# Patient Record
Sex: Female | Born: 1945 | Race: White | Hispanic: No | Marital: Married | Smoking: Former smoker
Health system: Southern US, Community
[De-identification: ages and names within clinical notes are randomized; demographics above are authoritative.]

## PROBLEM LIST (undated history)

## (undated) DIAGNOSIS — J309 Allergic rhinitis, unspecified: Secondary | ICD-10-CM

## (undated) DIAGNOSIS — M949 Disorder of cartilage, unspecified: Secondary | ICD-10-CM

## (undated) DIAGNOSIS — Z803 Family history of malignant neoplasm of breast: Secondary | ICD-10-CM

## (undated) DIAGNOSIS — K5730 Diverticulosis of large intestine without perforation or abscess without bleeding: Secondary | ICD-10-CM

## (undated) DIAGNOSIS — T7840XA Allergy, unspecified, initial encounter: Secondary | ICD-10-CM

## (undated) DIAGNOSIS — G4730 Sleep apnea, unspecified: Secondary | ICD-10-CM

## (undated) DIAGNOSIS — R011 Cardiac murmur, unspecified: Secondary | ICD-10-CM

## (undated) DIAGNOSIS — Z8679 Personal history of other diseases of the circulatory system: Secondary | ICD-10-CM

## (undated) DIAGNOSIS — M8580 Other specified disorders of bone density and structure, unspecified site: Secondary | ICD-10-CM

## (undated) DIAGNOSIS — J189 Pneumonia, unspecified organism: Secondary | ICD-10-CM

## (undated) DIAGNOSIS — M810 Age-related osteoporosis without current pathological fracture: Secondary | ICD-10-CM

## (undated) DIAGNOSIS — I10 Essential (primary) hypertension: Secondary | ICD-10-CM

## (undated) DIAGNOSIS — R911 Solitary pulmonary nodule: Secondary | ICD-10-CM

## (undated) DIAGNOSIS — I272 Pulmonary hypertension, unspecified: Secondary | ICD-10-CM

## (undated) DIAGNOSIS — I4819 Other persistent atrial fibrillation: Principal | ICD-10-CM

## (undated) DIAGNOSIS — D72819 Decreased white blood cell count, unspecified: Secondary | ICD-10-CM

## (undated) DIAGNOSIS — I071 Rheumatic tricuspid insufficiency: Secondary | ICD-10-CM

## (undated) DIAGNOSIS — S42401A Unspecified fracture of lower end of right humerus, initial encounter for closed fracture: Secondary | ICD-10-CM

## (undated) DIAGNOSIS — J329 Chronic sinusitis, unspecified: Secondary | ICD-10-CM

## (undated) DIAGNOSIS — H269 Unspecified cataract: Secondary | ICD-10-CM

## (undated) DIAGNOSIS — I340 Nonrheumatic mitral (valve) insufficiency: Secondary | ICD-10-CM

## (undated) DIAGNOSIS — M899 Disorder of bone, unspecified: Secondary | ICD-10-CM

## (undated) DIAGNOSIS — G4733 Obstructive sleep apnea (adult) (pediatric): Secondary | ICD-10-CM

## (undated) HISTORY — DX: Personal history of other diseases of the circulatory system: Z86.79

## (undated) HISTORY — DX: Sleep apnea, unspecified: G47.30

## (undated) HISTORY — DX: Unspecified fracture of lower end of right humerus, initial encounter for closed fracture: S42.401A

## (undated) HISTORY — DX: Pneumonia, unspecified organism: J18.9

## (undated) HISTORY — DX: Diverticulosis of large intestine without perforation or abscess without bleeding: K57.30

## (undated) HISTORY — DX: Allergy, unspecified, initial encounter: T78.40XA

## (undated) HISTORY — DX: Pulmonary hypertension, unspecified: I27.20

## (undated) HISTORY — DX: Solitary pulmonary nodule: R91.1

## (undated) HISTORY — DX: Rheumatic tricuspid insufficiency: I07.1

## (undated) HISTORY — DX: Chronic sinusitis, unspecified: J32.9

## (undated) HISTORY — DX: Unspecified cataract: H26.9

## (undated) HISTORY — PX: BREAST BIOPSY: SHX20

## (undated) HISTORY — PX: DILATION AND CURETTAGE OF UTERUS: SHX78

## (undated) HISTORY — DX: Other persistent atrial fibrillation: I48.19

## (undated) HISTORY — DX: Other specified disorders of bone density and structure, unspecified site: M85.80

## (undated) HISTORY — DX: Disorder of cartilage, unspecified: M94.9

## (undated) HISTORY — DX: Allergic rhinitis, unspecified: J30.9

## (undated) HISTORY — DX: Decreased white blood cell count, unspecified: D72.819

## (undated) HISTORY — PX: TONSILLECTOMY: SHX5217

## (undated) HISTORY — DX: Age-related osteoporosis without current pathological fracture: M81.0

## (undated) HISTORY — DX: Family history of malignant neoplasm of breast: Z80.3

## (undated) HISTORY — DX: Nonrheumatic mitral (valve) insufficiency: I34.0

## (undated) HISTORY — PX: TUBAL LIGATION: SHX77

## (undated) HISTORY — DX: Essential (primary) hypertension: I10

## (undated) HISTORY — PX: FOOT SURGERY: SHX648

## (undated) HISTORY — DX: Cardiac murmur, unspecified: R01.1

## (undated) HISTORY — DX: Obstructive sleep apnea (adult) (pediatric): G47.33

## (undated) HISTORY — DX: Disorder of bone, unspecified: M89.9

---

## 2003-09-21 ENCOUNTER — Encounter (INDEPENDENT_AMBULATORY_CARE_PROVIDER_SITE_OTHER): Payer: Self-pay | Admitting: Family Medicine

## 2003-09-21 LAB — HM COLONOSCOPY

## 2006-02-18 ENCOUNTER — Ambulatory Visit: Payer: Self-pay | Admitting: Family Medicine

## 2006-03-11 ENCOUNTER — Ambulatory Visit: Payer: Self-pay | Admitting: Family Medicine

## 2007-02-24 ENCOUNTER — Ambulatory Visit: Payer: Self-pay | Admitting: Family Medicine

## 2007-02-24 DIAGNOSIS — J189 Pneumonia, unspecified organism: Secondary | ICD-10-CM | POA: Insufficient documentation

## 2007-02-24 DIAGNOSIS — K5730 Diverticulosis of large intestine without perforation or abscess without bleeding: Secondary | ICD-10-CM | POA: Insufficient documentation

## 2007-02-24 DIAGNOSIS — L988 Other specified disorders of the skin and subcutaneous tissue: Secondary | ICD-10-CM | POA: Insufficient documentation

## 2007-05-03 ENCOUNTER — Ambulatory Visit: Payer: Self-pay | Admitting: Family Medicine

## 2007-05-03 LAB — CONVERTED CEMR LAB
Cholesterol: 175 mg/dL (ref 0–200)
HDL: 65.7 mg/dL (ref >39.0)
TSH: 2.62 microintl units/mL (ref 0.35–5.50)
Total CHOL/HDL Ratio: 2.7

## 2008-02-08 ENCOUNTER — Ambulatory Visit: Payer: Self-pay | Admitting: *Deleted

## 2008-02-08 DIAGNOSIS — R519 Headache, unspecified: Secondary | ICD-10-CM | POA: Insufficient documentation

## 2008-02-08 DIAGNOSIS — R51 Headache: Secondary | ICD-10-CM | POA: Insufficient documentation

## 2008-02-08 DIAGNOSIS — I10 Essential (primary) hypertension: Secondary | ICD-10-CM | POA: Insufficient documentation

## 2008-02-09 LAB — CONVERTED CEMR LAB
Basophils Absolute: 0 10*3/uL (ref 0.0–0.1)
Basophils Relative: 0.4 % (ref 0.0–3.0)
CO2: 31 meq/L (ref 19–32)
Calcium: 9.6 mg/dL (ref 8.4–10.5)
Creatinine, Ser: 0.8 mg/dL (ref 0.4–1.2)
Glucose, Bld: 86 mg/dL (ref 70–99)
Hemoglobin: 14.2 g/dL (ref 12.0–15.0)
Lymphocytes Relative: 32 % (ref 12.0–46.0)
MCHC: 35 g/dL (ref 30.0–36.0)
Monocytes Relative: 8.7 % (ref 3.0–12.0)
Neutro Abs: 2.4 10*3/uL (ref 1.4–7.7)
RBC: 4.59 M/uL (ref 3.87–5.11)

## 2008-02-15 ENCOUNTER — Ambulatory Visit: Payer: Self-pay | Admitting: *Deleted

## 2008-02-15 DIAGNOSIS — D72819 Decreased white blood cell count, unspecified: Secondary | ICD-10-CM | POA: Insufficient documentation

## 2008-03-14 ENCOUNTER — Ambulatory Visit: Payer: Self-pay | Admitting: *Deleted

## 2008-03-14 DIAGNOSIS — R109 Unspecified abdominal pain: Secondary | ICD-10-CM | POA: Insufficient documentation

## 2008-03-14 LAB — CONVERTED CEMR LAB
Blood in Urine, dipstick: NEGATIVE
Ketones, urine, test strip: NEGATIVE
Nitrite: NEGATIVE
Protein, U semiquant: NEGATIVE
Urobilinogen, UA: 0.2

## 2008-03-21 ENCOUNTER — Ambulatory Visit: Payer: Self-pay | Admitting: *Deleted

## 2008-03-21 LAB — CONVERTED CEMR LAB
Basophils Absolute: 0.1 10*3/uL (ref 0.0–0.1)
Basophils Relative: 1.2 % (ref 0.0–3.0)
CO2: 32 meq/L (ref 19–32)
Calcium: 9 mg/dL (ref 8.4–10.5)
Creatinine, Ser: 0.7 mg/dL (ref 0.4–1.2)
Eosinophils Absolute: 0.1 10*3/uL (ref 0.0–0.7)
Hemoglobin: 14.4 g/dL (ref 12.0–15.0)
MCHC: 35.4 g/dL (ref 30.0–36.0)
MCV: 88.3 fL (ref 78.0–100.0)
Neutro Abs: 4.8 10*3/uL (ref 1.4–7.7)
RBC: 4.62 M/uL (ref 3.87–5.11)

## 2008-07-02 ENCOUNTER — Ambulatory Visit: Payer: Self-pay | Admitting: *Deleted

## 2008-07-09 ENCOUNTER — Encounter (INDEPENDENT_AMBULATORY_CARE_PROVIDER_SITE_OTHER): Payer: Self-pay | Admitting: *Deleted

## 2008-07-16 ENCOUNTER — Encounter (INDEPENDENT_AMBULATORY_CARE_PROVIDER_SITE_OTHER): Payer: Self-pay | Admitting: *Deleted

## 2008-07-26 ENCOUNTER — Ambulatory Visit: Payer: Self-pay | Admitting: Internal Medicine

## 2008-07-26 DIAGNOSIS — J018 Other acute sinusitis: Secondary | ICD-10-CM | POA: Insufficient documentation

## 2008-08-15 ENCOUNTER — Ambulatory Visit: Payer: Self-pay | Admitting: *Deleted

## 2008-08-15 DIAGNOSIS — J329 Chronic sinusitis, unspecified: Secondary | ICD-10-CM | POA: Insufficient documentation

## 2008-08-19 DIAGNOSIS — J309 Allergic rhinitis, unspecified: Secondary | ICD-10-CM | POA: Insufficient documentation

## 2008-10-11 ENCOUNTER — Ambulatory Visit: Payer: Self-pay | Admitting: Internal Medicine

## 2008-10-11 DIAGNOSIS — M899 Disorder of bone, unspecified: Secondary | ICD-10-CM | POA: Insufficient documentation

## 2008-10-11 DIAGNOSIS — M949 Disorder of cartilage, unspecified: Secondary | ICD-10-CM

## 2008-10-12 ENCOUNTER — Encounter: Payer: Self-pay | Admitting: Internal Medicine

## 2008-10-12 ENCOUNTER — Telehealth (INDEPENDENT_AMBULATORY_CARE_PROVIDER_SITE_OTHER): Payer: Self-pay | Admitting: *Deleted

## 2008-12-25 ENCOUNTER — Telehealth: Payer: Self-pay | Admitting: Internal Medicine

## 2009-03-25 ENCOUNTER — Ambulatory Visit (HOSPITAL_BASED_OUTPATIENT_CLINIC_OR_DEPARTMENT_OTHER): Admission: RE | Admit: 2009-03-25 | Discharge: 2009-03-25 | Payer: Self-pay | Admitting: Internal Medicine

## 2009-03-25 ENCOUNTER — Ambulatory Visit: Payer: Self-pay | Admitting: Diagnostic Radiology

## 2009-03-25 ENCOUNTER — Telehealth: Payer: Self-pay | Admitting: Internal Medicine

## 2009-03-25 ENCOUNTER — Ambulatory Visit: Payer: Self-pay | Admitting: Internal Medicine

## 2009-03-25 ENCOUNTER — Ambulatory Visit: Payer: Self-pay | Admitting: Cardiology

## 2009-03-25 DIAGNOSIS — R0789 Other chest pain: Secondary | ICD-10-CM | POA: Insufficient documentation

## 2009-03-25 DIAGNOSIS — R011 Cardiac murmur, unspecified: Secondary | ICD-10-CM | POA: Insufficient documentation

## 2009-04-12 ENCOUNTER — Ambulatory Visit: Payer: Self-pay | Admitting: Internal Medicine

## 2009-04-12 ENCOUNTER — Encounter: Payer: Self-pay | Admitting: Cardiology

## 2009-04-12 ENCOUNTER — Ambulatory Visit (HOSPITAL_COMMUNITY): Admission: RE | Admit: 2009-04-12 | Discharge: 2009-04-12 | Payer: Self-pay | Admitting: Internal Medicine

## 2009-04-12 ENCOUNTER — Ambulatory Visit: Payer: Self-pay

## 2009-06-07 ENCOUNTER — Ambulatory Visit (HOSPITAL_BASED_OUTPATIENT_CLINIC_OR_DEPARTMENT_OTHER): Admission: RE | Admit: 2009-06-07 | Discharge: 2009-06-07 | Payer: Self-pay | Admitting: Internal Medicine

## 2009-06-07 ENCOUNTER — Ambulatory Visit: Payer: Self-pay | Admitting: Diagnostic Radiology

## 2009-06-07 ENCOUNTER — Ambulatory Visit: Payer: Self-pay | Admitting: Internal Medicine

## 2009-06-07 ENCOUNTER — Telehealth: Payer: Self-pay | Admitting: Internal Medicine

## 2009-06-07 DIAGNOSIS — R059 Cough, unspecified: Secondary | ICD-10-CM | POA: Insufficient documentation

## 2009-06-07 DIAGNOSIS — R05 Cough: Secondary | ICD-10-CM

## 2009-06-22 DIAGNOSIS — R911 Solitary pulmonary nodule: Secondary | ICD-10-CM

## 2009-06-22 HISTORY — DX: Solitary pulmonary nodule: R91.1

## 2009-06-27 ENCOUNTER — Ambulatory Visit: Payer: Self-pay | Admitting: Diagnostic Radiology

## 2009-06-27 ENCOUNTER — Ambulatory Visit (HOSPITAL_BASED_OUTPATIENT_CLINIC_OR_DEPARTMENT_OTHER): Admission: RE | Admit: 2009-06-27 | Discharge: 2009-06-27 | Payer: Self-pay | Admitting: Internal Medicine

## 2009-06-27 ENCOUNTER — Ambulatory Visit: Payer: Self-pay | Admitting: Internal Medicine

## 2009-06-27 ENCOUNTER — Telehealth: Payer: Self-pay | Admitting: Internal Medicine

## 2009-07-11 ENCOUNTER — Encounter: Payer: Self-pay | Admitting: Internal Medicine

## 2009-07-19 ENCOUNTER — Encounter: Payer: Self-pay | Admitting: Internal Medicine

## 2009-08-02 ENCOUNTER — Ambulatory Visit: Payer: Self-pay | Admitting: Internal Medicine

## 2009-08-02 DIAGNOSIS — J984 Other disorders of lung: Secondary | ICD-10-CM | POA: Insufficient documentation

## 2009-10-22 ENCOUNTER — Ambulatory Visit: Payer: Self-pay | Admitting: Internal Medicine

## 2010-01-27 ENCOUNTER — Ambulatory Visit: Payer: Self-pay | Admitting: Internal Medicine

## 2010-01-27 DIAGNOSIS — H5310 Unspecified subjective visual disturbances: Secondary | ICD-10-CM | POA: Insufficient documentation

## 2010-01-27 LAB — CONVERTED CEMR LAB
BUN: 12 mg/dL (ref 6–23)
CO2: 31 meq/L (ref 19–32)
Calcium: 9.6 mg/dL (ref 8.4–10.5)
Glucose, Bld: 84 mg/dL (ref 70–99)
TSH: 1.974 microintl units/mL (ref 0.350–4.500)

## 2010-01-29 ENCOUNTER — Telehealth: Payer: Self-pay | Admitting: Internal Medicine

## 2010-03-18 ENCOUNTER — Telehealth: Payer: Self-pay | Admitting: Internal Medicine

## 2010-05-06 ENCOUNTER — Telehealth: Payer: Self-pay | Admitting: Internal Medicine

## 2010-05-06 ENCOUNTER — Ambulatory Visit: Payer: Self-pay | Admitting: Diagnostic Radiology

## 2010-05-06 ENCOUNTER — Ambulatory Visit (HOSPITAL_BASED_OUTPATIENT_CLINIC_OR_DEPARTMENT_OTHER): Admission: RE | Admit: 2010-05-06 | Discharge: 2010-05-06 | Payer: Self-pay | Admitting: Internal Medicine

## 2010-05-08 ENCOUNTER — Telehealth: Payer: Self-pay | Admitting: Internal Medicine

## 2010-06-30 ENCOUNTER — Ambulatory Visit
Admission: RE | Admit: 2010-06-30 | Discharge: 2010-06-30 | Payer: Self-pay | Source: Home / Self Care | Attending: Internal Medicine | Admitting: Internal Medicine

## 2010-07-03 ENCOUNTER — Encounter: Payer: Self-pay | Admitting: Internal Medicine

## 2010-07-03 LAB — CONVERTED CEMR LAB
BUN: 9 mg/dL (ref 6–23)
CRP, High Sensitivity: 0.5
Calcium: 9.3 mg/dL (ref 8.4–10.5)
Creatinine, Ser: 0.73 mg/dL (ref 0.40–1.20)
HDL: 72 mg/dL (ref >39)
LDL Cholesterol: 87 mg/dL (ref 0–99)
Triglycerides: 52 mg/dL (ref <150)

## 2010-07-04 ENCOUNTER — Encounter: Payer: Self-pay | Admitting: Internal Medicine

## 2010-07-14 ENCOUNTER — Encounter: Payer: Self-pay | Admitting: Internal Medicine

## 2010-07-15 ENCOUNTER — Encounter: Payer: Self-pay | Admitting: Internal Medicine

## 2010-07-20 LAB — CONVERTED CEMR LAB
AST: 38 units/L — ABNORMAL HIGH (ref 0–37)
Albumin: 4.2 g/dL (ref 3.5–5.2)
Alkaline Phosphatase: 50 units/L (ref 39–117)
BUN: 10 mg/dL (ref 6–23)
Basophils Absolute: 0.1 10*3/uL (ref 0.0–0.1)
Basophils Relative: 1.9 % (ref 0.0–3.0)
Bilirubin, Direct: 0.1 mg/dL (ref 0.0–0.3)
CO2: 31 meq/L (ref 19–32)
Calcium: 9.4 mg/dL (ref 8.4–10.5)
Creatinine, Ser: 0.7 mg/dL (ref 0.4–1.2)
Eosinophils Absolute: 0 10*3/uL (ref 0.0–0.7)
HCT: 41.1 % (ref 36.0–46.0)
Hemoglobin: 14 g/dL (ref 12.0–15.0)
Lymphs Abs: 1.2 10*3/uL (ref 0.7–4.0)
MCHC: 34.2 g/dL (ref 30.0–36.0)
MCV: 88.1 fL (ref 78.0–100.0)
Monocytes Absolute: 0.3 10*3/uL (ref 0.1–1.0)
Neutro Abs: 1.9 10*3/uL (ref 1.4–7.7)
Pap Smear: NORMAL
RDW: 12.2 % (ref 11.5–14.6)
Total Bilirubin: 1 mg/dL (ref 0.3–1.2)
Total CHOL/HDL Ratio: 3
Triglycerides: 59 mg/dL (ref 0.0–149.0)

## 2010-07-22 NOTE — Progress Notes (Signed)
Summary: Additional CT ?  Phone Note Call from Patient   Caller: Patient Call For: D. Thomos Lemons DO Summary of Call: Pt left voice message stating that she had additional questions about her CT result. Left message on machine tor pt to return my call.  Nicki Guadalajara Fergerson CMA Duncan Dull)  May 08, 2010 8:44 AM   Follow-up for Phone Call        Pt called back to question thyroid nodule. States she was never told she had a nodule on her thyroid but was aware of a lung nodule.  Advised pt per radiology report there was no imaging of her thyroid and it was the lung nodules that were stable.  Pt questioned what is a lung nodule and why do they develop.  Advised pt it is a growth on her lung, not sure what causes them. Per report there have been no changes from previous CT and they are stable. Not concerning for cancer at this time.  Nicki Guadalajara Fergerson CMA Duncan Dull)  May 08, 2010 9:14 AM

## 2010-07-22 NOTE — Progress Notes (Signed)
Summary: Hydrochlorithiazide Refill  Phone Note Refill Request Message from:  Fax from Pharmacy on March 18, 2010 1:57 PM  Refills Requested: Medication #1:  HYDROCHLOROTHIAZIDE 12.5 MG  CAPS one cap by mouth once daily   Dosage confirmed as above?Dosage Confirmed   Brand Name Necessary? No   Supply Requested: 3 months   Last Refilled: 12/23/2009  Method Requested: Electronic Next Appointment Scheduled: 06-30-2010 Dr Artist Pais  Initial call taken by: Roselle Locus,  March 18, 2010 1:57 PM  Follow-up for Phone Call        call placed to  patient at [phone] to verify which pharmacy she uses. patients husband, stated she was on another line and he was able to verify [facility] [address] Follow-up by: Glendell Docker CMA,  March 18, 2010 3:01 PM    Prescriptions: HYDROCHLOROTHIAZIDE 12.5 MG  CAPS (HYDROCHLOROTHIAZIDE) one cap by mouth once daily  #90 x 1   Entered by:   Glendell Docker CMA   Authorized by:   D. Thomos Lemons DO   Signed by:   Glendell Docker CMA on 03/18/2010   Method used:   [facility]  [address] [id number]       [address]       [place], [place]  [address]       Ph: [phone] or [phone]       Fax: [phone]   RxID:   [id number]

## 2010-07-22 NOTE — Assessment & Plan Note (Signed)
Summary: 6 month follow up/mhf   Vital Signs:  Patient profile:   65 year old Alvarez Height:      65 inches Weight:      156.50 pounds BMI:     26.14 O2 Sat:      99 % on Room air Temp:     98.1 degrees F oral Pulse rate:   54 / minute Pulse rhythm:   regular Resp:     20 per minute BP sitting:   120 / 70  (right arm) Cuff size:   regular  Vitals Entered By: Glendell Docker CMA (January 27, 2010 10:35 AM)  O2 Flow:  Room air CC: Rm 2- 6 Month Follow up  Is Patient Diabetic? No Pain Assessment Patient in pain? no       Does patient need assistance? Functional Status Self care Ambulation Normal Comments no concerns   Primary Care Provider:  DThomos Lemons DO  CC:  Rm 2- 6 Month Follow up .  History of Present Illness: 65 y/o white Alvarez for f/u she c/o intermittent headaches more noticeable near temporal area her sinuses still not clear AM congestion  pulm nodule - no new pulm symptoms  htn - stable  she c/o prominent bone and end of collar bone.  prev eval by ortho.  not sure when MRI was done  Preventive Screening-Counseling & Management  Alcohol-Tobacco     Smoking Status: quit  Allergies: 1)  ! Naprosyn 2)  ! Cipro  Past History:  Past Medical History: Current Problems:  HYPERTENSION, BENIGN ESSENTIAL (ICD-401.1) OSTEOPENIA (ICD-733.90)  ALLERGIC RHINITIS (ICD-477.9)     SINUSITIS (ICD-473.9) LEUKOCYTOPENIA UNSPECIFIED (ICD-288.50)  PNEUMONIA (ICD-486) DISORDER, SKIN NEC (ICD-709.8) FAMILY HISTORY BREAST CANCER 1ST DEGREE RELATIVE <50 (ICD-V16.3) DIVERTICULOSIS, COLON (ICD-562.10) heart murmur - dx as child - s/p rheumatic fever  Small subpleual nodules  - CT of chest 06/2009  -4 mm subpleural nodule in the superior segment of the right lower  lobe   Past Surgical History: Foot Surgery Tubal ligation  D & C tonsillectomy   breast biopsy - benign       Social History: Married  2 children Quit tobacco 1976 - smoked 2 ppd x  15 Retired- Producer, television/film/video  Alcohol Use - yes         Physical Exam  General:  alert, well-developed, and well-nourished.   Lungs:  normal respiratory effort, normal breath sounds, no crackles, and no wheezes.   Heart:  normal rate, regular rhythm, and no gallop.   Abdomen:  soft, non-tender, and normal bowel sounds.   Msk:  left AC joint prominence / cyst Extremities:  No lower extremity edema  Psych:  normally interactive, good eye contact, not anxious appearing, and not depressed appearing.     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   we discussed risks of missing lung cancer  Problem # 2:  HYPERTENSION, BENIGN ESSENTIAL (ICD-401.1) Assessment: Unchanged  Her updated medication list for this problem includes:    Hydrochlorothiazide 12.5 Mg Caps (Hydrochlorothiazide) ..... One cap by mouth once daily  Orders: T-Basic Metabolic Panel [phone]) T-TSH [phone])  BP today: 120/70 Prior BP: 114/70 (10/22/2009)  Prior 10 Yr Risk Heart Disease: 3 % (10/11/2008)  Labs Reviewed: K+: 4.3 (10/11/2008) Creat: : 0.7 (10/11/2008)   Chol: 184 (10/11/2008)   HDL: 59.80 (10/11/2008)   LDL: 112 (10/11/2008)   TG: 59.0 (  10/11/2008)  Problem # 3:  VISUAL CHANGES (ICD-368.10)  she reports intermittent visual changes on left eye.  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 2 sprays each nostril once daily  Complete Medication List: 1)  Centrum Tabs (Multiple vitamins-minerals) .... Take 1 tablet by mouth once a day 2)  Fish Oil 1200 Mg  Caps (Omega-3 fatty acids) .... 2 tabs daily 3)  Calcium 500 Mg Tabs (Calcium) .... Take 1 tablet by mouth two times a day 4)  Hydrochlorothiazide 12.5 Mg Caps (Hydrochlorothiazide) .... One cap by mouth once daily 5)  Estrace 0.1 Mg/gm Crea (Estradiol) .... As directed 6)  Zostavax 28413 Unt/0.30ml Solr (Zoster vaccine live) .... Administer vaccine x 1 7)  Nasonex 50 Mcg/act Susp (Mometasone furoate) .... 2 sprays each nostril once daily  Patient Instructions: 1)  Please schedule a follow-up appointment in 6 months. 2)  Forward copy of previous left shoulder MRI to our office Prescriptions: NASONEX 50 MCG/ACT SUSP (MOMETASONE FUROATE) 2 sprays each nostril once daily  #3 month x 1   Entered and Authorized by:   D. Thomos Lemons DO   Signed by:   D. Thomos Lemons DO on 01/27/2010   Method used:   [facility]  [address] [id number]       [address]       [place], [place]  [address]       Ph: [phone] or [phone]       Fax: [phone]   RxID:   [id number] HYDROCHLOROTHIAZIDE 12.5 MG  CAPS (HYDROCHLOROTHIAZIDE) one cap by mouth once daily  #90 x 1   Entered and Authorized by:   D. Thomos Lemons DO   Signed by:   D. Thomos Lemons DO on 01/27/2010   Method used:   [facility]        [facility]  [address] [id number]       [address]       [place], [place]  [address]       Ph: [phone] or [phone]       Fax: [phone]   RxID:   [id number] NASONEX 50 MCG/ACT SUSP (MOMETASONE FUROATE) 2 sprays each nostril once daily  #1 x 5   Entered and Authorized by:   D. Thomos Lemons DO   Signed by:   D. Thomos Lemons DO on 01/27/2010   Method used:   [facility]        [facility]  [address] [id number]       [address]       [place], [place]  [address]       Ph: [phone] or [phone]       Fax: [phone]   RxID:   [id number]   Current Allergies (reviewed today): !  NAPROSYN ! CIPRO

## 2010-07-22 NOTE — Progress Notes (Signed)
Summary: CT Results  Phone Note Outgoing Call   Summary of Call: call pt - CT of chest confirms resolution of prev pna.  thyroid nodule is stable.   no further surveillance CT scans recommended Initial call taken by: D. Thomos Lemons DO,  May 06, 2010 2:59 PM  Follow-up for Phone Call        call placed to patient at [phone] , no answer. A detailed voice message was left informing patient per Dr Artist Pais instructions. Message was left for patient to call if any questions Follow-up by: Glendell Docker CMA,  May 07, 2010 9:31 AM

## 2010-07-22 NOTE — Progress Notes (Signed)
  Phone Note Call from Patient   Caller: Patient Summary of Call: Pt. was seen today by Dr.Yoo and states that she went to pick her Rx.(Avelox) up from the pharmacy and states that it will cost her over 300 dollars to get this med. She would like something cheaper be called in for her. Call pt. back and let her know [phone] Initial call taken by: Michaelle Copas,  June 27, 2009 11:01 AM  Follow-up for Phone Call        pt. called back and stated she was wrong about the cost of her Avelox. It is going to cost her 125, and that is fine with her so Dr.Yoo can just leave that med. called in for her and she will go pick it up. Thank you Follow-up by: Michaelle Copas,  June 27, 2009 11:38 AM

## 2010-07-22 NOTE — Assessment & Plan Note (Signed)
Summary: 1 month follow up/mhf   Vital Signs:  Patient profile:   65 year old female Weight:      153 pounds BMI:     26.36 O2 Sat:      98 % on Room air Temp:     97.7 degrees F oral Pulse rate:   54 / minute Pulse rhythm:   regular Resp:     16 per minute BP sitting:   120 / 80  (right arm) Cuff size:   regular  Vitals Entered By: Glendell Docker CMA (August 02, 2009 10:19 AM)  O2 Flow:  Room air  Primary Care Provider:  D. Thomos Lemons DO  CC:  1 Month Follow up.  History of Present Illness: 1 Month Follow up   65 y/o white female with prev pna for f/u she finished abx as directed. avelox was hard to take.  it caused some dizziness but she finished rx mild intermittent cough but overall much better  prev CT of chest 06/27/2009 reviewed:   Persistent patchy density at the right base posteriorly consistent with persistent pneumonia.  No sign of an underlying or associated mass lesion.   4 mm subpleural nodule in the superior segment of the right lower lobe on image 22.  If the patient is at low risk of bronchogenic carcinoma, follow-up in 12 months would be suggested to ensure stability.  If the patient is at high risk, 79-month follow-up would be suggested.  Allergies: 1)  ! Naprosyn 2)  ! Cipro  Past History:  Past Medical History: Current Problems:  HYPERTENSION, BENIGN ESSENTIAL (ICD-401.1) OSTEOPENIA (ICD-733.90)  ALLERGIC RHINITIS (ICD-477.9)   SINUSITIS (ICD-473.9) LEUKOCYTOPENIA UNSPECIFIED (ICD-288.50) PNEUMONIA (ICD-486) DISORDER, SKIN NEC (ICD-709.8) FAMILY HISTORY BREAST CANCER 1ST DEGREE RELATIVE <50 (ICD-V16.3) DIVERTICULOSIS, COLON (ICD-562.10) heart murmur - dx as child - s/p rheumatic fever  Past Surgical History: Foot Surgery Tubal ligation  D & C tonsillectomy breast biopsy - benign      Family History: Lung cancer - mother - smoker CABG - mother, age 5 COPD - father - smoker Family History High cholesterol Family History  Hypertension         Social History: Married  2 children Quit tobacco 1976 - smoked 2 ppd x 15 Retired- Producer, television/film/video  Alcohol Use - yes      Physical Exam  General:  alert, well-developed, and well-nourished.   Lungs:  normal respiratory effort, normal breath sounds, no crackles, and no wheezes.   Heart:  normal rate, regular rhythm, and no gallop.     Impression & Recommendations:  Problem # 1:  PNEUMONIA (ICD-486) Assessment Improved She still has intermittent cough.  worse in AM.  I suspect reflux may be trigger.  trial of OTC zantac 150 two times a day x 2 weeks  The following medications were removed from the medication list:    Avelox 400 Mg Tabs (Moxifloxacin hcl) ..... 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  she was smoker.  repeat CT of chest in 1 yr  Future Orders: Radiology Referral (Radiology) ... 01/30/2010  Complete Medication List: 1)  Centrum Tabs (Multiple vitamins-minerals) .... Take 1 tablet by mouth once a day 2)  Fish Oil 1200 Mg Caps (Omega-3 fatty acids) .... 2 tabs daily 3)  Calcium Antacid Ultra Max St 1000 Mg Chew (Calcium carbonate antacid) .... 2 daily 4)  Hydrochlorothiazide 12.5 Mg Caps (  Hydrochlorothiazide) .... One cap by mouth once daily 5)  Estrace 0.1 Mg/gm Crea (Estradiol) .... As directed 6)  Ranitidine Hcl 150 Mg Caps (Ranitidine hcl) .... One by mouth two times a day 7)  Zostavax [address] Unt/0.69ml Solr (Zoster vaccine live) .... Administer vaccine x 1  Patient Instructions: 1)  Please schedule a follow-up appointment in 6 months. Prescriptions: ZOSTAVAX 60454 UNT/0.65ML SOLR (ZOSTER VACCINE LIVE) administer vaccine x 1  #1 x 0   Entered and Authorized by:   D. Thomos Lemons DO   Signed by:   D. Thomos Lemons DO on 08/02/2009   Method used:   Print then Give to Patient   RxID:   [id number]   Current Allergies (reviewed today): ! NAPROSYN !  CIPRO

## 2010-07-22 NOTE — Progress Notes (Signed)
Summary: Blood test results  Phone Note Call from Patient   Caller: Patient Reason for Call: Talk to Nurse Summary of Call: Pt wants to know the results of her blood tests, pls call her Thurs to let her know when they will be avail Initial call taken by: Lannette Donath,  January 29, 2010 3:39 PM  Follow-up for Phone Call        call pt - sed rate (marker of inflammation) was normal.  electrolytes, kidney function and thyroid function also normal Follow-up by: D. Thomos Lemons DO,  February 03, 2010 9:29 AM  Additional Follow-up for Phone Call Additional follow up Details #1::        attempted to contact patient at [phone], no answer. A detailed voice message was left informing patient per Dr Artist Pais instructions. Message left for patient to call if any questions Additional Follow-up by: Glendell Docker CMA,  February 03, 2010 4:33 PM

## 2010-07-22 NOTE — Assessment & Plan Note (Signed)
Summary: CONGESTION/DT   Vital Signs:  Patient profile:   65 year old female Height:      65 inches Weight:      154 pounds BMI:     25.72 O2 Sat:      98 % on Room air Temp:     98.1 degrees F oral Pulse rate:   62 / minute Pulse rhythm:   regular Resp:     18 per minute BP sitting:   114 / 70  (right arm) Cuff size:   regular  Vitals Entered By: Glendell Docker CMA (Oct 22, 2009 10:23 AM)  O2 Flow:  Room air CC: Rm 2- Congestion Is Patient Diabetic? No   Primary Care Provider:  Dondra Spry DO  CC:  Rm 2- Congestion.  History of Present Illness: 65 y/o female c/ o head congestion and productive cough for the past month  Zyrtec taken with no relief, no fever, nasal drainage, some wheezing, throat irritation.  sinus congestion with yellowish mucus  Preventive Screening-Counseling & Management  Alcohol-Tobacco     Smoking Status: quit  Allergies: 1)  ! Naprosyn 2)  ! Cipro  Past History:  Past Medical History: Current Problems:  HYPERTENSION, BENIGN ESSENTIAL (ICD-401.1) OSTEOPENIA (ICD-733.90)  ALLERGIC RHINITIS (ICD-477.9)    SINUSITIS (ICD-473.9) LEUKOCYTOPENIA UNSPECIFIED (ICD-288.50)  PNEUMONIA (ICD-486) DISORDER, SKIN NEC (ICD-709.8) FAMILY HISTORY BREAST CANCER 1ST DEGREE RELATIVE <50 (ICD-V16.3) DIVERTICULOSIS, COLON (ICD-562.10) heart murmur - dx as child - s/p rheumatic fever  Past Surgical History: Foot Surgery Tubal ligation  D & C tonsillectomy  breast biopsy - benign       Family History: Lung cancer - mother - smoker CABG - mother, age 78 COPD - father - smoker Family History High cholesterol Family History Hypertension           Social History: Married  2 children Quit tobacco 1976 - smoked 2 ppd x 15 Retired- Producer, television/film/video  Alcohol Use - yes        Physical Exam  General:  alert, well-developed, and well-nourished.   Ears:  R ear normal and L ear normal.   Mouth:  pharyngeal erythema and postnasal drip.   Neck:  No  deformities, masses, or tenderness noted. Lungs:  normal respiratory effort, normal breath sounds, no crackles, and no wheezes.   Heart:  normal rate, regular rhythm, and no gallop.     Impression & Recommendations:  Problem # 1:  RHINOSINUSITIS, ACUTE (ICD-461.8)  Her updated medication list for this problem includes:    Amoxicillin-pot Clavulanate 500-125 Mg Tabs (Amoxicillin-pot clavulanate) ..... One by mouth two times a day  Instructed on treatment. Call if symptoms persist or worsen.   Orders: Prescription Created Electronically [phone])  Complete Medication List: 1)  Centrum Tabs (Multiple vitamins-minerals) .... Take 1 tablet by mouth once a day 2)  Fish Oil 1200 Mg Caps (Omega-3 fatty acids) .... 2 tabs daily 3)  Calcium Antacid Ultra Max St 1000 Mg Chew (Calcium carbonate antacid) .... 2 daily 4)  Hydrochlorothiazide 12.5 Mg Caps (Hydrochlorothiazide) .... One cap by mouth once daily 5)  Estrace 0.1 Mg/gm Crea (Estradiol) .... As directed 6)  Zostavax 94854 Unt/0.5ml Solr (Zoster vaccine live) .... Administer vaccine x 1 7)  Amoxicillin-pot Clavulanate 500-125 Mg Tabs (Amoxicillin-pot clavulanate) .... One by mouth two times a day  Patient Instructions: 1)  Continue to use nasal saline irrigation 2)  Ok to use mucinex 3)  Call our office if your symptoms do not  improve or gets worse. Prescriptions: AMOXICILLIN-POT CLAVULANATE 500-125 MG TABS (AMOXICILLIN-POT CLAVULANATE) one by mouth two times a day  #20 x 0   Entered and Authorized by:   D. Thomos Lemons DO   Signed by:   D. Thomos Lemons DO on 10/22/2009   Method used:   Electronically to        [id number]* (retail)       [address] [place], [place]  [address]       Ph: [phone]       Fax: [phone]   RxID:   [id number]   Current Allergies (reviewed today): ! NAPROSYN ! CIPRO

## 2010-07-22 NOTE — Assessment & Plan Note (Signed)
Summary: 3 month follow up/mhf   Vital Signs:  Patient profile:   65 year old female Weight:      153 pounds BMI:     26.36 O2 Sat:      97 % on Room air Temp:     97.9 degrees F oral Pulse rate:   61 / minute Pulse rhythm:   regular Resp:     18 per minute BP sitting:   140 / 70  (right arm) Cuff size:   regular  Vitals Entered By: Glendell Docker CMA (June 27, 2009 8:36 AM)  O2 Flow:  Room air  Primary Care Provider:  D. Thomos Lemons DO  CC:  3 Month Follow up.  History of Present Illness: 3 month Foolow up   65 y/o white female for follow up re:  pna.   she c/o unresolved productive cough with left side rib cage pain no fever or chills no SOB she finished full course of abx - ceftin and azithromycin  Htn - stable  Preventive Screening-Counseling & Management  Alcohol-Tobacco     Smoking Status: quit  Allergies: 1)  ! Naprosyn 2)  ! Cipro  Past History:  Past Medical History: Current Problems:  HYPERTENSION, BENIGN ESSENTIAL (ICD-401.1) OSTEOPENIA (ICD-733.90)  ALLERGIC RHINITIS (ICD-477.9)  SINUSITIS (ICD-473.9) LEUKOCYTOPENIA UNSPECIFIED (ICD-288.50) PNEUMONIA (ICD-486) DISORDER, SKIN NEC (ICD-709.8) FAMILY HISTORY BREAST CANCER 1ST DEGREE RELATIVE <50 (ICD-V16.3) DIVERTICULOSIS, COLON (ICD-562.10) heart murmur - dx as child - s/p rheumatic fever  Past Surgical History: Foot Surgery Tubal ligation  D & C tonsillectomy breast biopsy - benign     Family History: Lung cancer - mother - smoker CABG - mother, age 19 COPD - father - smoker Family History High cholesterol Family History Hypertension       Social History: Married  2 children Quit tobacco 1976 - smoked 2 ppd x 15 Retired- Producer, television/film/video  Alcohol Use - yes   Review of Systems       The patient complains of prolonged cough.  The patient denies fever, hemoptysis, weight loss, and weight gain.    Physical Exam  General:  alert, well-developed, and well-nourished.   Lungs:   slight coarse breath sounds at right base, normal respiratory effort and no wheezes.   Heart:  normal rate, regular rhythm, and no gallop.   Abdomen:  soft, non-tender, and normal bowel sounds.   Extremities:  No lower extremity edema  Neurologic:  cranial nerves II-XII intact and gait normal.     Impression & Recommendations:  Problem # 1:  PNEUMONIA (ICD-486) Assessment Unchanged  Pt with persistent cough.   She has crackles at right base.  Obtain CT of chest - rule out endobronchial lesion.  The following medications were removed from the medication list:    Cefuroxime Axetil 500 Mg Tabs (Cefuroxime axetil) ..... One by mouth two times a day    Azithromycin 250 Mg Tabs (Azithromycin) .Marland Kitchen... 2 tabs on day one, then one by mouth once daily Her updated medication list for this problem includes:    Avelox 400 Mg Tabs (Moxifloxacin hcl) ..... One by mouth once daily  Orders: CT without Contrast (CT w/o contrast)  Problem # 2:  HYPERTENSION, BENIGN ESSENTIAL (ICD-401.1) Maintain current medication regimen.  Her updated medication list for this problem includes:    Hydrochlorothiazide 12.5 Mg Caps (Hydrochlorothiazide) ..... One cap by mouth once daily  BP today: 140/70 Prior BP: 116/60 (06/07/2009)  Prior 10 Yr Risk Heart Disease: 3 % (10/11/2008)  Labs Reviewed: K+: 4.3 (10/11/2008) Creat: : 0.7 (10/11/2008)   Chol: 184 (10/11/2008)   HDL: 59.80 (10/11/2008)   LDL: 112 (10/11/2008)   TG: 59.0 (10/11/2008)  Complete Medication List: 1)  Centrum Tabs (Multiple vitamins-minerals) .... Take 1 tablet by mouth once a day 2)  Fish Oil 1200 Mg Caps (Omega-3 fatty acids) .... 2 tabs daily 3)  Calcium Antacid Ultra Max St 1000 Mg Chew (Calcium carbonate antacid) .... 2 daily 4)  Hydrochlorothiazide 12.5 Mg Caps (Hydrochlorothiazide) .... One cap by mouth once daily 5)  Estrace 0.1 Mg/gm Crea (Estradiol) .... As directed 6)  Benzonatate 100 Mg Caps (Benzonatate) .... One by mouth three  times a day as needed cough 7)  Avelox 400 Mg Tabs (Moxifloxacin hcl) .... One by mouth once daily 8)  Symbicort 80-4.5 Mcg/act Aero (Budesonide-formoterol fumarate) .... 2 puffs two times a day  Patient Instructions: 1)  Please schedule a follow-up appointment in 1 month. Prescriptions: BENZONATATE 100 MG CAPS (BENZONATATE) one by mouth three times a day as needed cough  #30 x 0   Entered and Authorized by:   D. Thomos Lemons DO   Signed by:   D. Thomos Lemons DO on 06/27/2009   Method used:   [facility]  [address] [id number]       [address]       [place], [place]  [address]       Ph: [phone] or [phone]       Fax: [phone]   RxID:   [id number] AVELOX 400 MG TABS (MOXIFLOXACIN HCL) one by mouth once daily  #10 x 0   Entered and Authorized by:   D. Thomos Lemons DO   Signed by:   D. Thomos Lemons DO on 06/27/2009   Method used:   [facility]  [address] [id number]       [address]       [place], [place]  [address]       Ph: [phone] or [phone]       Fax: [phone]   RxID:   [id number]       Current Allergies (reviewed today): ! NAPROSYN ! CIPRO

## 2010-07-22 NOTE — Miscellaneous (Signed)
Summary: Mammogram  Clinical Lists Changes  Observations: Added new observation of MAMMOGRAM: normal (07/11/2009 13:51)      Preventive Care Screening  Mammogram:    Date:  07/11/2009    Results:  normal

## 2010-07-24 NOTE — Letter (Signed)
   [place] [facility] [address] [place], [place]  [address]  [place] Phone: [phone]      July 04, 2010   Erin Alvarez [address] [place], [place] [address]  RE:  LAB RESULTS  Dear  [name],  The following is an interpretation of your most recent lab tests.  Please take note of any instructions provided or changes to medications that have resulted from your lab work.  ELECTROLYTES:  Good - no changes needed  KIDNEY FUNCTION TESTS:  Good - no changes needed  LIPID PANEL:  Good - no changes needed Triglyceride: 52   Cholesterol: 169   LDL: 87   HDL: 72   Chol/HDL%:  2.3 Ratio   C Reactive Protein - normal       Sincerely Yours,    Dr. Thomos Lemons  Appended Document:  mailed

## 2010-07-24 NOTE — Assessment & Plan Note (Signed)
Summary: 6 month follow up/mhf   Vital Signs:  Patient profile:   65 year old female Height:      65 inches Weight:      160.50 pounds BMI:     26.81 O2 Sat:      100 % on Room air Temp:     97.8 degrees F oral Pulse rate:   55 / minute Resp:     16 per minute BP sitting:   116 / 64  (right arm) Cuff size:   large  Vitals Entered By: Glendell Docker CMA (June 30, 2010 10:00 AM)  O2 Flow:  Room air CC: 6 Month Follow up  Is Patient Diabetic? No Pain Assessment Patient in pain? no      Comments no fasting, no concerns, insurance does not cover Zostavax, request refill on hctz for 90 days   Primary Care Provider:  Dondra Spry DO  CC:  6 Month Follow up .  History of Present Illness:  Hypertension Follow-Up      This is a 65 year old woman who presents for Hypertension follow-up.  The patient denies lightheadedness, headaches, and edema.  The patient denies the following associated symptoms: chest pain.  Compliance with medications (by patient report) has been near 100%.  The patient reports that dietary compliance has been fair.    Pulm nodule:  1.  Resolution of right lower lobe pneumonia since prior exam. 2.  Stable right lung nodules, largest in the superior segment of the right lower lobe measuring 4 mm.  No further imaging followup is necessary.    Preventive Screening-Counseling & Management  Alcohol-Tobacco     Smoking Status: quit  Allergies: 1)  ! Naprosyn 2)  ! Cipro  Past History:  Past Medical History: Current Problems:  HYPERTENSION, BENIGN ESSENTIAL (ICD-401.1) OSTEOPENIA (ICD-733.90)   ALLERGIC RHINITIS (ICD-477.9)     SINUSITIS (ICD-473.9) LEUKOCYTOPENIA UNSPECIFIED (ICD-288.50)  PNEUMONIA (ICD-486) DISORDER, SKIN NEC (ICD-709.8) FAMILY HISTORY BREAST CANCER 1ST DEGREE RELATIVE <50 (ICD-V16.3) DIVERTICULOSIS, COLON (ICD-562.10) heart murmur - dx as child - s/p rheumatic fever  Small subpleual nodules  - CT of chest 06/2009  -4 mm  subpleural nodule in the superior segment of the right lower  lobe   Past Surgical History: Foot Surgery Tubal ligation  D & C tonsillectomy   breast biopsy - benign        Family History: Lung cancer - mother - smoker CABG - mother, age 22 COPD - father - smoker Family History High cholesterol Family History Hypertension            Social History: Married  2 children Quit tobacco 1976 - smoked 2 ppd x 15 Retired- Producer, television/film/video  Alcohol Use - yes        (son coming in overseas - living in [place])  Physical Exam  General:  alert, well-developed, and well-nourished.   Lungs:  normal respiratory effort, normal breath sounds, no crackles, and no wheezes.   Heart:  normal rate, regular rhythm, and no gallop.     Impression & Recommendations:  Problem # 1:  HYPERTENSION, BENIGN ESSENTIAL (ICD-401.1) Assessment Unchanged  Her updated medication list for this problem includes:    Hydrochlorothiazide 12.5 Mg Caps (Hydrochlorothiazide) ..... One cap by mouth once daily  Orders: T-Basic Metabolic Panel [id number]) T-Lipid Profile [phone]) CRP, high sensitivity-[facility] [phone])  BP today: 116/64 Prior BP: 120/70 (01/27/2010)  Prior 10 Yr Risk Heart Disease: 3 % (10/11/2008)  Labs Reviewed: K+: 4.2 (01/27/2010)  Creat: : 0.74 (01/27/2010)   Chol: 184 (10/11/2008)   HDL: 59.80 (10/11/2008)   LDL: 112 (10/11/2008)   TG: 59.0 (10/11/2008)  Problem # 2:  PULMONARY NODULE, RIGHT LOWER LOBE (ICD-518.89) 1.  Resolution of right lower lobe pneumonia since prior exam. 2.  Stable right lung nodules, largest in the superior segment of the right lower lobe measuring 4 mm.  No further imaging followup is necessary.    Complete Medication List: 1)  Centrum Tabs (Multiple vitamins-minerals) .... Take 1 tablet by mouth once a day 2)  Fish Oil 1200 Mg Caps (Omega-3 fatty acids) .... 2 tabs daily 3)  Calcium 500 Mg Tabs (Calcium) .... Take 1 tablet by mouth two times a  day 4)  Hydrochlorothiazide 12.5 Mg Caps (Hydrochlorothiazide) .... One cap by mouth once daily 5)  Estrace 0.1 Mg/gm Crea (Estradiol) .... As directed 6)  Nasonex 50 Mcg/act Susp (Mometasone furoate) .... 2 sprays each nostril once daily  Patient Instructions: 1)  Please schedule a follow-up appointment in 1 year. 2)  Ask your GYN about screening bone density scan. Prescriptions: HYDROCHLOROTHIAZIDE 12.5 MG  CAPS (HYDROCHLOROTHIAZIDE) one cap by mouth once daily  #90 x 3   Entered and Authorized by:   D. Thomos Lemons DO   Signed by:   D. Thomos Lemons DO on 06/30/2010   Method used:   Electronically to        [facility]  [address] [id number] ([facility])       [address]       [place], [place]  [address]       Ph: [phone] or [phone]       Fax: [phone]   RxID:   [id number]    Orders Added: 1)  T-Basic Metabolic Panel [phone] 2)  T-Lipid Profile [phone] 3)  CRP, high sensitivity-[facility] [phone] 4)  Est. Patient Level III [[address]]   Immunization History:  Influenza Immunization History:    Influenza:  declined (06/30/2010)   Contraindications/Deferment of Procedures/Staging:    Test/Procedure: FLU VAX    Reason for deferment: patient declined     Test/Procedure: Zoster vaccine    Reason for deferment: declined   Immunization History:  Influenza Immunization History:    Influenza:  Declined (06/30/2010)  Current Allergies (reviewed today): ! NAPROSYN ! CIPRO

## 2010-07-24 NOTE — Miscellaneous (Signed)
Summary: Mammogram  Clinical Lists Changes  Observations: Added new observation of MAMMOGRAM: normal (07/14/2010 16:58)      Preventive Care Screening  Mammogram:    Date:  07/14/2010    Results:  normal

## 2010-08-07 ENCOUNTER — Telehealth: Payer: Self-pay | Admitting: Internal Medicine

## 2010-08-13 NOTE — Progress Notes (Signed)
Summary: request additional dx code  Phone Note Call from Patient   Caller: Loney Loh  [phone] ext [phone] Call For: D. Thomos Lemons DO Summary of Call: Meliton Rattan called from [facility] requesting additional diagnosis code for CRP from 07-03-10. 401.1 and 786.59 will not cover this test.  Native coronary Artery Disease is the only code that will cover the test. Please adivise if code is appropriate for pt? Initial call taken by: Mervin Kung CMA Duncan Dull),  August 07, 2010 9:28 AM  Follow-up for Phone Call        she has had chest pain in the past  786.59 is appropriate Follow-up by: D. Thomos Lemons DO,  August 07, 2010 5:47 PM  Additional Follow-up for Phone Call Additional follow up Details #1::        Notified Meliton Rattan that we have no additional codes to provide for CRP testing. Nicki Guadalajara Fergerson CMA (AAMA)  August 08, 2010 2:08 PM

## 2010-09-17 ENCOUNTER — Other Ambulatory Visit: Payer: Self-pay | Admitting: Internal Medicine

## 2010-09-19 NOTE — Telephone Encounter (Signed)
Spoke with Zollie Scale rx on file from Januray 2012

## 2011-01-20 ENCOUNTER — Encounter: Payer: Self-pay | Admitting: Internal Medicine

## 2011-05-23 ENCOUNTER — Emergency Department (INDEPENDENT_AMBULATORY_CARE_PROVIDER_SITE_OTHER): Payer: Medicare Other

## 2011-05-23 ENCOUNTER — Encounter (HOSPITAL_BASED_OUTPATIENT_CLINIC_OR_DEPARTMENT_OTHER): Payer: Self-pay | Admitting: *Deleted

## 2011-05-23 ENCOUNTER — Emergency Department (HOSPITAL_BASED_OUTPATIENT_CLINIC_OR_DEPARTMENT_OTHER)
Admission: EM | Admit: 2011-05-23 | Discharge: 2011-05-23 | Disposition: A | Discharge status: Home / Self Care | Payer: Medicare Other | Attending: Emergency Medicine | Admitting: Emergency Medicine

## 2011-05-23 DIAGNOSIS — M25529 Pain in unspecified elbow: Secondary | ICD-10-CM

## 2011-05-23 DIAGNOSIS — W19XXXA Unspecified fall, initial encounter: Secondary | ICD-10-CM

## 2011-05-23 DIAGNOSIS — S46919A Strain of unspecified muscle, fascia and tendon at shoulder and upper arm level, unspecified arm, initial encounter: Secondary | ICD-10-CM

## 2011-05-23 DIAGNOSIS — S1093XA Contusion of unspecified part of neck, initial encounter: Secondary | ICD-10-CM

## 2011-05-23 DIAGNOSIS — T148XXA Other injury of unspecified body region, initial encounter: Secondary | ICD-10-CM

## 2011-05-23 DIAGNOSIS — S0003XA Contusion of scalp, initial encounter: Secondary | ICD-10-CM | POA: Insufficient documentation

## 2011-05-23 DIAGNOSIS — S52123A Displaced fracture of head of unspecified radius, initial encounter for closed fracture: Secondary | ICD-10-CM | POA: Insufficient documentation

## 2011-05-23 DIAGNOSIS — I10 Essential (primary) hypertension: Secondary | ICD-10-CM | POA: Insufficient documentation

## 2011-05-23 DIAGNOSIS — S42401A Unspecified fracture of lower end of right humerus, initial encounter for closed fracture: Secondary | ICD-10-CM

## 2011-05-23 DIAGNOSIS — W108XXA Fall (on) (from) other stairs and steps, initial encounter: Secondary | ICD-10-CM | POA: Insufficient documentation

## 2011-05-23 DIAGNOSIS — M25519 Pain in unspecified shoulder: Secondary | ICD-10-CM

## 2011-05-23 HISTORY — DX: Unspecified fracture of lower end of right humerus, initial encounter for closed fracture: S42.401A

## 2011-05-23 MED ORDER — HYDROCODONE-ACETAMINOPHEN 5-500 MG PO TABS: 1.0000 | ORAL_TABLET | Freq: Four times a day (QID) | ORAL | Status: AC | PRN

## 2011-05-23 MED ORDER — OXYCODONE-ACETAMINOPHEN 5-325 MG PO TABS: 1.0000 | ORAL_TABLET | Freq: Once | ORAL | Status: DC

## 2011-05-23 MED ORDER — ACETAMINOPHEN 325 MG PO TABS
650.0000 mg | ORAL_TABLET | Freq: Once | ORAL | Status: AC
  Administered 2011-05-23: 650 mg via ORAL
  Filled 2011-05-23: qty 2

## 2011-05-23 NOTE — ED Provider Notes (Signed)
History     CSN: [id number] Arrival date & time: 05/23/2011  1:36 PM   First MD Initiated Contact with Patient 05/23/11 1429      Chief Complaint  Patient presents with  . Fall    (Consider location/radiation/quality/duration/timing/severity/associated sxs/prior treatment) HPI Comments: Pt states that she missed a step and fell:no dizziness or loc associated with fall  Patient is a 65 y.o. female presenting with fall. The history is provided by the patient. No language interpreter was used.  Fall The accident occurred 1 to 2 hours ago. The fall occurred while standing. She landed on a hard floor. The point of impact was the head, right elbow and right shoulder. The pain is present in the right elbow, head and right shoulder. The pain is moderate. She was ambulatory at the scene. There was no entrapment after the fall. There was no drug use involved in the accident. There was no alcohol use involved in the accident. Pertinent negatives include no numbness, no bowel incontinence, no nausea, no vomiting, no hearing loss and no loss of consciousness. The symptoms are aggravated by activity.    Past Medical History  Diagnosis Date  . Essential hypertension, benign   . Disorder of bone and cartilage, unspecified   . Allergic rhinitis, cause unspecified   . Unspecified sinusitis (chronic)   . Leukocytopenia, unspecified   . Pneumonia, organism unspecified   . Other specified disorder of skin   . Family history of malignant neoplasm of breast   . Diverticulosis of colon (without mention of hemorrhage)   . Heart murmur     dx as child - s/p rheumatic fever  . Lung nodule 06/2009    CT of chest, 4 mm subpleural nodule in the superior segment of the right lower lobe    Past Surgical History  Procedure Date  . Foot surgery   . Tubal ligation   . Tonsillectomy   . Breast surgery     biopsy - benign  . Dilation and curettage of uterus     Family History  Problem Relation Age of  Onset  . Cancer Mother     lung, smoker  . Heart disease Mother 37    CABG  . COPD Father     smoker  . Hyperlipidemia Other   . Hypertension Other     History  Substance Use Topics  . Smoking status: Former Smoker -- 2.0 packs/day for 15 years    Quit date: 06/22/1974  . Smokeless tobacco: Not on file  . Alcohol Use: Yes    OB History    Grav Para Term Preterm Abortions TAB SAB Ect Mult Living                  Review of Systems  Gastrointestinal: Negative for nausea, vomiting and bowel incontinence.  Neurological: Negative for loss of consciousness and numbness.    Allergies  Ciprofloxacin and Naproxen  Home Medications   Current Outpatient Rx  Name Route Sig Dispense Refill  . CALCIUM 500 PO Oral Take 1 tablet by mouth daily.      Marland Kitchen ESTRADIOL 0.1 MG/GM VA CREA  as directed.      Marland Kitchen HYDROCHLOROTHIAZIDE 12.5 MG PO TABS Oral Take 12.5 mg by mouth daily.      . MOMETASONE FUROATE 50 MCG/ACT NA SUSP Nasal Place 2 sprays into the nose daily.      . CENTRUM PO Oral Take 1 tablet by mouth daily.      Marland Kitchen  FISH OIL 1200 MG PO CAPS Oral Take 2 capsules by mouth daily.        BP 165/67  Pulse 69  Temp(Src) 98.3 F (36.8 C) (Oral)  Resp 18  Ht 5\' 5"  (1.651 m)  Wt 168 lb (76.204 kg)  BMI 27.96 kg/m2  SpO2 100%  Physical Exam  Nursing note and vitals reviewed. Constitutional: She appears well-developed and well-nourished.  HENT:       Hematoma to the right forehead  Eyes: Conjunctivae and EOM are normal. Pupils are equal, round, and reactive to light.  Neck: Normal range of motion. Neck supple.  Cardiovascular: Normal rate and regular rhythm.   Pulmonary/Chest: Effort normal and breath sounds normal.  Abdominal: Soft. Bowel sounds are normal.  Musculoskeletal:       Pt is tender to the right elbow with palpation:no gross deformity noted:pt unable to fully extend the area:pt tender in the right shoulder without gross deformity  Neurological: She is alert.  Skin:        Hematoma to forehead  Psychiatric: She has a normal mood and affect.    ED Course  Procedures (including critical care time)  Labs Reviewed - No data to display Dg Shoulder Right  05/23/2011  *RADIOLOGY REPORT*  Clinical Data: The [facility] joint is intact and the subacromial space is maintained.  There is no evidence of fracture or dislocation.  RIGHT SHOULDER - 2+ VIEW  Comparison: Normal right shoulder.  Findings: The [facility] joint is intact and the subacromial space is maintained.  There is no evidence of fracture or dislocation.  IMPRESSION: Normal right shoulder.  Original Report Authenticated By: Brandon Melnick, M.D.   Dg Elbow Complete Right  05/23/2011  *RADIOLOGY REPORT*  Clinical Data: Pain after fall  RIGHT ELBOW - COMPLETE 3+ VIEW  Comparison: None.  Findings: There is a nondisplaced proximal radial fracture and a small elbow joint effusion.  IMPRESSION: Nondisplaced radial head fracture.  Original Report Authenticated By: Brandon Melnick, M.D.   Ct Head Wo Contrast  05/23/2011  *RADIOLOGY REPORT*  Clinical Data: Fall and hematoma.  CT HEAD WITHOUT CONTRAST  Technique:  Contiguous axial images were obtained from the base of the skull through the vertex without contrast.  Comparison: None.  Findings: No evidence for acute hemorrhage, mass lesion, midline shift, hydrocephalus or large infarct.  The paranasal sinuses and mastoid air cells are clear.  No evidence for a skull fracture.  IMPRESSION: No acute intracranial abnormality.  Original Report Authenticated By: Richarda Overlie, M.D.     1. Radial head fracture   2. Hematoma   3. Fall   4. Shoulder strain       MDM  Pt splinted by nursing staff:pt states that she has a orthopedist in [place] that she can follow up with   Medical screening examination/treatment/procedure(s) were conducted as a shared visit with non-physician practitioner(s) and myself.  I personally evaluated the patient during the encounter Pt tripped and fell,  hitting her forehead, and injuring the right elbow and right shoulder. Exam shows a contusion on the left forehead, mild tenderness over the right humeral head, and significant tenderness and swelling of the right elbow, with pain on pronation and supination and extension.  X-rays showed right radial head fracture, were otherwise negative. Osvaldo Human, M.D.     Teressa Lower, NP 05/23/11 1600  Carleene Cooper III, MD 05/23/11 2015

## 2011-05-23 NOTE — ED Notes (Signed)
Care plan reviewed with pt with follow up reviewed

## 2011-05-23 NOTE — ED Notes (Signed)
Pt states she stepped back and fell about 2 hours ago. C/O pain to right elbow, upper arm and shoulder. Also knot on head. No LOC. PERL

## 2011-05-23 NOTE — ED Notes (Signed)
Hematoma to R side of forehead decreased mobility to R elbow no gross deformity

## 2011-06-29 ENCOUNTER — Encounter: Payer: Self-pay | Admitting: Internal Medicine

## 2011-06-30 ENCOUNTER — Encounter: Payer: Self-pay | Admitting: Internal Medicine

## 2011-07-16 ENCOUNTER — Ambulatory Visit: Payer: Medicare Other | Attending: Orthopedic Surgery | Admitting: Physical Therapy

## 2011-07-16 DIAGNOSIS — M25519 Pain in unspecified shoulder: Secondary | ICD-10-CM | POA: Insufficient documentation

## 2011-07-16 DIAGNOSIS — M25619 Stiffness of unspecified shoulder, not elsewhere classified: Secondary | ICD-10-CM | POA: Insufficient documentation

## 2011-07-16 DIAGNOSIS — IMO0001 Reserved for inherently not codable concepts without codable children: Secondary | ICD-10-CM | POA: Insufficient documentation

## 2011-07-20 ENCOUNTER — Ambulatory Visit: Payer: Medicare Other | Admitting: Physical Therapy

## 2011-07-22 ENCOUNTER — Ambulatory Visit: Payer: Medicare Other | Admitting: Physical Therapy

## 2011-07-23 ENCOUNTER — Ambulatory Visit: Payer: Medicare Other | Admitting: Physical Therapy

## 2011-07-29 ENCOUNTER — Ambulatory Visit: Payer: Medicare Other | Attending: Orthopedic Surgery | Admitting: Physical Therapy

## 2011-07-29 DIAGNOSIS — M25619 Stiffness of unspecified shoulder, not elsewhere classified: Secondary | ICD-10-CM | POA: Insufficient documentation

## 2011-07-29 DIAGNOSIS — M25519 Pain in unspecified shoulder: Secondary | ICD-10-CM | POA: Insufficient documentation

## 2011-07-29 DIAGNOSIS — IMO0001 Reserved for inherently not codable concepts without codable children: Secondary | ICD-10-CM | POA: Insufficient documentation

## 2011-08-04 ENCOUNTER — Ambulatory Visit: Payer: Medicare Other | Admitting: Physical Therapy

## 2011-08-12 ENCOUNTER — Ambulatory Visit: Payer: Medicare Other | Admitting: Physical Therapy

## 2011-08-14 ENCOUNTER — Encounter: Payer: Medicare Other | Admitting: Physical Therapy

## 2011-08-14 ENCOUNTER — Ambulatory Visit: Payer: Medicare Other | Admitting: Physical Therapy

## 2011-09-21 ENCOUNTER — Encounter: Payer: Self-pay | Admitting: Physician Assistant

## 2011-10-12 ENCOUNTER — Encounter: Payer: Self-pay | Admitting: Physician Assistant

## 2011-10-12 ENCOUNTER — Ambulatory Visit (INDEPENDENT_AMBULATORY_CARE_PROVIDER_SITE_OTHER): Payer: Medicare Other | Admitting: Physician Assistant

## 2011-10-12 VITALS — BP 120/80 | HR 60 | Ht 65.0 in | Wt 170.4 lb

## 2011-10-12 DIAGNOSIS — I10 Essential (primary) hypertension: Secondary | ICD-10-CM

## 2011-10-12 DIAGNOSIS — R002 Palpitations: Secondary | ICD-10-CM

## 2011-10-12 DIAGNOSIS — R011 Cardiac murmur, unspecified: Secondary | ICD-10-CM

## 2011-10-12 LAB — BASIC METABOLIC PANEL
BUN: 13 mg/dL (ref 6–23)
Chloride: 108 mEq/L (ref 96–112)
Creatinine, Ser: 0.7 mg/dL (ref 0.4–1.2)

## 2011-10-12 NOTE — Assessment & Plan Note (Signed)
Stable

## 2011-10-12 NOTE — Assessment & Plan Note (Signed)
History of rheumatic fever as a child with moderate TR on echo in January 2012. We will repeat.

## 2011-10-12 NOTE — Assessment & Plan Note (Signed)
Patient had 2 episodes of significant palpitations that lasted for approximately 2 hours each. She is currently wearing a number of record her which will be turned in May 3 but she has had no events since she's been wearing it. We will order a stress echo to reassess. I told her to go to the emergency room if she has another episode so we can document what her arrhythmia is. She denied any dizziness or presyncope with the arrhythmia. We will check a BMP to make sure her potassium is stable. Her TSH was normal in December.

## 2011-10-12 NOTE — Progress Notes (Signed)
HPI:  This is a very pleasant 66 year old white female patient who is referred to [place] from Dr. Laurann Montana for assessment of palpitations. The patient had 2 episodes in the past month or significant palpitations. One episode awakened her from sleep and the other occurred while eating. They each lasted about 2 hours. She describes it as a rapid pounding that was irregular and she felt like her heart was moving within her chest. She denied any chest pain, radiation and her neck or arm, dizziness, or presyncope.These episodes occurred 2 weeks apart. She was under a lot of stress as she was trying to handle both her and the states. Her thyroid was normal in December 2012 as well as her other labs. She denies using any caffeine, excess alcohol, or over-the-counter medications. She was given an event recorder but has not had any episodes she is she's been wearing it. She does not exercise regularly.  Patient was seen in her office back in January 2012 by Dr. Jens Som because of a history of a heart murmur rheumatic fever as a child. She also was having some atypical chest pain then. Stress echo was done and was normal ejection fraction 60-65% with mild to moderate tricuspid regurgitation and mild mitral regurgitation.  Allergies  Allergen Reactions  . Ciprofloxacin   . Naproxen     Current Outpatient Prescriptions on File Prior to Visit  Medication Sig Dispense Refill  . estradiol (ESTRACE) 0.1 MG/GM vaginal cream as directed.        . mometasone (NASONEX) 50 MCG/ACT nasal spray Place 2 sprays into the nose daily.        . Multiple Vitamins-Minerals (CENTRUM PO) Take 1 tablet by mouth daily.        . Omega-3 Fatty Acids (FISH OIL) 1200 MG CAPS Take 2 capsules by mouth daily.          Past Medical History  Diagnosis Date  . Essential hypertension, benign   . Disorder of bone and cartilage, unspecified   . Allergic rhinitis, cause unspecified   . Unspecified sinusitis (chronic)   . Leukocytopenia,  unspecified   . Pneumonia, organism unspecified   . Other specified disorder of skin   . Family history of malignant neoplasm of breast   . Diverticulosis of colon (without mention of hemorrhage)   . Heart murmur     dx as child - s/p rheumatic fever  . Lung nodule 06/2009    CT of chest, 4 mm subpleural nodule in the superior segment of the right lower lobe    Past Surgical History  Procedure Date  . Foot surgery   . Tubal ligation   . Tonsillectomy   . Breast surgery     biopsy - benign  . Dilation and curettage of uterus     Family History  Problem Relation Age of Onset  . Cancer Mother     lung, smoker  . Heart disease Mother 48    CABG  . COPD Father     smoker  . Hyperlipidemia Other   . Hypertension Other     History   Social History  . Marital Status: Married    Spouse Name: Richard    Number of Children: 2  . Years of Education: N/A   Occupational History  . Retired Interior and spatial designer    Social History Main Topics  . Smoking status: Former Smoker -- 2.0 packs/day for 15 years    Quit date: 06/22/1974  . Smokeless tobacco: Not on file  .  Alcohol Use: Yes  . Drug Use:   . Sexually Active:    Other Topics Concern  . Not on file   Social History Narrative   Married2 childrenQuit tobacco 1976 - smoked 2 ppd x 15Retired - hair dresserAlcohol use: yesSon coming in overseas - living in [place]    ROS:see history of present illness otherwise negative unless stated. Patient had broken her arm within the past year and has had chronic pain in her shoulder and right arm that interrupts her sleep.   PHYSICAL EXAM: Well-nournished, in no acute distress. Neck: No JVD, HJR, Bruit, or thyroid enlargement Lungs: No tachypnea, clear without wheezing, rales, or rhonchi Cardiovascular: RRR, PMI not displaced, 2/6 systolic murmur at the left sternal border and apex, no gallops, bruit, thrill, or heave. Abdomen: BS normal. Soft without organomegaly, masses, lesions or  tenderness. Extremities: without cyanosis, clubbing or edema. Good distal pulses bilateral SKin: Warm, no lesions or rashes  Musculoskeletal: No deformities Neuro: no focal signs  BP 120/80  Pulse 60  Ht 5\' 5"  (1.651 m)  Wt 170 lb 6.4 oz (77.293 kg)  BMI 28.36 kg/m2   EXB:MWUXLK sinus rhythm no acute change

## 2011-10-12 NOTE — Patient Instructions (Signed)
Your physician has requested that you have a stress echocardiogram. For further information please visit [email or web address] Please follow instruction sheet as given.  Your physician recommends that you have blood work today:  BMET; 401.1  Your physician recommends that you schedule a follow-up appointment in: 2-3 weeks with Dr. Jens Som, after completion of stress echo.  Your physician recommends that you continue on your current medications as directed. Please refer to the Current Medication list given to you today.

## 2011-10-26 ENCOUNTER — Other Ambulatory Visit (HOSPITAL_COMMUNITY): Payer: Self-pay | Admitting: Radiology

## 2011-10-26 DIAGNOSIS — R002 Palpitations: Secondary | ICD-10-CM

## 2011-10-27 ENCOUNTER — Ambulatory Visit (HOSPITAL_BASED_OUTPATIENT_CLINIC_OR_DEPARTMENT_OTHER): Payer: Medicare Other

## 2011-10-27 ENCOUNTER — Encounter: Payer: Self-pay | Admitting: Cardiology

## 2011-10-27 ENCOUNTER — Other Ambulatory Visit (HOSPITAL_COMMUNITY): Payer: Medicare Other

## 2011-10-27 ENCOUNTER — Encounter (HOSPITAL_COMMUNITY): Payer: Self-pay | Admitting: *Deleted

## 2011-10-27 ENCOUNTER — Ambulatory Visit (HOSPITAL_COMMUNITY): Payer: Medicare Other | Attending: Cardiology

## 2011-10-27 DIAGNOSIS — R0989 Other specified symptoms and signs involving the circulatory and respiratory systems: Secondary | ICD-10-CM

## 2011-10-27 DIAGNOSIS — R079 Chest pain, unspecified: Secondary | ICD-10-CM | POA: Insufficient documentation

## 2011-10-27 DIAGNOSIS — I369 Nonrheumatic tricuspid valve disorder, unspecified: Secondary | ICD-10-CM

## 2011-10-27 DIAGNOSIS — I059 Rheumatic mitral valve disease, unspecified: Secondary | ICD-10-CM

## 2011-10-27 DIAGNOSIS — R002 Palpitations: Secondary | ICD-10-CM | POA: Insufficient documentation

## 2011-10-27 DIAGNOSIS — I10 Essential (primary) hypertension: Secondary | ICD-10-CM | POA: Insufficient documentation

## 2011-10-27 DIAGNOSIS — R011 Cardiac murmur, unspecified: Secondary | ICD-10-CM | POA: Insufficient documentation

## 2011-10-30 ENCOUNTER — Other Ambulatory Visit (HOSPITAL_COMMUNITY): Payer: Medicare Other

## 2011-11-04 ENCOUNTER — Ambulatory Visit (INDEPENDENT_AMBULATORY_CARE_PROVIDER_SITE_OTHER): Payer: Medicare Other | Admitting: Cardiology

## 2011-11-04 ENCOUNTER — Encounter: Payer: Self-pay | Admitting: Cardiology

## 2011-11-04 VITALS — BP 130/72 | HR 64 | Ht 65.0 in | Wt 169.0 lb

## 2011-11-04 DIAGNOSIS — I10 Essential (primary) hypertension: Secondary | ICD-10-CM

## 2011-11-04 DIAGNOSIS — I079 Rheumatic tricuspid valve disease, unspecified: Secondary | ICD-10-CM

## 2011-11-04 DIAGNOSIS — I071 Rheumatic tricuspid insufficiency: Secondary | ICD-10-CM

## 2011-11-04 DIAGNOSIS — R002 Palpitations: Secondary | ICD-10-CM

## 2011-11-04 NOTE — Assessment & Plan Note (Signed)
Blood pressure controlled. Continue present medications. 

## 2011-11-04 NOTE — Patient Instructions (Signed)
Your physician recommends that you schedule a follow-up appointment in: July  Your physician has requested that you have an echocardiogram. Echocardiography is a painless test that uses sound waves to create images of your heart. It provides your doctor with information about the size and shape of your heart and how well your heart's chambers and valves are working. This procedure takes approximately one hour. There are no restrictions for this procedure.

## 2011-11-04 NOTE — Assessment & Plan Note (Signed)
Previous echocardiogram showed mild to moderate tricuspid regurgitation. Plan repeat echo.

## 2011-11-04 NOTE — Assessment & Plan Note (Signed)
Patient has had no further palpitations. Her symptoms sound potentially to have been SVT or atrial fibrillation. She had no symptoms while wearing her monitor. We will obtain those results from primary care. She states a TSH in December was normal. We will plan to follow for now. If she has recurrent palpitations I have encouraged her to go to the closest hospital, urgent care or fire station to get either an electrocardiogram or rhythm strip for documentation. We will treat based on results.

## 2011-11-04 NOTE — Progress Notes (Signed)
HPI: 66 year old female for followup of palpitations. I saw the patient in October of 2010 for atypical chest pain. A stress echocardiogram at that time was normal. An echocardiogram showed normal LV function, mild left atrial enlargement, mild mitral regurgitation and mild to moderate tricuspid regurgitation. Patient seen in April of 2013 by Wynell Balloon. Stress echocardiogram in April of 2013 was interpreted as needed further input regarding the inferior wall by Dr. Myrtis Ser. I reviewed this and felt it was normal. Patient apparently had a monitor with results not available. Since she was seen previously, the patient has dyspnea with more extreme activities but not with routine activities. It is relieved with rest. It is not associated with chest pain. There is no orthopnea, PND or pedal edema. There is no syncope or palpitations. There is no exertional chest pain. Note she did wear a monitor for one month but had no symptoms.    Current Outpatient Prescriptions  Medication Sig Dispense Refill  . aspirin 325 MG tablet Take 325 mg by mouth daily.      Marland Kitchen estradiol (ESTRACE) 0.1 MG/GM vaginal cream as directed.        Marland Kitchen losartan-hydrochlorothiazide (HYZAAR) 50-12.5 MG per tablet Take 1 tablet by mouth daily.      . mometasone (NASONEX) 50 MCG/ACT nasal spray Place 2 sprays into the nose as needed.       . Multiple Vitamins-Minerals (CENTRUM PO) Take 1 tablet by mouth daily.        . Omega-3 Fatty Acids (FISH OIL) 1200 MG CAPS Take 2 capsules by mouth daily.           Past Medical History  Diagnosis Date  . Essential hypertension, benign   . Disorder of bone and cartilage, unspecified   . Allergic rhinitis, cause unspecified   . Unspecified sinusitis (chronic)   . Leukocytopenia, unspecified   . Pneumonia, organism unspecified   . Other specified disorder of skin   . Family history of malignant neoplasm of breast   . Diverticulosis of colon (without mention of hemorrhage)   . Heart murmur       dx as child - s/p rheumatic fever  . Lung nodule 06/2009    CT of chest, 4 mm subpleural nodule in the superior segment of the right lower lobe    Past Surgical History  Procedure Date  . Foot surgery   . Tubal ligation   . Tonsillectomy   . Breast surgery     biopsy - benign  . Dilation and curettage of uterus     History   Social History  . Marital Status: Married    Spouse Name: Richard    Number of Children: 2  . Years of Education: N/A   Occupational History  . Retired Interior and spatial designer    Social History Main Topics  . Smoking status: Former Smoker -- 2.0 packs/day for 15 years    Quit date: 06/22/1974  . Smokeless tobacco: Not on file  . Alcohol Use: Yes  . Drug Use:   . Sexually Active:    Other Topics Concern  . Not on file   Social History Narrative   Married2 childrenQuit tobacco 1976 - smoked 2 ppd x 15Retired - hair dresserAlcohol use: yesSon coming in overseas - living in [place]    ROS: no fevers or chills, productive cough, hemoptysis, dysphasia, odynophagia, melena, hematochezia, dysuria, hematuria, rash, seizure activity, orthopnea, PND, pedal edema, claudication. Remaining systems are negative.  Physical Exam: Well-developed well-nourished in no acute  distress.  Skin is warm and dry.  HEENT is normal.  Neck is supple.  Chest is clear to auscultation with normal expansion.  Cardiovascular exam is regular rate and rhythm.  Abdominal exam nontender or distended. No masses palpated. Extremities show no edema. neuro grossly intact

## 2011-11-05 ENCOUNTER — Ambulatory Visit: Payer: Medicare Other | Admitting: Cardiology

## 2011-11-11 ENCOUNTER — Other Ambulatory Visit: Payer: Self-pay

## 2011-11-11 ENCOUNTER — Ambulatory Visit (HOSPITAL_COMMUNITY): Payer: Medicare Other | Attending: Cardiovascular Disease

## 2011-11-11 DIAGNOSIS — R002 Palpitations: Secondary | ICD-10-CM

## 2011-11-11 DIAGNOSIS — I059 Rheumatic mitral valve disease, unspecified: Secondary | ICD-10-CM | POA: Insufficient documentation

## 2011-11-11 DIAGNOSIS — I517 Cardiomegaly: Secondary | ICD-10-CM

## 2011-11-11 DIAGNOSIS — I10 Essential (primary) hypertension: Secondary | ICD-10-CM | POA: Insufficient documentation

## 2011-11-11 DIAGNOSIS — I079 Rheumatic tricuspid valve disease, unspecified: Secondary | ICD-10-CM | POA: Insufficient documentation

## 2011-11-12 ENCOUNTER — Telehealth: Payer: Self-pay | Admitting: Cardiology

## 2011-11-12 NOTE — Telephone Encounter (Signed)
Spoke with pt, aware of test results. 

## 2011-11-12 NOTE — Telephone Encounter (Signed)
Pt rtn debra's call [phone]

## 2011-12-30 ENCOUNTER — Encounter: Payer: Self-pay | Admitting: Cardiology

## 2011-12-30 ENCOUNTER — Ambulatory Visit (INDEPENDENT_AMBULATORY_CARE_PROVIDER_SITE_OTHER): Payer: Medicare Other | Admitting: Cardiology

## 2011-12-30 VITALS — BP 115/68 | HR 64 | Ht 65.0 in | Wt 170.0 lb

## 2011-12-30 DIAGNOSIS — I10 Essential (primary) hypertension: Secondary | ICD-10-CM

## 2011-12-30 DIAGNOSIS — I071 Rheumatic tricuspid insufficiency: Secondary | ICD-10-CM

## 2011-12-30 DIAGNOSIS — R002 Palpitations: Secondary | ICD-10-CM

## 2011-12-30 DIAGNOSIS — I079 Rheumatic tricuspid valve disease, unspecified: Secondary | ICD-10-CM

## 2011-12-30 MED ORDER — METOPROLOL SUCCINATE ER 25 MG PO TB24: 25.0000 mg | ORAL_TABLET | Freq: Every day | ORAL | Status: DC | PRN

## 2011-12-30 NOTE — Patient Instructions (Addendum)
Your physician wants you to follow-up in: 6 MONTHS WITH DR Jens Som IN [place] You will receive a reminder letter in the mail two months in advance. If you don't receive a letter, please call our office to schedule the follow-up appointment.   START METOPROLOL SUCC 25 MG ONCE DAILY AS NEEDED

## 2011-12-30 NOTE — Assessment & Plan Note (Signed)
Patient has had no further sustained palpitations. Her symptoms sound potentially to have been SVT or atrial fibrillation. She had no symptoms while wearing her previous monitor. She states a TSH in December was normal. We will plan to follow for now. If she has recurrent palpitations I have encouraged her to go to the closest hospital, urgent care or fire station to get either an electrocardiogram or rhythm strip for documentation. We will treat based on results. She is traveling to daily for a visit with her son. I have given her a prescription for Toprol 25 mg by mouth daily as needed.

## 2011-12-30 NOTE — Assessment & Plan Note (Signed)
followup echocardiogram shows no significant tricuspid regurgitation.

## 2011-12-30 NOTE — Progress Notes (Signed)
HPI: Pleasant female for followup of palpitations and abnormal echo. I saw the patient in October of 2010 for atypical chest pain. A stress echocardiogram at that time was normal. An echocardiogram showed normal LV function, mild left atrial enlargement, mild mitral regurgitation and mild to moderate tricuspid regurgitation. Patient seen in April of 2013 by Wynell Balloon. Stress echocardiogram in April of 2013 was interpreted as needed further input regarding the inferior wall by Dr. Myrtis Ser. I reviewed this and felt it was normal. Patient apparently had a monitor with results not available. Repeat echocardiogram in May of 2013 showed an ejection fraction of 60-65%, grade 2 diastolic dysfunction, mild left atrial enlargement. Since I last saw her, she had a brief flutter but no sustained palpitations. There is no dyspnea, chest pain or syncope.   Current Outpatient Prescriptions  Medication Sig Dispense Refill  . aspirin 325 MG tablet Take 325 mg by mouth daily.      Marland Kitchen estradiol (ESTRACE) 0.1 MG/GM vaginal cream as directed.        Marland Kitchen losartan-hydrochlorothiazide (HYZAAR) 50-12.5 MG per tablet Take 1 tablet by mouth daily.      . mometasone (NASONEX) 50 MCG/ACT nasal spray Place 2 sprays into the nose as needed.       . Multiple Vitamins-Minerals (CENTRUM PO) Take 1 tablet by mouth daily.        . Omega-3 Fatty Acids (FISH OIL) 1200 MG CAPS Take 2 capsules by mouth daily.           Past Medical History  Diagnosis Date  . Essential hypertension, benign   . Disorder of bone and cartilage, unspecified   . Allergic rhinitis, cause unspecified   . Unspecified sinusitis (chronic)   . Leukocytopenia, unspecified   . Pneumonia, organism unspecified   . Other specified disorder of skin   . Family history of malignant neoplasm of breast   . Diverticulosis of colon (without mention of hemorrhage)   . Heart murmur     dx as child - s/p rheumatic fever  . Lung nodule 06/2009    CT of chest, 4 mm  subpleural nodule in the superior segment of the right lower lobe    Past Surgical History  Procedure Date  . Foot surgery   . Tubal ligation   . Tonsillectomy   . Breast surgery     biopsy - benign  . Dilation and curettage of uterus     History   Social History  . Marital Status: Married    Spouse Name: Richard    Number of Children: 2  . Years of Education: N/A   Occupational History  . Retired Interior and spatial designer    Social History Main Topics  . Smoking status: Former Smoker -- 2.0 packs/day for 15 years    Quit date: 06/22/1974  . Smokeless tobacco: Not on file  . Alcohol Use: Yes  . Drug Use:   . Sexually Active:    Other Topics Concern  . Not on file   Social History Narrative   Married2 childrenQuit tobacco 1976 - smoked 2 ppd x 15Retired - hair dresserAlcohol use: yesSon coming in overseas - living in [place]    ROS: no fevers or chills, productive cough, hemoptysis, dysphasia, odynophagia, melena, hematochezia, dysuria, hematuria, rash, seizure activity, orthopnea, PND, pedal edema, claudication. Remaining systems are negative.  Physical Exam: Well-developed well-nourished in no acute distress.  Skin is warm and dry.  HEENT is normal.  Neck is supple.  Chest is clear to  auscultation with normal expansion.  Cardiovascular exam is regular rate and rhythm.  Abdominal exam nontender or distended. No masses palpated. Extremities show no edema. neuro grossly intact  Sinus rhythm at a rate of 64. Left anterior fascicular block. RV conduction delay.

## 2011-12-30 NOTE — Assessment & Plan Note (Signed)
Blood pressure controlled. Continue present medications. 

## 2012-05-04 ENCOUNTER — Encounter (HOSPITAL_COMMUNITY): Payer: Self-pay | Admitting: Emergency Medicine

## 2012-05-04 ENCOUNTER — Emergency Department (HOSPITAL_COMMUNITY)
Admission: EM | Admit: 2012-05-04 | Discharge: 2012-05-04 | Disposition: A | Discharge status: Home / Self Care | Payer: Medicare Other | Attending: Emergency Medicine | Admitting: Emergency Medicine

## 2012-05-04 ENCOUNTER — Emergency Department (HOSPITAL_COMMUNITY): Payer: Medicare Other

## 2012-05-04 DIAGNOSIS — I4891 Unspecified atrial fibrillation: Secondary | ICD-10-CM

## 2012-05-04 DIAGNOSIS — Z87891 Personal history of nicotine dependence: Secondary | ICD-10-CM | POA: Insufficient documentation

## 2012-05-04 DIAGNOSIS — Z862 Personal history of diseases of the blood and blood-forming organs and certain disorders involving the immune mechanism: Secondary | ICD-10-CM | POA: Insufficient documentation

## 2012-05-04 DIAGNOSIS — R002 Palpitations: Secondary | ICD-10-CM

## 2012-05-04 DIAGNOSIS — Z7982 Long term (current) use of aspirin: Secondary | ICD-10-CM | POA: Insufficient documentation

## 2012-05-04 DIAGNOSIS — I480 Paroxysmal atrial fibrillation: Secondary | ICD-10-CM

## 2012-05-04 DIAGNOSIS — I4819 Other persistent atrial fibrillation: Secondary | ICD-10-CM | POA: Diagnosis present

## 2012-05-04 DIAGNOSIS — Z8719 Personal history of other diseases of the digestive system: Secondary | ICD-10-CM | POA: Insufficient documentation

## 2012-05-04 DIAGNOSIS — Z8701 Personal history of pneumonia (recurrent): Secondary | ICD-10-CM | POA: Insufficient documentation

## 2012-05-04 DIAGNOSIS — Z8739 Personal history of other diseases of the musculoskeletal system and connective tissue: Secondary | ICD-10-CM | POA: Insufficient documentation

## 2012-05-04 DIAGNOSIS — I10 Essential (primary) hypertension: Secondary | ICD-10-CM

## 2012-05-04 DIAGNOSIS — Z9109 Other allergy status, other than to drugs and biological substances: Secondary | ICD-10-CM | POA: Insufficient documentation

## 2012-05-04 HISTORY — DX: Other persistent atrial fibrillation: I48.19

## 2012-05-04 LAB — BASIC METABOLIC PANEL
Calcium: 9.7 mg/dL (ref 8.4–10.5)
GFR calc Af Amer: >90 mL/min (ref >90)
GFR calc non Af Amer: 87 mL/min — ABNORMAL LOW (ref >90)
Sodium: 139 mEq/L (ref 135–145)

## 2012-05-04 LAB — CBC WITH DIFFERENTIAL/PLATELET
Basophils Absolute: 0.1 10*3/uL (ref 0.0–0.1)
Basophils Relative: 1 % (ref 0–1)
Eosinophils Absolute: 0 10*3/uL (ref 0.0–0.7)
Eosinophils Relative: 1 % (ref 0–5)
Lymphocytes Relative: 29 % (ref 12–46)
MCH: 29.6 pg (ref 26.0–34.0)
MCV: 86.1 fL (ref 78.0–100.0)
Platelets: 208 10*3/uL (ref 150–400)
RDW: 12.7 % (ref 11.5–15.5)
WBC: 5.7 10*3/uL (ref 4.0–10.5)

## 2012-05-04 LAB — PROTIME-INR
INR: 0.93 (ref 0.00–1.49)
Prothrombin Time: 12.4 seconds (ref 11.6–15.2)

## 2012-05-04 MED ORDER — METOPROLOL SUCCINATE ER 25 MG PO TB24: 25.0000 mg | ORAL_TABLET | Freq: Every day | ORAL | Status: DC

## 2012-05-04 MED ORDER — DILTIAZEM HCL 100 MG IV SOLR: 5.0000 mg/h | INTRAVENOUS | Status: DC

## 2012-05-04 MED ORDER — POTASSIUM CHLORIDE CRYS ER 20 MEQ PO TBCR
40.0000 meq | EXTENDED_RELEASE_TABLET | Freq: Once | ORAL | Status: AC
  Administered 2012-05-04: 40 meq via ORAL
  Filled 2012-05-04: qty 2

## 2012-05-04 MED ORDER — DILTIAZEM HCL 25 MG/5ML IV SOLN
10.0000 mg | Freq: Once | INTRAVENOUS | Status: AC
  Administered 2012-05-04: 10 mg via INTRAVENOUS
  Filled 2012-05-04: qty 5

## 2012-05-04 MED ORDER — SODIUM CHLORIDE 0.9 % IV BOLUS (SEPSIS)
1000.0000 mL | Freq: Once | INTRAVENOUS | Status: AC
  Administered 2012-05-04: 1000 mL via INTRAVENOUS

## 2012-05-04 NOTE — Consult Note (Signed)
Consult Note   Patient ID: Erin Alvarez MRN: [id number], DOB/AGE: 1946/03/29   Admit date: 05/04/2012 Date of Consult: 05/04/2012   Primary Physician: Erin Bradford, MD Primary Cardiologist: Erin Millers, MD  HPI: [name] is a 66yo female with PMHx s/f HTN and history of rheumatic fever who presents from her PCP's office with tachy-palpitations and lightheadedness.   She was seen in October 2010 for atypical chest pain for which a stress echo was normal revealing preserved LVEF, mild LAE, mild MR and mild-mod TR. She was seen again in 09/2011 by Erin Alvarez where an additional stress echo was performed for jaw pain waking her from sleep in the setting of tachy-palpitations. Repeat echo in 10/2011 revealed LVEF 60-65%, grade 2 diastolic dysfunction, mild LAE, no significant valvular abnormalities. She has been followed by Dr. Jens Alvarez (most recently 12/2011) for palpitations which had been in remission. TSH in 05/2011 apparently normal. She was given a prescription of Toprol-XL PRN for palpitations, and advised to present to the ED/urgent care/fire station for recurrent palpitations.   She had been stable since this time. She was in her USOH when she experienced sudden onset tachy-palpitations around 1:30 PM today. She reports associated lightheadedness. She notes transient, self-limiting 1-2 minute jaw pain. She denies chest pain, left arm pain, nausea, vomiting, diaphoresis, syncope, SOB, n/v/d, fevers, chills. No increased caffeine, EtOH or elicit drug use. She presented to her PCP's office where an EKG was ordered revealing a-fib w/ RVR and she was driven to the ED.  There, EKG and telemetry confirmed atrial fibrillation w/ RVR. No ischemic changes. BMET reveals hypokalemia at 3.1. CBC and POC trop-I WNL. CXR w/o acute process.   Problem List: Past Medical History  Diagnosis Date  . Essential hypertension, benign   . Disorder of bone and cartilage, unspecified   . Allergic  rhinitis, cause unspecified   . Unspecified sinusitis (chronic)   . Leukocytopenia, unspecified   . Pneumonia, organism unspecified   . Other specified disorder of skin   . Family history of malignant neoplasm of breast   . Diverticulosis of colon (without mention of hemorrhage)   . Heart murmur     dx as child - s/p rheumatic fever  . Lung nodule 06/2009    CT of chest, 4 mm subpleural nodule in the superior segment of the right lower lobe    Past Surgical History  Procedure Date  . Foot surgery   . Tubal ligation   . Tonsillectomy   . Breast surgery     biopsy - benign  . Dilation and curettage of uterus      Allergies:  Allergies  Allergen Reactions  . Ciprofloxacin   . Naproxen     Home Medications: Prior to Admission medications   Medication Sig Start Date End Date Taking? Authorizing Provider  aspirin 325 MG tablet Take 325 mg by mouth daily.    Historical Provider, MD  estradiol (ESTRACE) 0.1 MG/GM vaginal cream as directed.      Historical Provider, MD  losartan-hydrochlorothiazide (HYZAAR) 50-12.5 MG per tablet Take 1 tablet by mouth daily.    Historical Provider, MD  metoprolol succinate (TOPROL XL) 25 MG 24 hr tablet Take 1 tablet (25 mg total) by mouth daily as needed. 12/30/11 12/29/12  Lewayne Bunting, MD  mometasone (NASONEX) 50 MCG/ACT nasal spray Place 2 sprays into the nose as needed.     Historical Provider, MD  Multiple Vitamins-Minerals (CENTRUM PO) Take 1 tablet by mouth daily.  Historical Provider, MD  Omega-3 Fatty Acids (FISH OIL) 1200 MG CAPS Take 2 capsules by mouth daily.      Historical Provider, MD   Inpatient Medications:     . potassium chloride SA  40 mEq Oral Once    (Not in a hospital admission)  Family History  Problem Relation Age of Onset  . Cancer Mother     lung, smoker  . Heart disease Mother 78    CABG  . COPD Father     smoker  . Hyperlipidemia Other   . Hypertension Other      History   Social History  .  Marital Status: Married    Spouse Name: Erin Alvarez    Number of Children: 2  . Years of Education: N/A   Occupational History  . Retired Interior and spatial designer    Social History Main Topics  . Smoking status: Former Smoker -- 2.0 packs/day for 15 years    Quit date: 06/22/1974  . Smokeless tobacco: Not on file  . Alcohol Use: Yes  . Drug Use:   . Sexually Active:    Other Topics Concern  . Not on file   Social History Narrative   Married2 childrenQuit tobacco 1976 - smoked 2 ppd x 15Retired - hair dresserAlcohol use: yesSon coming in overseas - living in [place]     Review of Systems: General: positive for transient jaw discomfort, negative for chills, fever, night sweats or weight changes.  Cardiovascular: negative for chest pain, dyspnea on exertion, edema, orthopnea, palpitations, paroxysmal nocturnal dyspnea or shortness of breath Dermatological: negative for rash Respiratory: negative for cough or wheezing Urologic: negative for hematuria Abdominal: negative for nausea, vomiting, diarrhea, bright red blood per rectum, melena, or hematemesis Neurologic: positive for lightheadedness, negative for visual changes, syncope, or dizziness All other systems reviewed and are otherwise negative except as noted above.  Physical Exam: Blood pressure 102/69, pulse 75, temperature 98.2 F (36.8 C), temperature source Oral, resp. rate 18, SpO2 98.00%.    General: Well developed, well nourished, in no acute distress. Head: Normocephalic, atraumatic, sclera non-icteric, no xanthomas, nares are without discharge.  Neck: Negative for carotid bruits. JVD not elevated. Lungs: Clear bilaterally to auscultation without wheezes, rales, or rhonchi. Breathing is unlabored. Heart:  Irregularly irregular, with S1 S2. No murmurs, rubs, or gallops appreciated. Abdomen: Soft, non-tender, non-distended with normoactive bowel sounds. No hepatomegaly. No rebound/guarding. No obvious abdominal masses. Msk:  Strength  and tone appears normal for age. Extremities: No clubbing, cyanosis or edema.  Distal pedal pulses are 2+ and equal bilaterally. Neuro: Alert and oriented X 3. Moves all extremities spontaneously. Psych:  Responds to questions appropriately with a normal affect.  Labs: Recent Labs  Basename 05/04/12 1452   WBC 5.7   HGB 14.9   HCT 43.4   MCV 86.1   PLT 208    Lab 05/04/12 1452  NA 139  K 3.1*  CL 99  CO2 31  BUN 14  CREATININE 0.71  CALCIUM 9.7  PROT --  BILITOT --  ALKPHOS --  ALT --  AST --  AMYLASE --  LIPASE --  GLUCOSE 127*   Radiology/Studies: Dg Chest 2 View  05/04/2012  *RADIOLOGY REPORT*  Clinical Data: Atrial fibrillation  CHEST - 2 VIEW  Comparison: 06/07/2009  Findings: The heart and pulmonary vascularity are within normal limits.  The lungs are well aerated without focal infiltrate or sizable effusion.  No acute bony abnormality is noted.  IMPRESSION: No acute abnormality noted.  Original Report Authenticated By: Alcide Clever, M.D.    EKG:  1440: Atrial fibrillation w/ RVR, 150 bpm,  LAD, IVCD, diffuse upsloping STs 1611: NSR, 71 bpm, LAD, IVCD, no ST/T changes  ASSESSMENT AND PLAN:   1. Paroxysmal atrial fibrillation w/ RVR- patient w/ history of intermittent tachy-palpitations, previously undiagnosed, now presents with several hours of tachy-palpitations with underlying a-fib with RVR at play. She received a diltiazem IV bolus in the ED and spontaneously converted to NSR. Note, she is hypokalemic in the ED. CHADS2 score = 1 in HTN. Will continue full-dose ASA. Jaw discomfort has occurred twice in the setting of tachy-palpitations. Favor coronary underperfusion secondary supply-demand mismatch while in a-fib w/ RVR over underlying CAD/ACS as an etiology. Will change BB frequency to scheduled to begin tomorrow (labile BPs post-diltiazem infusion).   2. Hypokalemia- likely from diuretic. Replete.   3. HTN- on combo ARB/HCTZ. Advise to continue to monitor BPs  on switching to scheduled BB. Discontinue combo if BP labile on both agents.    Signed, R. Hurman Horn, PA-C 05/04/2012, 4:50 PM   Patient seen and examined and history reviewed. Agree with above findings and plan. 66 yo WF with long history of intermittent palpitations. Extensive cardiac evaluation as noted above including stress tests, echo, and monitors. Presents with typical palpitations today. Found to be in Afib with rate 156 bpm. Converted to NSR after IV diltiazem. Now feels normal. Symptoms have been occuring about every 3-4 months and typically last several hours. Exam is normal. Labs all reviewed personally. OK for discharge home from ED. Will start Toprol XL daily 25 mg. Repleat potassium with daily supplement. Continue ASA. Follow up with Dr. Jens Alvarez in the office.  Thedora Hinders, [facility] 05/04/2012 5:08 PM

## 2012-05-04 NOTE — ED Notes (Signed)
Pt went to bathroom, when returned noticed HR 78 SR. EKG obtained given to EDP and Cardiology. Denying any cp or sob. Cardizem drip held at this time

## 2012-05-04 NOTE — ED Notes (Signed)
Pt c/o palpitations and sent here from PCP for new onset afib; pt sts hx same but not ever seen on EKG prior to resolution; pt denies CP or SOB

## 2012-05-04 NOTE — ED Provider Notes (Signed)
History     CSN: [id number]  Arrival date & time 05/04/12  1429   First MD Initiated Contact with Patient 05/04/12 1507      Chief Complaint  Patient presents with  . Atrial Fibrillation    (Consider location/radiation/quality/duration/timing/severity/associated sxs/prior treatment) The history is provided by the patient.  Erin Alvarez is a 66 y.o. female history of hypertension, palpitations here with new-onset atrial fibrillation. She had intermittent palpitations since April of this year. Had a Holter monitor for a month that didn't show a cause. Today she had another episode of palpitations and was at the PMD office and had atrial fibrillation on EKG. Denies any chest pain or shortness of breath or weakness or numbness. Sent in for cardiology evaluation.   Past Medical History  Diagnosis Date  . Essential hypertension, benign   . Disorder of bone and cartilage, unspecified   . Allergic rhinitis, cause unspecified   . Unspecified sinusitis (chronic)   . Leukocytopenia, unspecified   . Pneumonia, organism unspecified   . Other specified disorder of skin   . Family history of malignant neoplasm of breast   . Diverticulosis of colon (without mention of hemorrhage)   . Heart murmur     dx as child - s/p rheumatic fever  . Lung nodule 06/2009    CT of chest, 4 mm subpleural nodule in the superior segment of the right lower lobe    Past Surgical History  Procedure Date  . Foot surgery   . Tubal ligation   . Tonsillectomy   . Breast surgery     biopsy - benign  . Dilation and curettage of uterus     Family History  Problem Relation Age of Onset  . Cancer Mother     lung, smoker  . Heart disease Mother 42    CABG  . COPD Father     smoker  . Hyperlipidemia Other   . Hypertension Other     History  Substance Use Topics  . Smoking status: Former Smoker -- 2.0 packs/day for 15 years    Quit date: 06/22/1974  . Smokeless tobacco: Not on file  . Alcohol Use: Yes      OB History    Grav Para Term Preterm Abortions TAB SAB Ect Mult Living                  Review of Systems  Cardiovascular: Positive for palpitations.  All other systems reviewed and are negative.    Allergies  Ciprofloxacin and Naproxen  Home Medications   Current Outpatient Rx  Name  Route  Sig  Dispense  Refill  . ASPIRIN 325 MG PO TABS   Oral   Take 325 mg by mouth daily.         Marland Kitchen ESTRADIOL 0.1 MG/GM VA CREA      as directed.           Marland Kitchen LOSARTAN POTASSIUM-HCTZ 50-12.5 MG PO TABS   Oral   Take 1 tablet by mouth daily.         Marland Kitchen METOPROLOL SUCCINATE ER 25 MG PO TB24   Oral   Take 1 tablet (25 mg total) by mouth daily as needed.   30 tablet   11   . MOMETASONE FUROATE 50 MCG/ACT NA SUSP   Nasal   Place 2 sprays into the nose as needed.          . CENTRUM PO   Oral   Take 1 tablet  by mouth daily.           Marland Kitchen FISH OIL 1200 MG PO CAPS   Oral   Take 2 capsules by mouth daily.             BP 102/69  Pulse 75  Temp 98.2 F (36.8 C) (Oral)  Resp 18  SpO2 98%  Physical Exam  Nursing note and vitals reviewed. Constitutional: She is oriented to person, place, and time. She appears well-developed and well-nourished.       NAD  HENT:  Head: Normocephalic.  Mouth/Throat: Oropharynx is clear and moist.  Eyes: Conjunctivae normal are normal. Pupils are equal, round, and reactive to light.  Neck: Normal range of motion. Neck supple.  Cardiovascular:       Irregular heart rate, tachycardic   Pulmonary/Chest: Effort normal and breath sounds normal. No respiratory distress. She has no wheezes.  Abdominal: Soft. Bowel sounds are normal. She exhibits no distension. There is no tenderness. There is no rebound.  Musculoskeletal: Normal range of motion. She exhibits no edema and no tenderness.  Neurological: She is alert and oriented to person, place, and time. No cranial nerve deficit. Coordination normal.  Skin: Skin is warm and dry.   Psychiatric: She has a normal mood and affect. Her behavior is normal. Judgment and thought content normal.    ED Course  Procedures (including critical care time)  Labs Reviewed  BASIC METABOLIC PANEL - Abnormal; Notable for the following:    Potassium 3.1 (*)     Glucose, Bld 127 (*)     GFR calc non Af Amer 87 (*)     All other components within normal limits  CBC WITH DIFFERENTIAL  PROTIME-INR  POCT I-STAT TROPONIN I   Dg Chest 2 View  05/04/2012  *RADIOLOGY REPORT*  Clinical Data: Atrial fibrillation  CHEST - 2 VIEW  Comparison: 06/07/2009  Findings: The heart and pulmonary vascularity are within normal limits.  The lungs are well aerated without focal infiltrate or sizable effusion.  No acute bony abnormality is noted.  IMPRESSION: No acute abnormality noted.   Original Report Authenticated By: Alcide Clever, M.D.      1. New onset a-fib      Date: 05/04/2012- 1   Rate: 156  Rhythm: atrial fibrillation  QRS Axis: normal  Intervals: normal  ST/T Wave abnormalities: normal  Conduction Disutrbances:none  Narrative Interpretation:   Old EKG Reviewed: changes noted    Date: 05/04/2012- 2   Rate: 71  Rhythm: normal sinus rhythm  QRS Axis: normal  Intervals: normal  ST/T Wave abnormalities: normal  Conduction Disutrbances:none  Narrative Interpretation:   Old EKG Reviewed: changes noted     MDM  Erin Alvarez is a 66 y.o. female here with new onset afib. She is in rapid afib so will slow down with meds. Cardiology consulted.   4:15 PM Cardiology at bedside. HR slowed to 100. She spontaneously converted by herself after fluids. Will hold off on cardizem. Cardiology evaluated and felt that she can be discharged on outpatient toprol and outpatient f/u. I didn't recommend anticoagulation at this point.        Richardean Canal, MD 05/04/12 [phone]

## 2012-05-04 NOTE — ED Notes (Addendum)
Pt felt palpitations of her heart this afternoon and jaw pain. She states she has had a similar feeling in the past, but this time is worse. Denies chest pain, weakness, sob, diaphoresis. Pt A&Ox4, ambulatory.

## 2012-05-05 ENCOUNTER — Telehealth: Payer: Self-pay | Admitting: Cardiology

## 2012-05-05 NOTE — Telephone Encounter (Signed)
New problem:    Attaching message early from patient who called to inquired about her appt with [name] on 11/26.   Patient called  this am to discuss why she was seeing the PA [name], following her discharge from the hospital. Explain to patient the reason why she was seeing the pa due to her 2 weeks post hospital.  Pt insisted on seeing Dr. Jens Som inform of  first available he had in the office was Dec 20. Patient understood . Advise her to see PA then her next visit will be with Dr. Jens Som.   Patient calling back to discuss medication that was prescribe for her  Toprol. Xl  25 mg .

## 2012-05-05 NOTE — Telephone Encounter (Signed)
Spoke with pt, she is concerned because she was told to take metoprolol 25 mg once everyday. She had been taking it prn. She reports when in sinus her heart rate is only around 60 bpm and the toprol may drop it too low. Pt given the okay to try 1/2 tablet daily and watch her pulse rate and at her follow up appt report how her heart rate is doing. Pt agreed with this plan.

## 2012-05-17 ENCOUNTER — Encounter: Payer: Medicare Other | Admitting: Physician Assistant

## 2012-06-02 ENCOUNTER — Telehealth: Payer: Self-pay | Admitting: Cardiology

## 2012-06-02 NOTE — Telephone Encounter (Signed)
Called by patient regarding onset of her known intermittent atrial fibrillation. Symptoms of palpitations. No red flag symptoms of syncope, lightheadedness or chest pain.  Reports that typically her afib self terminates but this has persisted x 2 hours. Has had a chemical cardioversion in the past she reports (maybe this was just dilt). Noted er visit 04/2012 for afib, converted with diltiazem.  Currently on metoprolol xl 12.5 qday.  Recommended addtional 1/2 tab (12.5) of metoprolol and call Dr. Norris Cross office in the AM (?Changed physicians). Reviewed urgent indications to come to ER. Will notify [facility] cardiology of call.

## 2013-02-04 IMAGING — CR DG CHEST 2V
2 series · 2 of 2 positions shown · non-contrast
Comparison: 06/07/2009

CLINICAL DATA: Atrial fibrillation

CHEST - 2 VIEW

[w chest pa]
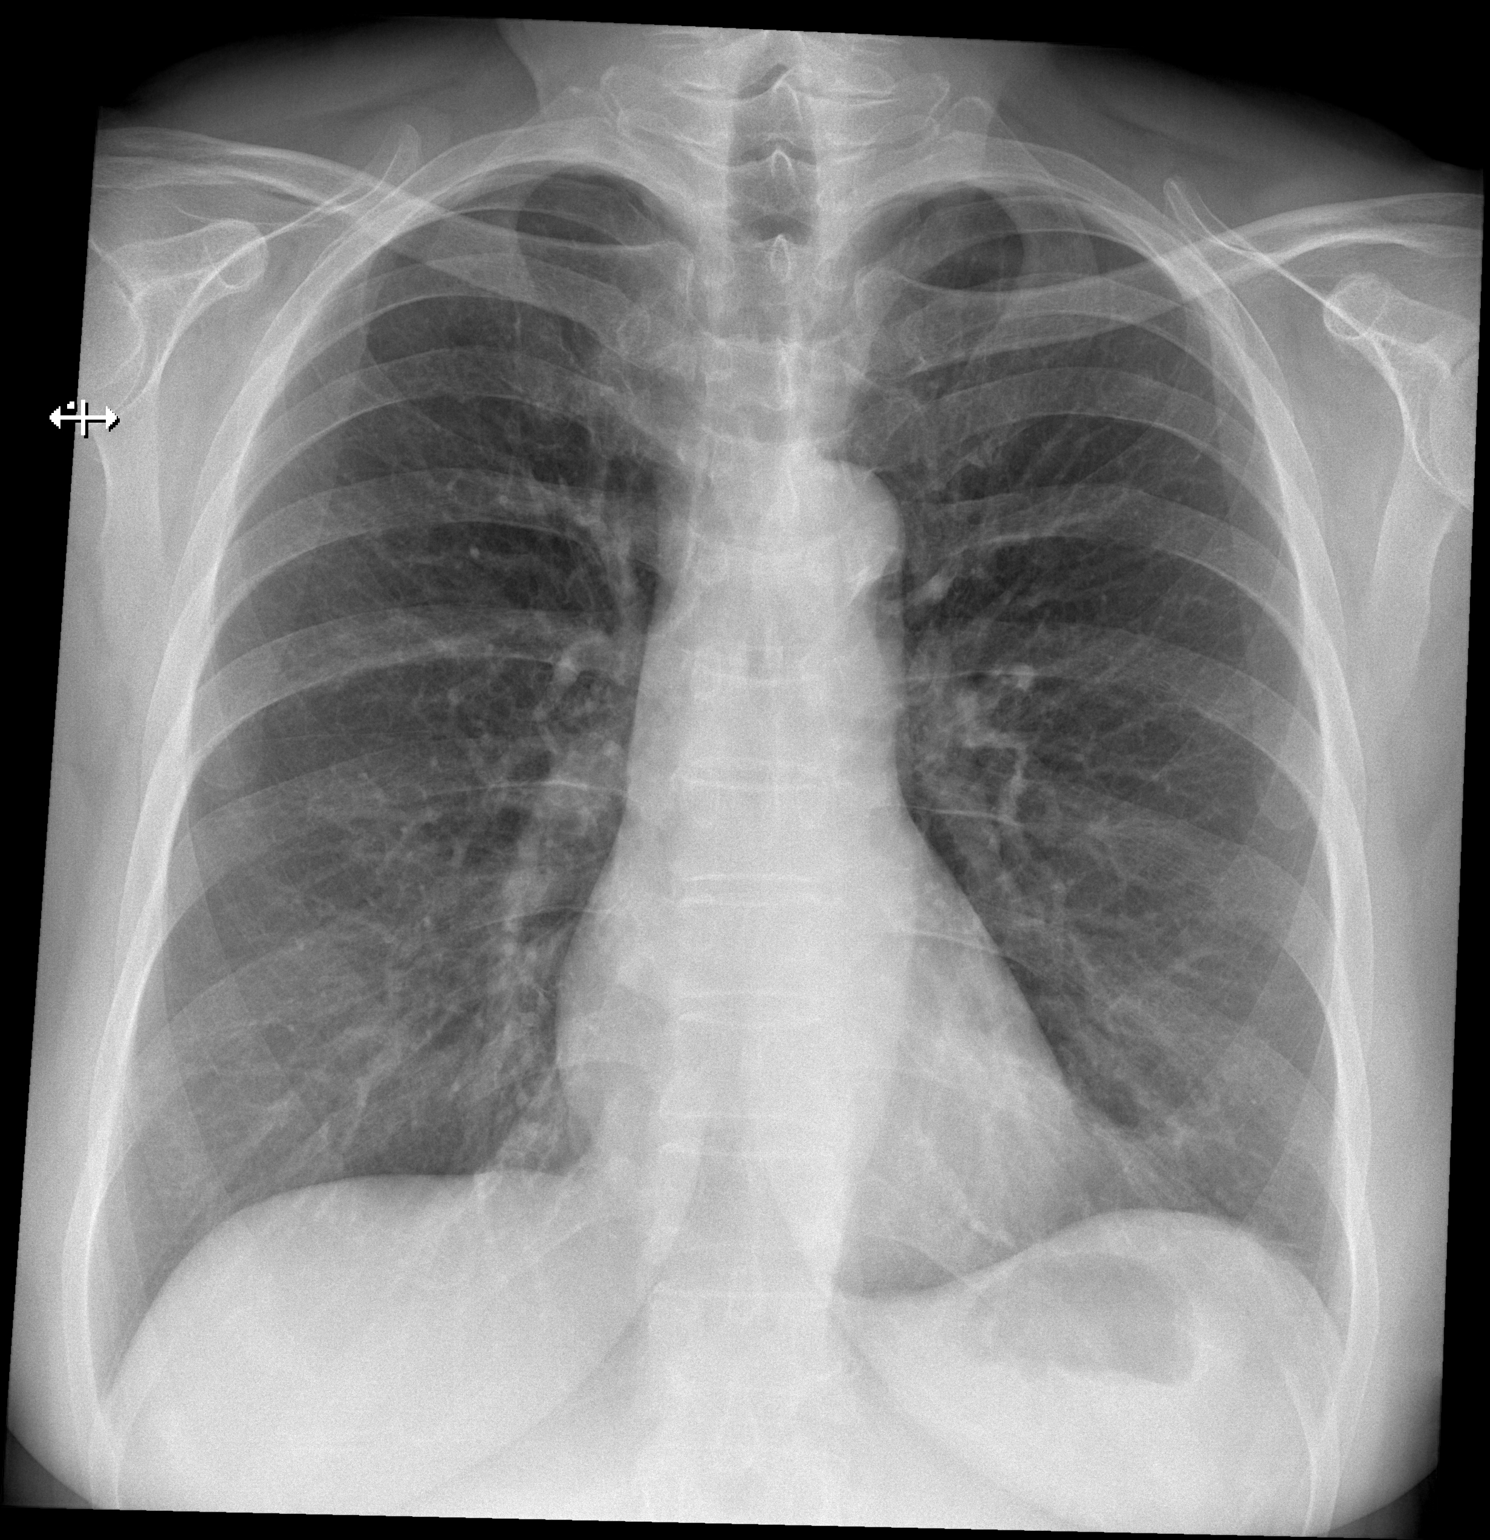

[w chest lat]
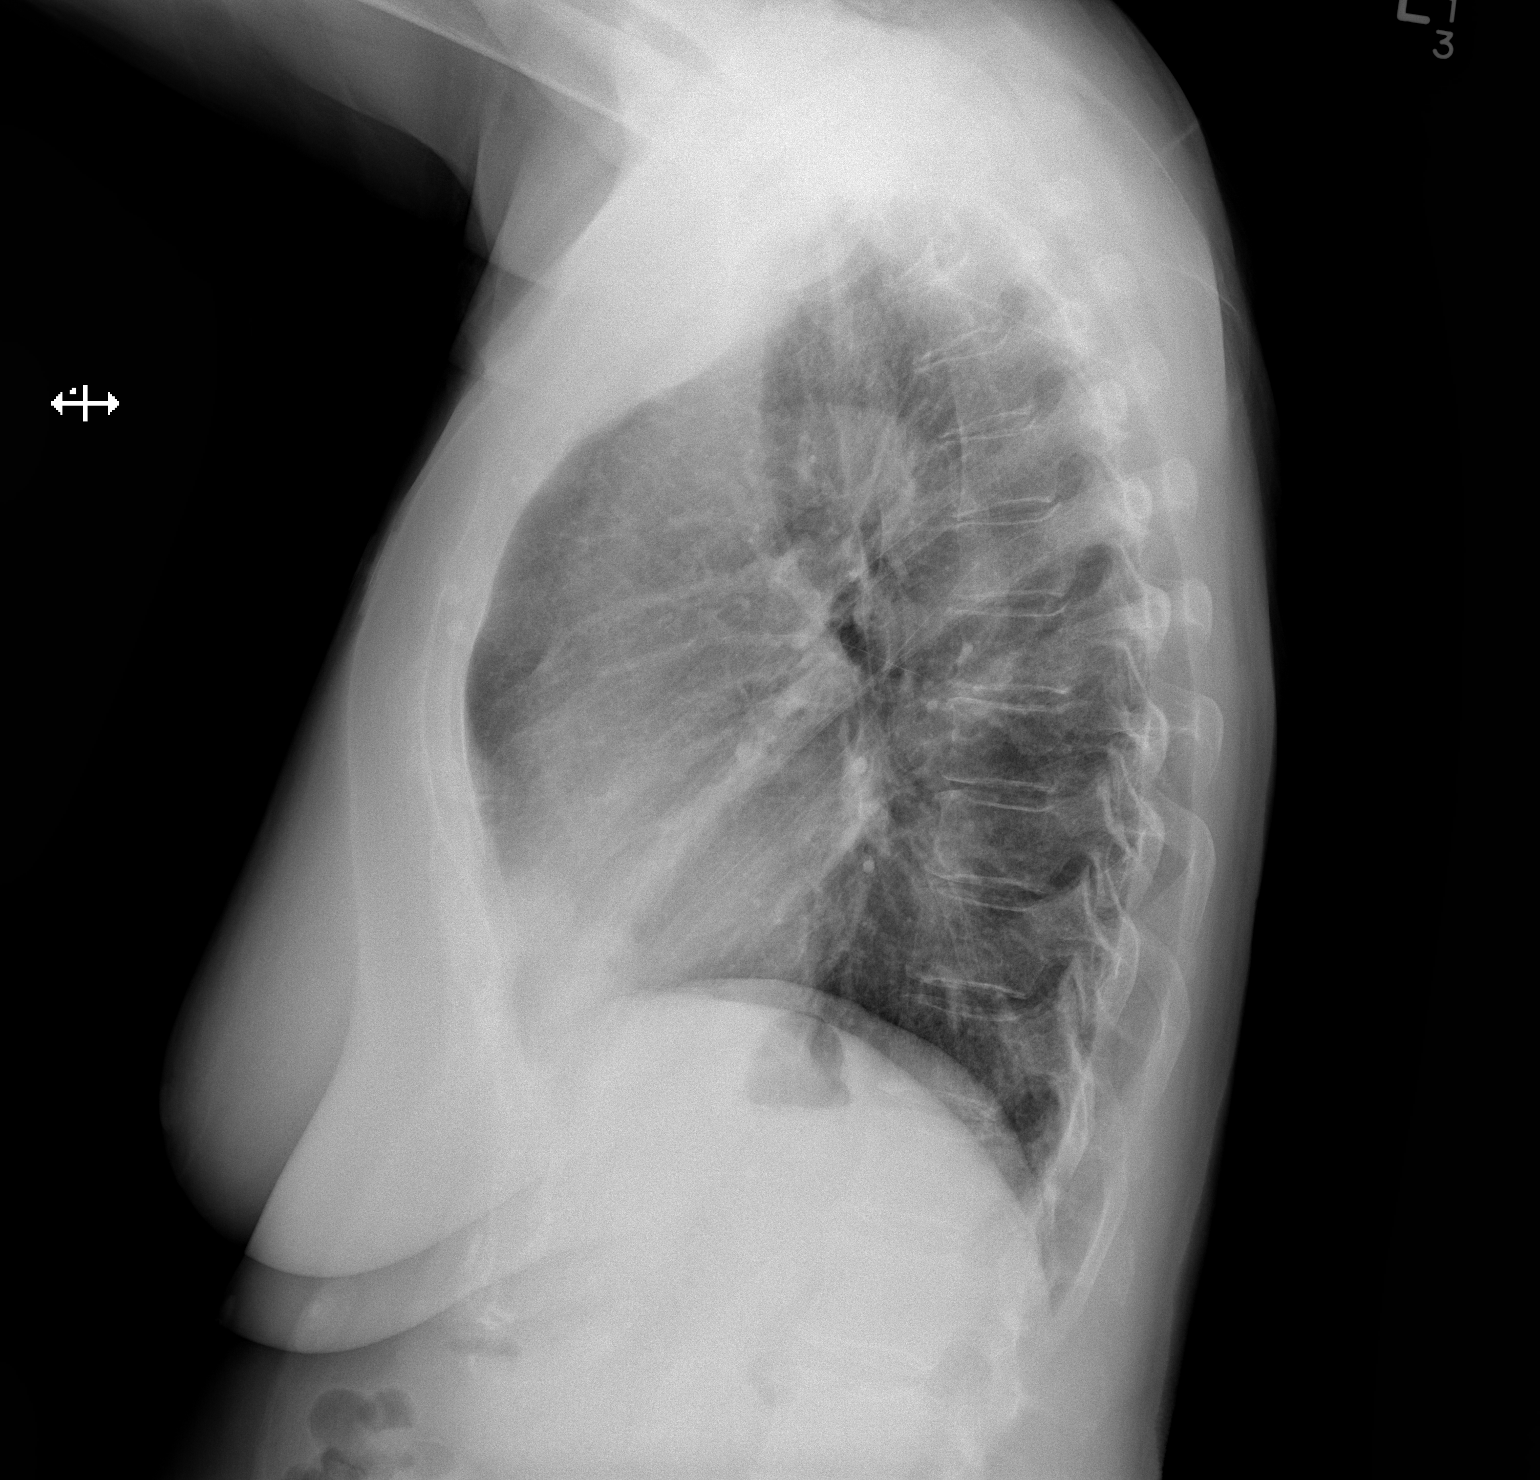

[2 of 2 positions shown; findings below may reference images not displayed]

FINDINGS: The heart and pulmonary vascularity are within normal
limits.  The lungs are well aerated without focal infiltrate or
sizable effusion.  No acute bony abnormality is noted.
IMPRESSION: No acute abnormality noted.

## 2013-03-20 ENCOUNTER — Other Ambulatory Visit (HOSPITAL_COMMUNITY): Payer: Self-pay | Admitting: Cardiology

## 2013-03-20 DIAGNOSIS — I4891 Unspecified atrial fibrillation: Secondary | ICD-10-CM

## 2013-03-21 ENCOUNTER — Ambulatory Visit (HOSPITAL_COMMUNITY): Payer: Medicare Other | Attending: Cardiology

## 2013-03-21 DIAGNOSIS — I079 Rheumatic tricuspid valve disease, unspecified: Secondary | ICD-10-CM | POA: Insufficient documentation

## 2013-03-21 DIAGNOSIS — Z87891 Personal history of nicotine dependence: Secondary | ICD-10-CM | POA: Insufficient documentation

## 2013-03-21 DIAGNOSIS — I059 Rheumatic mitral valve disease, unspecified: Secondary | ICD-10-CM | POA: Insufficient documentation

## 2013-03-21 DIAGNOSIS — I4891 Unspecified atrial fibrillation: Secondary | ICD-10-CM | POA: Insufficient documentation

## 2013-03-21 DIAGNOSIS — R0989 Other specified symptoms and signs involving the circulatory and respiratory systems: Secondary | ICD-10-CM | POA: Insufficient documentation

## 2013-03-21 DIAGNOSIS — I10 Essential (primary) hypertension: Secondary | ICD-10-CM | POA: Insufficient documentation

## 2013-03-21 DIAGNOSIS — R002 Palpitations: Secondary | ICD-10-CM | POA: Insufficient documentation

## 2013-03-21 DIAGNOSIS — R609 Edema, unspecified: Secondary | ICD-10-CM | POA: Insufficient documentation

## 2013-03-21 DIAGNOSIS — G4730 Sleep apnea, unspecified: Secondary | ICD-10-CM | POA: Insufficient documentation

## 2013-03-21 DIAGNOSIS — R0609 Other forms of dyspnea: Secondary | ICD-10-CM | POA: Insufficient documentation

## 2013-03-21 NOTE — Progress Notes (Signed)
Echocardiogram performed.  

## 2013-03-27 ENCOUNTER — Telehealth: Payer: Self-pay | Admitting: Cardiology

## 2013-03-27 NOTE — Telephone Encounter (Signed)
New message  Had echo 03-21-13 and have not gotten the results.

## 2013-03-29 ENCOUNTER — Telehealth: Payer: Self-pay | Admitting: General Surgery

## 2013-03-29 MED ORDER — FLECAINIDE ACETATE 50 MG PO TABS: 50.0000 mg | ORAL_TABLET | Freq: Two times a day (BID) | ORAL | Status: DC

## 2013-03-29 NOTE — Telephone Encounter (Signed)
Pt is aware ETT needs to be schduled. She also needs to be set up for a 4 week f/u with Dr. Mayford Knife.

## 2013-03-29 NOTE — Telephone Encounter (Signed)
Message copied by Nita Sells on Wed Mar 29, 2013  3:30 PM ------      Message from: Armanda Magic R      Created: Wed Mar 29, 2013  2:20 PM       It is required that e perform an ETT 1-2 weeks after starting Flecainide to make sure patient is not at risk for exercise induced arrhythmias.  If she refuses the study we will not be able to keep her on Flecainide ------

## 2013-03-29 NOTE — Telephone Encounter (Signed)
Pt is aware of ECHO report 

## 2013-03-29 NOTE — Telephone Encounter (Signed)
Message copied by Nita Sells on Wed Mar 29, 2013  2:03 PM ------      Message from: Armanda Magic R      Created: Fri Mar 24, 2013  1:26 PM       Please let patient know that heart function is normal with increased thickening of heart muscle, mildly leaky MV.  Her nuclear stress test was normal.  Please have her start Flecainide 50mg  BID for suppression of PAF.  Please set up an ETT in 1 week to look for exercise induced proarrhythmia and have her follow up with me in 4 weeks. ------

## 2013-03-30 NOTE — Addendum Note (Signed)
Addended by: Nita Sells on: 03/30/2013 08:07 AM   Modules accepted: Orders

## 2013-04-05 NOTE — Telephone Encounter (Signed)
GXT with Dr. Mayford Knife 04/13/13 @ 4pm

## 2013-04-07 ENCOUNTER — Encounter: Payer: Medicare Other | Admitting: Cardiology

## 2013-04-10 ENCOUNTER — Telehealth: Payer: Self-pay | Admitting: Cardiology

## 2013-04-10 NOTE — Telephone Encounter (Signed)
To Dr. Mayford Knife to advise. Pt is getting the ETT bc she is on flecinide.

## 2013-04-10 NOTE — Telephone Encounter (Signed)
The patient already had a nuclear to assess for ischemia and it was fine.  She only needs an ETT to assess for any other exercise induced arrhythmias on Flecainide.  A nuclear stress test does not need to be done and insurance will not pay for it

## 2013-04-10 NOTE — Telephone Encounter (Signed)
Spoke with pt and made aware

## 2013-04-10 NOTE — Telephone Encounter (Signed)
New Problem:  Pt states she wants to know what/if any medications she is to stop before her stress test. Pt also states she wants to have a nuclear study instead of an exercise treadmill... Please advise

## 2013-04-13 ENCOUNTER — Encounter: Payer: Medicare Other | Admitting: Cardiology

## 2013-04-13 ENCOUNTER — Ambulatory Visit (INDEPENDENT_AMBULATORY_CARE_PROVIDER_SITE_OTHER): Payer: Medicare Other | Admitting: Cardiology

## 2013-04-13 DIAGNOSIS — I4891 Unspecified atrial fibrillation: Secondary | ICD-10-CM

## 2013-04-13 DIAGNOSIS — Z79899 Other long term (current) drug therapy: Secondary | ICD-10-CM

## 2013-04-13 DIAGNOSIS — I480 Paroxysmal atrial fibrillation: Secondary | ICD-10-CM

## 2013-04-13 NOTE — Progress Notes (Signed)
Exercise Treadmill Test  Pre-Exercise Testing Evaluation Rate: 47   Rhythm : Sinus Bradycardia    Test  Exercise Tolerance Test Ordering MD: Armanda Magic, MD  Interpreting MD: Armanda Magic, MD  Unique Test No: 1  Treadmill:  1  Indication for ETT: A-Fib, Flecainide-medication management  Contraindication to ETT: No   Stress Modality: exercise - treadmill  Cardiac Imaging Performed: non   Protocol: standard Bruce - maximal  Max BP:  173/78mmHg  Max MPHR (bpm):  151 85% MPR (bpm):  128  MPHR obtained (bpm):  98bpm % MPHR obtained:  65%  Reached 85% MPHR (min:sec): did not reach Total Exercise Time (min-sec):  6 minutes  Workload in METS:  7 Borg Scale: 19  Reason ETT Terminated:  patient's desire to stop    ST Segment Analysis At Rest: normal ST segments - no evidence of significant ST depression With Exercise: no evidence of significant ST depression  Other Information Arrhythmia:  Yes PVC's Angina during ETT:  No Quality of ETT:  non-diagnostic due to max predicted HR not achieved  ETT Interpretation:  normal - no evidence of ischemia by ST analysis.  No exercise induced arrhythmias on Flecainide.   Comments: Submaximal ETT due to beta blocker therapy. No ST changes of ischemia No exercise induced arrhythmias on Flecainide   Recommendations: Continue current medical therapy.  Signed, Armanda Magic, MD

## 2013-04-14 ENCOUNTER — Telehealth: Payer: Self-pay | Admitting: *Deleted

## 2013-04-14 ENCOUNTER — Other Ambulatory Visit: Payer: Self-pay | Admitting: *Deleted

## 2013-04-14 MED ORDER — FLECAINIDE ACETATE 50 MG PO TABS: 50.0000 mg | ORAL_TABLET | Freq: Two times a day (BID) | ORAL | Status: DC

## 2013-04-14 NOTE — Telephone Encounter (Signed)
Patient called requesting a 90 day supply of her Flecanide due to insurance requiring this. Rx filled.

## 2013-06-13 ENCOUNTER — Telehealth: Payer: Self-pay

## 2013-06-13 MED ORDER — METOPROLOL SUCCINATE ER 25 MG PO TB24: 25.0000 mg | ORAL_TABLET | Freq: Every day | ORAL | Status: DC

## 2013-06-13 MED ORDER — FLECAINIDE ACETATE 50 MG PO TABS: 50.0000 mg | ORAL_TABLET | Freq: Two times a day (BID) | ORAL | Status: DC

## 2013-06-13 MED ORDER — RIVAROXABAN 20 MG PO TABS: 20.0000 mg | ORAL_TABLET | Freq: Every day | ORAL | Status: DC

## 2013-06-13 MED ORDER — LOSARTAN POTASSIUM-HCTZ 50-12.5 MG PO TABS: 1.0000 | ORAL_TABLET | Freq: Every day | ORAL | Status: DC

## 2013-06-13 NOTE — Addendum Note (Signed)
Addended by: Nita Sells on: 06/13/2013 09:28 AM   Modules accepted: Orders

## 2013-06-13 NOTE — Telephone Encounter (Signed)
RX sent in for pt  

## 2013-07-10 ENCOUNTER — Other Ambulatory Visit: Payer: Self-pay | Admitting: Cardiology

## 2013-08-31 ENCOUNTER — Encounter: Payer: Self-pay | Admitting: General Surgery

## 2013-09-04 ENCOUNTER — Other Ambulatory Visit: Payer: Medicare Other

## 2013-09-05 ENCOUNTER — Other Ambulatory Visit: Payer: Medicare Other

## 2013-09-05 ENCOUNTER — Ambulatory Visit: Payer: Medicare Other | Admitting: Cardiology

## 2013-09-29 ENCOUNTER — Other Ambulatory Visit: Payer: Medicare Other

## 2013-10-03 ENCOUNTER — Encounter: Payer: Self-pay | Admitting: Cardiology

## 2013-10-03 ENCOUNTER — Ambulatory Visit (INDEPENDENT_AMBULATORY_CARE_PROVIDER_SITE_OTHER): Payer: Medicare HMO | Admitting: Cardiology

## 2013-10-03 ENCOUNTER — Other Ambulatory Visit: Payer: Medicare Other

## 2013-10-03 VITALS — BP 130/78 | HR 49 | Ht 65.0 in | Wt 174.0 lb

## 2013-10-03 DIAGNOSIS — R001 Bradycardia, unspecified: Secondary | ICD-10-CM

## 2013-10-03 DIAGNOSIS — G4733 Obstructive sleep apnea (adult) (pediatric): Secondary | ICD-10-CM

## 2013-10-03 DIAGNOSIS — I498 Other specified cardiac arrhythmias: Secondary | ICD-10-CM

## 2013-10-03 DIAGNOSIS — I480 Paroxysmal atrial fibrillation: Secondary | ICD-10-CM

## 2013-10-03 DIAGNOSIS — I10 Essential (primary) hypertension: Secondary | ICD-10-CM

## 2013-10-03 DIAGNOSIS — I4891 Unspecified atrial fibrillation: Secondary | ICD-10-CM

## 2013-10-03 DIAGNOSIS — R079 Chest pain, unspecified: Secondary | ICD-10-CM | POA: Insufficient documentation

## 2013-10-03 LAB — BASIC METABOLIC PANEL
BUN: 15 mg/dL (ref 6–23)
CO2: 30 meq/L (ref 19–32)
CREATININE: 0.8 mg/dL (ref 0.4–1.2)
Calcium: 9.6 mg/dL (ref 8.4–10.5)
Chloride: 103 mEq/L (ref 96–112)
GFR: 74.34 mL/min (ref >60.00)
Glucose, Bld: 96 mg/dL (ref 70–99)
Potassium: 4 mEq/L (ref 3.5–5.1)
Sodium: 140 mEq/L (ref 135–145)

## 2013-10-03 LAB — CBC
HCT: 41.3 % (ref 36.0–46.0)
Hemoglobin: 13.7 g/dL (ref 12.0–15.0)
MCHC: 33.1 g/dL (ref 30.0–36.0)
MCV: 89.9 fl (ref 78.0–100.0)
Platelets: 198 10*3/uL (ref 150.0–400.0)
RBC: 4.6 Mil/uL (ref 3.87–5.11)
RDW: 13.4 % (ref 11.5–14.6)
WBC: 5.7 10*3/uL (ref 4.5–10.5)

## 2013-10-03 NOTE — Patient Instructions (Addendum)
Your physician has recommended you make the following change in your medication: 1. Decrease Toprol to 1/2 tablet daily for one week and then stop Toprol.  Your physician recommends that you go to the lab today for a BMET and CBC  Your physician has requested that you have an exercise stress myoview. For further information please visit [email or web address]. Please follow instruction sheet, as given.   Dr [name] has recommended that the pt cut out all added salt  Your physician wants you to follow-up in: 6 months with Dr Mallie Snooks will receive a reminder letter in the mail two months in advance. If you don't receive a letter, please call our office to schedule the follow-up appointment.

## 2013-10-03 NOTE — Progress Notes (Signed)
[place], [place], [place]  [address] Phone: [phone] Fax:  [phone]  Date:  10/03/2013   ID:  WHITTLEY CARANDANG, DOB 12-Jul-1945, MRN [id number]  PCP:  Vidal Schwalbe, MD  Cardiologist:  [name], MD     History of Present Illness: MENA SIMONIS is a 68 y.o. female with a history of atypical CP with a stress echo in the past with no ischemia.  She has a history of rheumatic fever as a child.  She has grade 2 diastolic dysfunction by echo.  She was diagnosed with PAF in 2013 and has been on Xarelto for a CHADS2Vasc score of 3.  She has severe OSA with an initial AHI of 29/hr on her sleep study and is on CPAP.  She is doing well today.  She denies any SOB, DOE,  dizziness, palpitations or syncope.  She has had a few episodes of achiness in her chest that she is very vague in description.  She tolerates her CPAP well.  She tolerates the mask and feels the pressure is adequate.  She feels refreshed when she gets up in the am and has no daytime sleepiness. She has complained of some increased LE edema recently and she does use table salt at home.     Wt Readings from Last 3 Encounters:  10/03/13 174 lb (78.926 kg)  12/30/11 170 lb (77.111 kg)  11/04/11 169 lb (76.658 kg)     Past Medical History  Diagnosis Date  . Disorder of bone and cartilage, unspecified   . Allergic rhinitis, cause unspecified   . Unspecified sinusitis (chronic)   . Leukocytopenia, unspecified   . Pneumonia, organism unspecified   . Other specified disorder of skin   . Family history of malignant neoplasm of breast   . Diverticulosis of colon (without mention of hemorrhage)   . Lung nodule 06/2009    CT of chest, 4 mm subpleural nodule in the superior segment of the right lower lobe  . Paroxysmal atrial fibrillation 05/04/2012    on systemic anticoagulation w Xarelto for CHADS VASC score of 3  . Elbow fracture, right 12/12  . Heart murmur     dx as child - s/p rheumatic fever  . Osteopenia    . OSA (obstructive sleep apnea)     Severe w AHI 29/hr and successful CPAP now on auto CPAP  . Essential hypertension, benign   . H/O: rheumatic fever     Current Outpatient Prescriptions  Medication Sig Dispense Refill  . cetirizine (ZYRTEC) 10 MG tablet Take 10 mg by mouth daily as needed. For allergies      . diltiazem (CARDIZEM CD) 180 MG 24 hr capsule Take 180 mg by mouth daily.      Marland Kitchen estradiol (ESTRACE) 0.1 MG/GM vaginal cream Place 1 Applicatorful vaginally once a week.       . flecainide (TAMBOCOR) 50 MG tablet Take 1 tablet (50 mg total) by mouth 2 (two) times daily.  180 tablet  3  . furosemide (LASIX) 20 MG tablet Take 20 mg by mouth as needed for edema.      Marland Kitchen losartan-hydrochlorothiazide (HYZAAR) 50-12.5 MG per tablet Take 1 tablet by mouth daily.  90 tablet  3  . Magnesium 400 MG CAPS Take 400 mg by mouth daily.      . metoprolol succinate (TOPROL XL) 25 MG 24 hr tablet Take 1 tablet (25 mg total) by mouth daily.  90 tablet  3  .  mometasone (NASONEX) 50 MCG/ACT nasal spray Place 2 sprays into the nose as needed.       . Multiple Vitamins-Minerals (CENTRUM PO) Take 1 tablet by mouth daily.        . NON FORMULARY CPAP      . Omega-3 Fatty Acids (FISH OIL) 1200 MG CAPS Take 2 capsules by mouth daily.        . Rivaroxaban (XARELTO) 20 MG TABS tablet Take 1 tablet (20 mg total) by mouth daily with supper.  90 tablet  3   No current facility-administered medications for this visit.    Allergies:    Allergies  Allergen Reactions  . Ciprofloxacin   . Naproxen     Social History:  The patient  reports that she quit smoking about 39 years ago. She does not have any smokeless tobacco history on file. She reports that she drinks alcohol. She reports that she does not use illicit drugs.   Family History:  The patient's family history includes COPD in her father; Cancer in her mother; Heart disease (age of onset: 46) in her mother; Hyperlipidemia in her brother and other;  Hypertension in her brother, father, and other.   ROS:  Please see the history of present illness.      All other systems reviewed and negative.   PHYSICAL EXAM: VS:  BP 130/78  Pulse 49  Ht 5\' 5"  (1.651 m)  Wt 174 lb (78.926 kg)  BMI 28.96 kg/m2 Well nourished, well developed, in no acute distress HEENT: normal Neck: no JVD Cardiac:  normal S1, S2; RRR; no murmur Lungs:  clear to auscultation bilaterally, no wheezing, rhonchi or rales Abd: soft, nontender, no hepatomegaly Ext: no edema Skin: warm and dry Neuro:  CNs 2-12 intact, no focal abnormalities noted  EKG:  Sinus bradycardia with first degree AV block and nonspecific IVCD     ASSESSMENT AND PLAN:  1. PAF maintaining sinus bradycardia - continue Diltiazem/Flecainide and Xarelto - decrease metoprolol to 25mg  1/2 tablet daily for a week and then stop due to bradycardia - Check NOAC panel 2. Symptomatic bradycardia - she is fatigued and says she cannot to the things she wants to do 3. HTN well controlled - continue diltiazem/Hyzaar 4. OSA on CPAP and tolerating well.  Her download done a week ago at the DME showed an AHI of 3.5/hr and 87% compliance in using more than 4 hours nightly. 5. LE edema most likely exacerbated by added table salt - I have recommended that she cut out all added salt 6. Vague chest discomfort - I will get a Lexiscan myoview to rule out ischemia since she is on Flecainide  Followup with me in 6 months  Signed, [name], MD 10/03/2013 8:55 AM

## 2013-10-17 ENCOUNTER — Encounter (HOSPITAL_COMMUNITY): Payer: Medicare HMO

## 2013-10-18 ENCOUNTER — Encounter: Payer: Self-pay | Admitting: Cardiology

## 2013-10-23 ENCOUNTER — Ambulatory Visit (HOSPITAL_COMMUNITY): Payer: Medicare HMO | Attending: Cardiology | Admitting: Radiology

## 2013-10-23 VITALS — BP 133/72 | HR 57 | Ht 65.0 in | Wt 166.0 lb

## 2013-10-23 DIAGNOSIS — R079 Chest pain, unspecified: Secondary | ICD-10-CM | POA: Insufficient documentation

## 2013-10-23 MED ORDER — TECHNETIUM TC 99M SESTAMIBI GENERIC - CARDIOLITE
11.0000 | Freq: Once | INTRAVENOUS | Status: AC | PRN
  Administered 2013-10-23: 11 via INTRAVENOUS

## 2013-10-23 NOTE — Progress Notes (Signed)
[facility] [address] [place], [place] [address] [phone]    Cardiology Nuclear Med Study  Erin Alvarez is a 68 y.o. female     MRN : [id number]     DOB: June 10, 1946  Procedure Date: 10/23/2013  Nuclear Med Background Indication for Stress Test:  Evaluation for Ischemia History:  No known CAD, hx. Afib, Echo 2014 EF 55-60%, MPI 2014 EF 75% Cardiac Risk Factors: Family History - CAD, History of Smoking and Hypertension  Symptoms:  Chest Pain   Nuclear Pre-Procedure Caffeine/Decaff Intake:  None NPO After: 7:00am   Lungs:  clear O2 Sat: 98% on room air. IV 0.9% NS with Angio Cath:  22g  IV Site: R Hand  IV Started by:  Matilde Haymaker, RN  Chest Size (in):  38 Cup Size: C  Height: 5\' 5"  (1.651 m)  Weight:  166 lb (75.297 kg)  BMI:  Body mass index is 27.62 kg/(m^2). Tech Comments:  n/a    Nuclear Med Study 1 or 2 day study: 1 day  Stress Test Type:  Stress  Reading MD: n/a  Order Authorizing Provider:  Tressia Miners Turner,MD  Resting Radionuclide: Technetium 3m Sestamibi  Resting Radionuclide Dose: 10.9 mCi   Stress Radionuclide:  Technetium 24m Sestamibi  Stress Radionuclide Dose: 33.0 mCi           Stress Protocol Rest HR: 57 Stress HR: 127  Rest BP: 133/72 Stress BP: 136/113  Exercise Time (min): 6:30 METS: 7.0           Dose of Adenosine (mg):  n/a Dose of Lexiscan: n/a mg  Dose of Atropine (mg): n/a Dose of Dobutamine: n/a mcg/kg/min (at max HR)  Stress Test Technologist: Glade Lloyd, BS-ES  Nuclear Technologist:  Charlton Amor, CNMT     Rest Procedure:  Myocardial perfusion imaging was performed at rest 45 minutes following the intravenous administration of Technetium 70m Sestamibi. Rest ECG: LAFB  Stress Procedure:  The patient exercised on the treadmill utilizing the Bruce Protocol for 6:30 minutes. The patient stopped due to fatigue and denied any chest pain.  Technetium 93m Sestamibi was injected at peak exercise and  myocardial perfusion imaging was performed after a brief delay. Stress ECG: No significant change from baseline ECG  QPS Raw Data Images:  Normal; no motion artifact; normal heart/lung ratio. Stress Images:  Normal homogeneous uptake in all areas of the myocardium. Rest Images:  Normal homogeneous uptake in all areas of the myocardium. Subtraction (SDS):  No evidence of ischemia. Transient Ischemic Dilatation (Normal <1.22):  0.86 Lung/Heart Ratio (Normal <0.45):  0.25  Quantitative Gated Spect Images QGS EDV:  94 ml QGS ESV:  31 ml  Impression Exercise Capacity:  Fair exercise capacity. BP Response:  Normal blood pressure response. Clinical Symptoms:  No chest pain. ECG Impression:  No significant ST segment change suggestive of ischemia. Comparison with Prior Nuclear Study: No images to compare  Overall Impression:  Low risk stress nuclear study. Mild diaphragmatic attenuation. No ischemia or scar.  LV Ejection Fraction: 67%.  LV Wall Motion:  Normal Wall Motion  Darlin Coco  MD

## 2013-11-23 ENCOUNTER — Other Ambulatory Visit: Payer: Self-pay | Admitting: *Deleted

## 2013-11-23 MED ORDER — DILTIAZEM HCL ER COATED BEADS 180 MG PO CP24: 180.0000 mg | ORAL_CAPSULE | Freq: Every day | ORAL | Status: DC

## 2014-02-18 ENCOUNTER — Other Ambulatory Visit: Payer: Self-pay | Admitting: Cardiology

## 2014-03-28 ENCOUNTER — Telehealth: Payer: Self-pay | Admitting: Cardiology

## 2014-03-28 NOTE — Telephone Encounter (Signed)
Pt spoke with the dentist and she said that the dentist does not required for her to stop Xarelto for the procedure because there is  minimal bleeding.

## 2014-03-28 NOTE — Telephone Encounter (Signed)
New message          Pt is having a root canal next week / should she stop her blood thinners

## 2014-03-28 NOTE — Telephone Encounter (Signed)
Pt is taking Xarelto 20 mg once daily she is having a root canal done next Wednesday. Pt would like to know if she needs to be off Xarelto for this procedure. Pt is aware to let her dentist know that she is taking this blood thinner. She is to call and  to let Dr. [name] know what the dentist  recommends.

## 2014-03-28 NOTE — Telephone Encounter (Signed)
Patient spoke to the dentist and is returning your call.

## 2014-03-29 NOTE — Telephone Encounter (Signed)
To Dr. Turner.  

## 2014-04-01 ENCOUNTER — Other Ambulatory Visit: Payer: Self-pay | Admitting: Cardiology

## 2014-04-06 ENCOUNTER — Encounter: Payer: Self-pay | Admitting: Cardiology

## 2014-04-06 ENCOUNTER — Ambulatory Visit (INDEPENDENT_AMBULATORY_CARE_PROVIDER_SITE_OTHER): Payer: Medicare HMO | Admitting: Cardiology

## 2014-04-06 VITALS — BP 120/68 | HR 55 | Ht 65.0 in | Wt 170.0 lb

## 2014-04-06 DIAGNOSIS — G4733 Obstructive sleep apnea (adult) (pediatric): Secondary | ICD-10-CM

## 2014-04-06 DIAGNOSIS — R001 Bradycardia, unspecified: Secondary | ICD-10-CM

## 2014-04-06 DIAGNOSIS — I10 Essential (primary) hypertension: Secondary | ICD-10-CM

## 2014-04-06 DIAGNOSIS — I480 Paroxysmal atrial fibrillation: Secondary | ICD-10-CM

## 2014-04-06 LAB — CBC
HEMATOCRIT: 41.1 % (ref 36.0–46.0)
Hemoglobin: 14.5 g/dL (ref 12.0–15.0)
MCH: 30.1 pg (ref 26.0–34.0)
MCHC: 35.3 g/dL (ref 30.0–36.0)
MCV: 85.3 fL (ref 78.0–100.0)
Platelets: 217 10*3/uL (ref 150–400)
RBC: 4.82 MIL/uL (ref 3.87–5.11)
RDW: 13.1 % (ref 11.5–15.5)
WBC: 6 10*3/uL (ref 4.0–10.5)

## 2014-04-06 LAB — BASIC METABOLIC PANEL
BUN: 16 mg/dL (ref 6–23)
CHLORIDE: 102 meq/L (ref 96–112)
CO2: 31 mEq/L (ref 19–32)
Calcium: 9.5 mg/dL (ref 8.4–10.5)
Creat: 0.87 mg/dL (ref 0.50–1.10)
Glucose, Bld: 87 mg/dL (ref 70–99)
POTASSIUM: 4.2 meq/L (ref 3.5–5.3)
SODIUM: 139 meq/L (ref 135–145)

## 2014-04-06 MED ORDER — DILTIAZEM HCL ER COATED BEADS 120 MG PO CP24: 120.0000 mg | ORAL_CAPSULE | Freq: Every day | ORAL | Status: DC

## 2014-04-06 NOTE — Patient Instructions (Signed)
Your physician has recommended you make the following change in your medication:  1.) decrease Cardizem (diltiazem)  Your physician recommends that you return for lab work in: today (BMET, CBC).  Your physician wants you to follow-up in: 6 months with Dr. [name].   You will receive a reminder letter in the mail two months in advance. If you don't receive a letter, please call our office to schedule the follow-up appointment.

## 2014-04-06 NOTE — Progress Notes (Signed)
[place], [place], [place]  [address] Phone: [phone] Fax:  [phone]  Date:  04/06/2014   ID:  LAVINE HARGROVE, DOB 1945/12/11, MRN [id number]  PCP:  Vidal Schwalbe, MD  Cardiologist:  [name], MD     History of Present Illness: Erin Alvarez is a 68 y.o. female with a history of atypical CP with a stress echo in the past with no ischemia. She has a history of rheumatic fever as a child. She has grade 2 diastolic dysfunction by echo. She was diagnosed with PAF in 2013 and has been on Xarelto for a CHADS2Vasc score of 3. She has severe OSA with an initial AHI of 29/hr on her sleep study and is on CPAP. At her last OV she was complaining of vague chest discomfort and a nuclear stress test did not show any ischemia.  Her CP has resolved.  She is doing well today but continues to complain of fatigue despite stopping her metoprolol.   She denies any SOB, DOE, dizziness, palpitations or syncope.  She tolerates her CPAP well. She tolerates the nasal pillow mask and feels the pressure is adequate. She feels refreshed when she gets up in the am and has no daytime sleepiness.    Wt Readings from Last 3 Encounters:  04/06/14 170 lb (77.111 kg)  10/23/13 166 lb (75.297 kg)  10/03/13 174 lb (78.926 kg)     Past Medical History  Diagnosis Date  . Disorder of bone and cartilage, unspecified   . Allergic rhinitis, cause unspecified   . Unspecified sinusitis (chronic)   . Leukocytopenia, unspecified   . Pneumonia, organism unspecified   . Other specified disorder of skin   . Family history of malignant neoplasm of breast   . Diverticulosis of colon (without mention of hemorrhage)   . Lung nodule 06/2009    CT of chest, 4 mm subpleural nodule in the superior segment of the right lower lobe  . Paroxysmal atrial fibrillation 05/04/2012    on systemic anticoagulation w Xarelto for CHADS VASC score of 3  . Elbow fracture, right 12/12  . Heart murmur     dx as child - s/p  rheumatic fever  . Osteopenia   . OSA (obstructive sleep apnea)     Severe w AHI 29/hr and successful CPAP now on auto CPAP  . Essential hypertension, benign   . H/O: rheumatic fever     Current Outpatient Prescriptions  Medication Sig Dispense Refill  . diltiazem (CARDIZEM CD) 180 MG 24 hr capsule TAKE 1 CAPSULE (180 MG TOTAL) BY MOUTH DAILY.  90 capsule  0  . estradiol (ESTRACE) 0.1 MG/GM vaginal cream Place 1 Applicatorful vaginally once a week.       . flecainide (TAMBOCOR) 50 MG tablet Take 1 tablet (50 mg total) by mouth 2 (two) times daily.  180 tablet  3  . furosemide (LASIX) 20 MG tablet Take 20 mg by mouth as needed for edema.      Marland Kitchen losartan-hydrochlorothiazide (HYZAAR) 50-12.5 MG per tablet Take 1 tablet by mouth daily.  90 tablet  3  . Magnesium 400 MG CAPS Take 400 mg by mouth daily.      . mometasone (NASONEX) 50 MCG/ACT nasal spray Place 2 sprays into the nose as needed.       . Multiple Vitamins-Minerals (CENTRUM PO) Take 1 tablet by mouth daily.        . NON FORMULARY CPAP      .  Omega-3 Fatty Acids (FISH OIL) 1200 MG CAPS Take 2 capsules by mouth daily.        [name] 20 MG TABS tablet TAKE 1 TABLET (20 MG TOTAL) BY MOUTH DAILY WITH SUPPER.  90 tablet  1  . cetirizine (ZYRTEC) 10 MG tablet Take 10 mg by mouth daily as needed. For allergies       No current facility-administered medications for this visit.    Allergies:    Allergies  Allergen Reactions  . Ciprofloxacin   . Naproxen     Social History:  The patient  reports that she quit smoking about 39 years ago. She does not have any smokeless tobacco history on file. She reports that she drinks alcohol. She reports that she does not use illicit drugs.   Family History:  The patient's family history includes COPD in her father; Cancer in her mother; Heart disease (age of onset: 82) in her mother; Hyperlipidemia in her brother and other; Hypertension in her brother, father, and other.   ROS:  Please see the  history of present illness.      All other systems reviewed and negative.   PHYSICAL EXAM: VS:  BP 120/68  Pulse 55  Ht 5\' 5"  (1.651 m)  Wt 170 lb (77.111 kg)  BMI 28.29 kg/m2 Well nourished, well developed, in no acute distress HEENT: normal Neck: no JVD Cardiac:  normal S1, S2; RRR; no murmur Lungs:  clear to auscultation bilaterally, no wheezing, rhonchi or rales Abd: soft, nontender, no hepatomegaly Ext: no edema Skin: warm and dry Neuro:  CNs 2-12 intact, no focal abnormalities noted  ASSESSMENT AND PLAN:  1. PAF maintaining sinus bradycardia - continue Diltiazem/Flecainide and Xarelto  - Check NOAC panel  - decrease Cardizem to 120mg  daily 2. Symptomatic bradycardia - ? Whether it is contributing to her fatigue.  I will back down on the dose of the Cardizem.  She will call me in a few weeks to let me know how she is feeling. 3. HTN well controlled - continue diltiazem/Hyzaar  4.   OSA on CPAP and tolerating well. I will get a download from her DME  Followup with me in 6 months    Signed, [name], MD [facility] 04/06/2014 3:55 PM

## 2014-04-12 ENCOUNTER — Telehealth: Payer: Self-pay | Admitting: Cardiology

## 2014-04-12 NOTE — Telephone Encounter (Signed)
Patient st she was seen last week and was instructed to "get a read off her CPAP card." She st she recently got a call from [facility] to bring her card by, but she is with [facility].  Erin Alvarez is confused as to why [facility] is calling.  Sent a staff message to Darlina Guys and Jiles Crocker from [facility] for clarification. Left a message with [facility] to call our office tomorrow for clarification.   Instructed the patient I will call her back with answers once I receive them.

## 2014-04-12 NOTE — Telephone Encounter (Signed)
New message      Pt is with [facility].  [facility].  Please call

## 2014-04-20 NOTE — Telephone Encounter (Signed)
Spoke with Ivin Booty from [facility] today.  She has spoken with me and with the patient and everything has been clarified.

## 2014-06-06 ENCOUNTER — Other Ambulatory Visit: Payer: Self-pay | Admitting: *Deleted

## 2014-06-06 MED ORDER — LOSARTAN POTASSIUM-HCTZ 50-12.5 MG PO TABS
1.0000 | ORAL_TABLET | Freq: Every day | ORAL | Status: DC
Start: 2014-06-06 — End: 2014-06-27

## 2014-06-27 ENCOUNTER — Other Ambulatory Visit: Payer: Self-pay | Admitting: *Deleted

## 2014-06-27 MED ORDER — LOSARTAN POTASSIUM-HCTZ 50-12.5 MG PO TABS: 1.0000 | ORAL_TABLET | Freq: Every day | ORAL | Status: DC

## 2014-06-27 MED ORDER — FLECAINIDE ACETATE 50 MG PO TABS: 50.0000 mg | ORAL_TABLET | Freq: Two times a day (BID) | ORAL | Status: DC

## 2014-07-19 ENCOUNTER — Encounter: Payer: Self-pay | Admitting: Cardiology

## 2014-09-06 ENCOUNTER — Other Ambulatory Visit: Payer: Self-pay | Admitting: Cardiology

## 2014-10-03 NOTE — Progress Notes (Signed)
This encounter was created in error - please disregard.

## 2014-10-04 ENCOUNTER — Encounter: Payer: Self-pay | Admitting: Cardiology

## 2014-10-04 ENCOUNTER — Ambulatory Visit: Payer: Medicare HMO | Admitting: Cardiology

## 2014-10-04 ENCOUNTER — Encounter: Payer: Medicare HMO | Admitting: Cardiology

## 2014-10-04 ENCOUNTER — Ambulatory Visit (INDEPENDENT_AMBULATORY_CARE_PROVIDER_SITE_OTHER): Payer: Medicare HMO | Admitting: Cardiology

## 2014-10-04 ENCOUNTER — Other Ambulatory Visit: Payer: Self-pay

## 2014-10-04 VITALS — BP 120/62 | HR 59 | Ht 65.0 in | Wt 178.1 lb

## 2014-10-04 DIAGNOSIS — I480 Paroxysmal atrial fibrillation: Secondary | ICD-10-CM

## 2014-10-04 DIAGNOSIS — I10 Essential (primary) hypertension: Secondary | ICD-10-CM | POA: Diagnosis not present

## 2014-10-04 DIAGNOSIS — G4733 Obstructive sleep apnea (adult) (pediatric): Secondary | ICD-10-CM | POA: Diagnosis not present

## 2014-10-04 DIAGNOSIS — R001 Bradycardia, unspecified: Secondary | ICD-10-CM | POA: Diagnosis not present

## 2014-10-04 LAB — CBC WITH DIFFERENTIAL/PLATELET
Basophils Absolute: 0 10*3/uL (ref 0.0–0.1)
Basophils Relative: 0.5 % (ref 0.0–3.0)
EOS ABS: 0 10*3/uL (ref 0.0–0.7)
Eosinophils Relative: 0.7 % (ref 0.0–5.0)
HCT: 42.8 % (ref 36.0–46.0)
HEMOGLOBIN: 14.6 g/dL (ref 12.0–15.0)
Lymphocytes Relative: 25.7 % (ref 12.0–46.0)
Lymphs Abs: 1.5 10*3/uL (ref 0.7–4.0)
MCHC: 34.2 g/dL (ref 30.0–36.0)
MCV: 87.3 fl (ref 78.0–100.0)
MONO ABS: 0.4 10*3/uL (ref 0.1–1.0)
Monocytes Relative: 7.3 % (ref 3.0–12.0)
NEUTROS PCT: 65.8 % (ref 43.0–77.0)
Neutro Abs: 3.9 10*3/uL (ref 1.4–7.7)
PLATELETS: 218 10*3/uL (ref 150.0–400.0)
RBC: 4.91 Mil/uL (ref 3.87–5.11)
RDW: 13.2 % (ref 11.5–15.5)
WBC: 6 10*3/uL (ref 4.0–10.5)

## 2014-10-04 LAB — BASIC METABOLIC PANEL
BUN: 13 mg/dL (ref 6–23)
CHLORIDE: 100 meq/L (ref 96–112)
CO2: 34 meq/L — AB (ref 19–32)
Calcium: 10 mg/dL (ref 8.4–10.5)
Creatinine, Ser: 0.71 mg/dL (ref 0.40–1.20)
GFR: 86.3 mL/min (ref >60.00)
Glucose, Bld: 94 mg/dL (ref 70–99)
Potassium: 3.9 mEq/L (ref 3.5–5.1)
SODIUM: 139 meq/L (ref 135–145)

## 2014-10-04 LAB — TSH: TSH: 2.43 u[IU]/mL (ref 0.35–4.50)

## 2014-10-04 MED ORDER — RIVAROXABAN 20 MG PO TABS: ORAL_TABLET | ORAL | Status: DC

## 2014-10-04 NOTE — Patient Instructions (Signed)
Medication Instructions:  Your physician recommends that you continue on your current medications as directed. Please refer to the Current Medication list given to you today.   Labwork: TODAY: BMET, CBC, TSH  Testing/Procedures: None  Follow-Up: Your physician wants you to follow-up in: 6 months with Dr. [name]. You will receive a reminder letter in the mail two months in advance. If you don't receive a letter, please call our office to schedule the follow-up appointment.

## 2014-10-04 NOTE — Progress Notes (Signed)
Cardiology Office Note   Date:  10/04/2014   ID:  Erin Alvarez, DOB 10-22-1945, MRN [id number]  PCP:  [name], MD    Chief Complaint  Patient presents with  . Atrial Fibrillation  . Hypertension  . Sleep Apnea      History of Present Illness: Erin Alvarez is a 69 y.o. female with a history of atypical CP with a stress echo in the past with no ischemia. She has a history of rheumatic fever as a child. She has grade 2 diastolic dysfunction by echo. She was diagnosed with PAF in 2013 and has been on Xarelto for a CHADS2Vasc score of 3. She has severe OSA with an initial AHI of 29/hr on her sleep study and is on CPAP.  She is doing well today. She denies any chest pain, SOB, DOE, dizziness, palpitations or syncope. She tolerates her CPAP well. She tolerates the nasal pillow mask and feels the pressure is adequate. She feels refreshed when she gets up in the am.  She gets sleepy in the afternoon and sometimes takes a nap.    Past Medical History  Diagnosis Date  . Disorder of bone and cartilage, unspecified   . Allergic rhinitis, cause unspecified   . Unspecified sinusitis (chronic)   . Leukocytopenia, unspecified   . Pneumonia, organism unspecified   . Other specified disorder of skin   . Family history of malignant neoplasm of breast   . Diverticulosis of colon (without mention of hemorrhage)   . Lung nodule 06/2009    CT of chest, 4 mm subpleural nodule in the superior segment of the right lower lobe  . Paroxysmal atrial fibrillation 05/04/2012    on systemic anticoagulation w Xarelto for CHADS VASC score of 3  . Elbow fracture, right 12/12  . Heart murmur     dx as child - s/p rheumatic fever  . Osteopenia   . OSA (obstructive sleep apnea)     Severe w AHI 29/hr and successful CPAP now on auto CPAP  . Essential hypertension, benign   . H/O: rheumatic fever     Past Surgical History  Procedure Laterality Date  . Foot surgery    . Tubal ligation    .  Tonsillectomy    . Breast surgery      biopsy - benign  . Dilation and curettage of uterus       Current Outpatient Prescriptions  Medication Sig Dispense Refill  . cetirizine (ZYRTEC) 10 MG tablet Take 10 mg by mouth daily as needed. For allergies    . diltiazem (CARDIZEM CD) 120 MG 24 hr capsule Take 1 capsule (120 mg total) by mouth daily. 90 capsule 3  . estradiol (ESTRACE) 0.1 MG/GM vaginal cream Place 1 Applicatorful vaginally once a week.     . flecainide (TAMBOCOR) 50 MG tablet Take 1 tablet (50 mg total) by mouth 2 (two) times daily. 180 tablet 3  . furosemide (LASIX) 20 MG tablet Take 20 mg by mouth as needed for edema.    . hydrocortisone 2.5 % cream Apply 1 application topically 2 (two) times daily. Apply to lower lip BID  2  . losartan-hydrochlorothiazide (HYZAAR) 50-12.5 MG per tablet Take 1 tablet by mouth daily. 90 tablet 3  . Magnesium 400 MG CAPS Take 400 mg by mouth daily.    . metroNIDAZOLE (METROGEL) 0.75 % gel Apply 1 application topically 2 (two) times daily.  2  . mometasone (NASONEX) 50 MCG/ACT nasal spray Place  2 sprays into the nose as needed.     . Multiple Vitamins-Minerals (CENTRUM PO) Take 1 tablet by mouth daily.      . NON FORMULARY CPAP    . Omega-3 Fatty Acids (FISH OIL) 1200 MG CAPS Take 2 capsules by mouth daily.      . rivaroxaban (XARELTO) 20 MG TABS tablet TAKE 1 TABLET BY MOUTH DAILY WITH SUPPER 30 tablet 6  . triamcinolone cream (KENALOG) 0.1 % Apply 1 application topically 2 (two) times daily.  2   No current facility-administered medications for this visit.    Allergies:   Ciprofloxacin and Naproxen    Social History:  The patient  reports that she quit smoking about 40 years ago. She does not have any smokeless tobacco history on file. She reports that she drinks alcohol. She reports that she does not use illicit drugs.   Family History:  The patient's family history includes COPD in her father; Cancer in her mother; Heart disease (age of  onset: 25) in her mother; Hyperlipidemia in her brother and other; Hypertension in her brother, father, and other.    ROS:  Please see the history of present illness.   Otherwise, review of systems are positive for weight gain and back pain.   All other systems are reviewed and negative.    PHYSICAL EXAM: VS:  BP 120/62 mmHg  Pulse 59  Ht 5\' 5"  (1.651 m)  Wt 178 lb 1.9 oz (80.795 kg)  BMI 29.64 kg/m2  SpO2 97% , BMI Body mass index is 29.64 kg/(m^2). GEN: Well nourished, well developed, in no acute distress HEENT: normal Neck: no JVD, carotid bruits, or masses Cardiac: RRR; no murmurs, rubs, or gallops,no edema  Respiratory:  clear to auscultation bilaterally, normal work of breathing GI: soft, nontender, nondistended, + BS MS: no deformity or atrophy Skin: warm and dry, no rash Neuro:  Strength and sensation are intact Psych: euthymic mood, full affect   EKG:  EKG was ordered today and showed sinus bradycardia at 54bpm with rSR' in V1 and septal infarct and prolonged PR interval.  Stable intervals compared to EKG 09/2012    Recent Labs: 04/06/2014: BUN 16; Creatinine 0.87; Hemoglobin 14.5; Platelets 217; Potassium 4.2; Sodium 139    Lipid Panel    Component Value Date/Time   CHOL 169 07/03/2010 2029   TRIG 52 07/03/2010 2029   HDL 72 07/03/2010 2029   CHOLHDL 2.3 Ratio 07/03/2010 2029   VLDL 10 07/03/2010 2029   LDLCALC 87 07/03/2010 2029      Wt Readings from Last 3 Encounters:  10/04/14 178 lb 1.9 oz (80.795 kg)  04/06/14 170 lb (77.111 kg)  10/23/13 166 lb (75.297 kg)     ASSESSMENT AND PLAN:  1. PAF maintaining sinus bradycardia - continue Diltiazem/Flecainide and Xarelto  - Check NOAC panel  2. Asymptomatic bradycardia  3. HTN well controlled - continue diltiazem/Hyzaar  4. OSA on CPAP and tolerating well. I will get a download from her DME       5.   Recent weight gain - I will check TSH   Current medicines are reviewed at length with the  patient today.  The patient does not have concerns regarding medicines.  The following changes have been made:  no change  Labs/ tests ordered today include: see above assessment and plan  Orders Placed This Encounter  Procedures  . Basic Metabolic Panel (BMET)  . CBC w/Diff  . TSH  . EKG 12-Lead  Disposition:   FU with me in 6 months   Signed, Sueanne Margarita, MD  10/04/2014 2:47 PM    [facility] [place], [place], [place]  [address] Phone: [phone]; Fax: [phone]

## 2014-10-16 ENCOUNTER — Encounter: Payer: Self-pay | Admitting: Cardiology

## 2014-12-05 ENCOUNTER — Other Ambulatory Visit: Payer: Self-pay

## 2014-12-05 ENCOUNTER — Other Ambulatory Visit: Payer: Self-pay | Admitting: Cardiology

## 2014-12-05 MED ORDER — FUROSEMIDE 20 MG PO TABS: 20.0000 mg | ORAL_TABLET | ORAL | Status: DC | PRN

## 2015-01-23 ENCOUNTER — Encounter: Payer: Self-pay | Admitting: Cardiology

## 2015-03-03 ENCOUNTER — Other Ambulatory Visit: Payer: Self-pay | Admitting: Cardiology

## 2015-04-03 ENCOUNTER — Encounter: Payer: Self-pay | Admitting: Cardiology

## 2015-04-03 ENCOUNTER — Ambulatory Visit (INDEPENDENT_AMBULATORY_CARE_PROVIDER_SITE_OTHER): Payer: Medicare HMO | Admitting: Cardiology

## 2015-04-03 VITALS — BP 130/80 | HR 56 | Ht 65.0 in | Wt 181.8 lb

## 2015-04-03 DIAGNOSIS — I10 Essential (primary) hypertension: Secondary | ICD-10-CM | POA: Diagnosis not present

## 2015-04-03 DIAGNOSIS — G4733 Obstructive sleep apnea (adult) (pediatric): Secondary | ICD-10-CM | POA: Diagnosis not present

## 2015-04-03 DIAGNOSIS — I480 Paroxysmal atrial fibrillation: Secondary | ICD-10-CM | POA: Diagnosis not present

## 2015-04-03 DIAGNOSIS — I4891 Unspecified atrial fibrillation: Secondary | ICD-10-CM | POA: Diagnosis not present

## 2015-04-03 LAB — CBC WITH DIFFERENTIAL/PLATELET
Basophils Absolute: 0 10*3/uL (ref 0.0–0.1)
Basophils Relative: 1 % (ref 0–1)
EOS PCT: 1 % (ref 0–5)
Eosinophils Absolute: 0 10*3/uL (ref 0.0–0.7)
HEMATOCRIT: 43.1 % (ref 36.0–46.0)
HEMOGLOBIN: 14.4 g/dL (ref 12.0–15.0)
LYMPHS ABS: 1.3 10*3/uL (ref 0.7–4.0)
LYMPHS PCT: 29 % (ref 12–46)
MCH: 29.4 pg (ref 26.0–34.0)
MCHC: 33.4 g/dL (ref 30.0–36.0)
MCV: 88 fL (ref 78.0–100.0)
MONO ABS: 0.4 10*3/uL (ref 0.1–1.0)
MPV: 10.5 fL (ref 8.6–12.4)
Monocytes Relative: 9 % (ref 3–12)
NEUTROS ABS: 2.6 10*3/uL (ref 1.7–7.7)
Neutrophils Relative %: 60 % (ref 43–77)
Platelets: 218 10*3/uL (ref 150–400)
RBC: 4.9 MIL/uL (ref 3.87–5.11)
RDW: 13 % (ref 11.5–15.5)
WBC: 4.4 10*3/uL (ref 4.0–10.5)

## 2015-04-03 LAB — BASIC METABOLIC PANEL
BUN: 13 mg/dL (ref 7–25)
CALCIUM: 9.3 mg/dL (ref 8.6–10.4)
CHLORIDE: 102 mmol/L (ref 98–110)
CO2: 29 mmol/L (ref 20–31)
CREATININE: 0.82 mg/dL (ref 0.60–0.93)
Glucose, Bld: 85 mg/dL (ref 65–99)
Potassium: 4.2 mmol/L (ref 3.5–5.3)
Sodium: 140 mmol/L (ref 135–146)

## 2015-04-03 NOTE — Patient Instructions (Signed)
Medication Instructions:  Your physician recommends that you continue on your current medications as directed. Please refer to the Current Medication list given to you today.   Labwork: TODAY: BMET, CBC  Testing/Procedures: None  Follow-Up: Your physician wants you to follow-up in: 6 months with Dr. [name]. You will receive a reminder letter in the mail two months in advance. If you don't receive a letter, please call our office to schedule the follow-up appointment.   Any Other Special Instructions Will Be Listed Below (If Applicable). [facility] will be in touch with you soon for CPAP supplies. Romelle Starcher, CMA, is our office CPAP assistant. If you need anything CPAP related, please do not hesitate to call her directly at [phone].

## 2015-04-03 NOTE — Progress Notes (Signed)
Cardiology Office Note   Date:  04/03/2015   ID:  Erin Alvarez, DOB [birth year], MRN [id number]  PCP:  [name], MD    Chief Complaint  Patient presents with  . Sleep Apnea  . Atrial Fibrillation      History of Present Illness: Erin Alvarez is a 69 y.o. female with a history of atypical CP with a stress echo in the past with no ischemia. She has a history of rheumatic fever as a child. She has grade 2 diastolic dysfunction by echo. She was diagnosed with PAF in 2013 and has been on Xarelto for a CHADS2Vasc score of 3. She has severe OSA with an initial AHI of 29/hr on her sleep study and is on CPAP.  She is doing well today. She denies any chest pain, SOB, DOE, dizziness, palpitations or syncope. She has chronic LE edema but continues to use table salt.  She tolerates her CPAP well. She tolerates the nasal pillow mask and feels the pressure is adequate. She feels refreshed when she gets up in the am if she has slept well. She gets sleepy in the afternoon and sometimes takes a nap.     Past Medical History  Diagnosis Date  . Disorder of bone and cartilage, unspecified   . Allergic rhinitis, cause unspecified   . Unspecified sinusitis (chronic)   . Leukocytopenia, unspecified   . Pneumonia, organism unspecified   . Other specified disorder of skin   . Family history of malignant neoplasm of breast   . Diverticulosis of colon (without mention of hemorrhage)   . Lung nodule 06/2009    CT of chest, 4 mm subpleural nodule in the superior segment of the right lower lobe  . Paroxysmal atrial fibrillation ([place]) 05/04/2012    on systemic anticoagulation w Xarelto for CHADS VASC score of 3  . Elbow fracture, right 12/12  . Heart murmur     dx as child - s/p rheumatic fever  . Osteopenia   . OSA (obstructive sleep apnea)     Severe w AHI 29/hr and successful CPAP now on auto CPAP  . Essential hypertension, benign   . H/O: rheumatic fever     Past  Surgical History  Procedure Laterality Date  . Foot surgery    . Tubal ligation    . Tonsillectomy    . Breast surgery      biopsy - benign  . Dilation and curettage of uterus       Current Outpatient Prescriptions  Medication Sig Dispense Refill  . cetirizine (ZYRTEC) 10 MG tablet Take 10 mg by mouth daily as needed. For allergies    . diltiazem (CARDIZEM CD) 120 MG 24 hr capsule Take 1 capsule (120 mg total) by mouth daily. 90 capsule 3  . estradiol (ESTRACE) 0.1 MG/GM vaginal cream Place 1 Applicatorful vaginally once a week.     . flecainide (TAMBOCOR) 50 MG tablet Take 1 tablet (50 mg total) by mouth 2 (two) times daily. 180 tablet 3  . furosemide (LASIX) 20 MG tablet TAKE 1 TABLET BY MOUTH DAILY AS NEEDED FOR EDEMA 90 tablet 0  . hydrocortisone 2.5 % cream Apply 1 application topically 2 (two) times daily. Apply to lower lip BID  2  . losartan-hydrochlorothiazide (HYZAAR) 50-12.5 MG per tablet Take 1 tablet by mouth daily. 90 tablet 3  . Magnesium 400 MG CAPS Take 400 mg  by mouth daily.    . metroNIDAZOLE (METROGEL) 0.75 % gel Apply 1 application topically 2 (two) times daily.  2  . mometasone (NASONEX) 50 MCG/ACT nasal spray Place 2 sprays into the nose as needed.     . Multiple Vitamins-Minerals (CENTRUM PO) Take 1 tablet by mouth daily.      . NON FORMULARY CPAP    . Omega-3 Fatty Acids (FISH OIL) 1200 MG CAPS Take 2 capsules by mouth daily.      . rivaroxaban (XARELTO) 20 MG TABS tablet TAKE 1 TABLET BY MOUTH DAILY WITH SUPPER 30 tablet 6  . triamcinolone cream (KENALOG) 0.1 % Apply 1 application topically 2 (two) times daily.  2   No current facility-administered medications for this visit.    Allergies:   Ciprofloxacin and Naproxen    Social History:  The patient  reports that she quit smoking about 40 years ago. She does not have any smokeless tobacco history on file. She reports that she drinks alcohol. She reports that she does not use illicit drugs.   Family  History:  The patient's family history includes COPD in her father; Cancer in her mother; Heart disease (age of onset: 39) in her mother; Hyperlipidemia in her brother and other; Hypertension in her brother, father, and other.    ROS:  Please see the history of present illness.   Otherwise, review of systems are positive for none.   All other systems are reviewed and negative.    PHYSICAL EXAM: VS:  BP 130/80 mmHg  Pulse 56  Ht 5\' 5"  (1.651 m)  Wt 181 lb 12.8 oz (82.464 kg)  BMI 30.25 kg/m2  SpO2 98% , BMI Body mass index is 30.25 kg/(m^2). GEN: Well nourished, well developed, in no acute distress HEENT: normal Neck: no JVD, carotid bruits, or masses Cardiac: RRR; no murmurs, rubs, or gallops,no edema  Respiratory:  clear to auscultation bilaterally, normal work of breathing GI: soft, nontender, nondistended, + BS MS: no deformity or atrophy Skin: warm and dry, no rash Neuro:  Strength and sensation are intact Psych: euthymic mood, full affect   EKG:  EKG is not ordered today.    Recent Labs: 10/04/2014: BUN 13; Creatinine, Ser 0.71; Hemoglobin 14.6; Platelets 218.0; Potassium 3.9; Sodium 139; TSH 2.43    Lipid Panel    Component Value Date/Time   CHOL 169 07/03/2010 2029   TRIG 52 07/03/2010 2029   HDL 72 07/03/2010 2029   CHOLHDL 2.3 Ratio 07/03/2010 2029   VLDL 10 07/03/2010 2029   LDLCALC 87 07/03/2010 2029      Wt Readings from Last 3 Encounters:  04/03/15 181 lb 12.8 oz (82.464 kg)  10/04/14 178 lb 1.9 oz (80.795 kg)  04/06/14 170 lb (77.111 kg)    ASSESSMENT AND PLAN:  1. PAF maintaining sinus bradycardia - continue Diltiazem/Flecainide and Xarelto  - Check NOAC panel  2. Asymptomatic bradycardia 3. HTN well controlled - continue diltiazem/Hyzaar  4. OSA on CPAP and tolerating well. I will get a download from her DME.  She wants to transfer to [facility] so I will send an order to them.    Current medicines are reviewed at length with the patient  today.  The patient does not have concerns regarding medicines.  The following changes have been made:  no change  Labs/ tests ordered today: See above Assessment and Plan No orders of the defined types were placed in this encounter.     Disposition:   FU with me  in 6 months  Signed, Sueanne Margarita, MD  04/03/2015 9:35 AM    [facility] [place], [place], [place]  [address] Phone: [phone]; Fax: [phone]

## 2015-04-15 DIAGNOSIS — H2513 Age-related nuclear cataract, bilateral: Secondary | ICD-10-CM | POA: Diagnosis not present

## 2015-04-15 DIAGNOSIS — H01001 Unspecified blepharitis right upper eyelid: Secondary | ICD-10-CM | POA: Diagnosis not present

## 2015-04-15 DIAGNOSIS — H524 Presbyopia: Secondary | ICD-10-CM | POA: Diagnosis not present

## 2015-04-15 DIAGNOSIS — H43813 Vitreous degeneration, bilateral: Secondary | ICD-10-CM | POA: Diagnosis not present

## 2015-04-17 DIAGNOSIS — H1132 Conjunctival hemorrhage, left eye: Secondary | ICD-10-CM | POA: Diagnosis not present

## 2015-04-29 DIAGNOSIS — Z01419 Encounter for gynecological examination (general) (routine) without abnormal findings: Secondary | ICD-10-CM | POA: Diagnosis not present

## 2015-04-29 DIAGNOSIS — Z124 Encounter for screening for malignant neoplasm of cervix: Secondary | ICD-10-CM | POA: Diagnosis not present

## 2015-04-29 DIAGNOSIS — Z6830 Body mass index (BMI) 30.0-30.9, adult: Secondary | ICD-10-CM | POA: Diagnosis not present

## 2015-05-15 ENCOUNTER — Telehealth: Payer: Self-pay | Admitting: Cardiology

## 2015-05-15 ENCOUNTER — Other Ambulatory Visit (INDEPENDENT_AMBULATORY_CARE_PROVIDER_SITE_OTHER): Payer: Medicare HMO | Admitting: *Deleted

## 2015-05-15 DIAGNOSIS — R6884 Jaw pain: Secondary | ICD-10-CM | POA: Diagnosis not present

## 2015-05-15 LAB — TROPONIN I: Troponin I: <0.01 ng/mL (ref <0.06)

## 2015-05-15 NOTE — Telephone Encounter (Signed)
New Message    Pt calling stating that yesterday she was having some jaw pain and shoulder pain and was very exhausted and wants to know if she can be seen today. Please call back and advise.

## 2015-05-15 NOTE — Telephone Encounter (Signed)
Pt called back. Informed pt of results. Pt verbalized understanding.

## 2015-05-15 NOTE — Telephone Encounter (Signed)
Patient called c/o intermittent jaw and L shoulder pain yesterday. She was also tired and extremely weak. Yesterday, her BP was 133/65 and HR 53. Today, she has no symptoms other than slight shoulder pain (she st it feels like she slept on it wrong. She also st she has "a lot of problems with that shoulder").  She has not experienced any CP or SOB.  She is taking her medications as directed.  To Dr. [name] for recommendations.

## 2015-05-15 NOTE — Telephone Encounter (Signed)
Left message to call back  

## 2015-05-15 NOTE — Telephone Encounter (Signed)
New Message    Pt calling to get lab results from today. Please call back and advise.

## 2015-05-15 NOTE — Telephone Encounter (Signed)
Patient coming for troponin today. She understands to go to the ED this weekend if she experiences any chest pain.

## 2015-05-15 NOTE — Telephone Encounter (Signed)
Have patient come in for a stat troponin and if normal set up for stress myoview.  If she gets any CP over the weekend she need to go to the ER

## 2015-05-28 ENCOUNTER — Other Ambulatory Visit: Payer: Self-pay | Admitting: Cardiology

## 2015-06-26 ENCOUNTER — Other Ambulatory Visit: Payer: Self-pay | Admitting: Cardiology

## 2015-07-16 DIAGNOSIS — R69 Illness, unspecified: Secondary | ICD-10-CM | POA: Diagnosis not present

## 2015-07-22 ENCOUNTER — Other Ambulatory Visit: Payer: Self-pay | Admitting: Cardiology

## 2015-08-23 ENCOUNTER — Telehealth: Payer: Self-pay | Admitting: Cardiology

## 2015-08-23 NOTE — Telephone Encounter (Signed)
**Note De-Identified Erin Alvarez Obfuscation** The pt wants to know if Dr [name] wants her to have lab work drawn before her next f/u in May. The pt is advised that I am forwarding this message to Dr [name] and that we will call her back with Dr Landis Gandy recommendations.  Please advise.

## 2015-08-23 NOTE — Telephone Encounter (Signed)
New Messasge  Pt did not know if she needed lab work or not for appt 5/1. Please call back and discuss.

## 2015-08-24 NOTE — Telephone Encounter (Signed)
BMET and CBC but doesn't need to be fasting

## 2015-08-27 NOTE — Telephone Encounter (Signed)
Patient does not wish to have labs drawn before appointment.  She understands she will have blood drawn 5/1 after OV with Dr. [name].

## 2015-09-05 DIAGNOSIS — Z0100 Encounter for examination of eyes and vision without abnormal findings: Secondary | ICD-10-CM | POA: Diagnosis not present

## 2015-09-19 ENCOUNTER — Other Ambulatory Visit: Payer: Self-pay | Admitting: Cardiology

## 2015-10-21 ENCOUNTER — Ambulatory Visit (INDEPENDENT_AMBULATORY_CARE_PROVIDER_SITE_OTHER): Payer: Medicare HMO | Admitting: Cardiology

## 2015-10-21 ENCOUNTER — Encounter: Payer: Self-pay | Admitting: Cardiology

## 2015-10-21 VITALS — BP 132/64 | HR 56 | Ht 65.0 in | Wt 183.4 lb

## 2015-10-21 DIAGNOSIS — R011 Cardiac murmur, unspecified: Secondary | ICD-10-CM

## 2015-10-21 DIAGNOSIS — R8299 Other abnormal findings in urine: Secondary | ICD-10-CM | POA: Diagnosis not present

## 2015-10-21 DIAGNOSIS — I480 Paroxysmal atrial fibrillation: Secondary | ICD-10-CM | POA: Diagnosis not present

## 2015-10-21 DIAGNOSIS — I10 Essential (primary) hypertension: Secondary | ICD-10-CM | POA: Diagnosis not present

## 2015-10-21 DIAGNOSIS — G4733 Obstructive sleep apnea (adult) (pediatric): Secondary | ICD-10-CM

## 2015-10-21 DIAGNOSIS — I272 Other secondary pulmonary hypertension: Secondary | ICD-10-CM

## 2015-10-21 DIAGNOSIS — R079 Chest pain, unspecified: Secondary | ICD-10-CM

## 2015-10-21 DIAGNOSIS — N390 Urinary tract infection, site not specified: Secondary | ICD-10-CM | POA: Diagnosis not present

## 2015-10-21 NOTE — Patient Instructions (Signed)
Medication Instructions:  Your physician recommends that you continue on your current medications as directed. Please refer to the Current Medication list given to you today.   Labwork: None  Testing/Procedures: Your physician has requested that you have an echocardiogram. Echocardiography is a painless test that uses sound waves to create images of your heart. It provides your doctor with information about the size and shape of your heart and how well your heart's chambers and valves are working. This procedure takes approximately one hour. There are no restrictions for this procedure.  Dr. Turner recommends you have a NUCLEAR STRESS TEST.  Follow-Up: Your physician wants you to follow-up in: 6 months with Dr. Turner. You will receive a reminder letter in the mail two months in advance. If you don't receive a letter, please call our office to schedule the follow-up appointment.   Any Other Special Instructions Will Be Listed Below (If Applicable).     If you need a refill on your cardiac medications before your next appointment, please call your pharmacy.   

## 2015-10-21 NOTE — Progress Notes (Signed)
Cardiology Office Note    Date:  10/21/2015   ID:  Erin Alvarez, DOB 01-09-1946, MRN [id number]  PCP:  [name], MD  Cardiologist:  Sueanne Margarita, MD   Chief Complaint  Patient presents with  . Sleep Apnea  . Hypertension  . Atrial Fibrillation    History of Present Illness:  Erin Alvarez is a 70 y.o. female with a history of atypical CP with a stress echo in the past with no ischemia. She has a history of rheumatic fever as a child. She has grade 2 diastolic dysfunction by echo. She was diagnosed with PAF in 2013 and has been on Xarelto for a CHADS2Vasc score of 3. She has severe OSA with an initial AHI of 29/hr on her sleep study and is on CPAP.  She is doing well today. She denies any  SOB, DOE, dizziness, palpitations or syncope. She has had a few episodes of achiness in her chest that can last off and on all day.  She denies any associated SOB, diaphoresis or nausea. She has chronic LE edema which is intermittent. She tolerates her CPAP well. She tolerates the nasal pillow mask and feels the pressure is adequate. She feels refreshed when she gets up in the am if she has slept well. She gets sleepy in the afternoon and sometimes takes a nap.    Past Medical History  Diagnosis Date  . Disorder of bone and cartilage, unspecified   . Allergic rhinitis, cause unspecified   . Unspecified sinusitis (chronic)   . Leukocytopenia, unspecified   . Pneumonia, organism unspecified   . Other specified disorder of skin   . Family history of malignant neoplasm of breast   . Diverticulosis of colon (without mention of hemorrhage)   . Lung nodule 06/2009    CT of chest, 4 mm subpleural nodule in the superior segment of the right lower lobe  . Paroxysmal atrial fibrillation ([place]) 05/04/2012    on systemic anticoagulation w Xarelto for CHADS VASC score of 3  . Elbow fracture, right 12/12  . Heart murmur     dx as child - s/p rheumatic fever  . Osteopenia   . OSA (obstructive  sleep apnea)     Severe w AHI 29/hr and successful CPAP now on auto CPAP  . Essential hypertension, benign   . H/O: rheumatic fever   . Heart murmur 10/21/2015  . Pulmonary HTN ([place]) 10/21/2015    Past Surgical History  Procedure Laterality Date  . Foot surgery    . Tubal ligation    . Tonsillectomy    . Breast surgery      biopsy - benign  . Dilation and curettage of uterus      Current Medications: Outpatient Prescriptions Prior to Visit  Medication Sig Dispense Refill  . cetirizine (ZYRTEC) 10 MG tablet Take 10 mg by mouth daily as needed. For allergies    . diltiazem (CARDIZEM CD) 120 MG 24 hr capsule Take 1 capsule (120 mg total) by mouth daily. 90 capsule 3  . estradiol (ESTRACE) 0.1 MG/GM vaginal cream Place 1 Applicatorful vaginally once a week.     . flecainide (TAMBOCOR) 50 MG tablet TAKE 1 TABLET BY MOUTH TWICE DAILY 180 tablet 2  . furosemide (LASIX) 20 MG tablet TAKE 1 TABLET BY MOUTH DAILY AS NEEDED FOR EDEMA 90 tablet 0  . hydrocortisone 2.5 % cream Apply 1 application topically 2 (two) times daily. Apply to lower lip BID  2  .  losartan-hydrochlorothiazide (HYZAAR) 50-12.5 MG tablet TAKE 1 TABLET BY MOUTH DAILY 90 tablet 0  . Magnesium 400 MG CAPS Take 400 mg by mouth daily.    . metroNIDAZOLE (METROGEL) 0.75 % gel Apply 1 application topically 2 (two) times daily.  2  . mometasone (NASONEX) 50 MCG/ACT nasal spray Place 2 sprays into the nose daily as needed (allergies).     . Multiple Vitamins-Minerals (CENTRUM PO) Take 1 tablet by mouth daily.      . NON FORMULARY CPAP    . Omega-3 Fatty Acids (FISH OIL) 1200 MG CAPS Take 2 capsules by mouth daily.      [name] 20 MG TABS tablet TAKE 1 TABLET BY MOUTH DAILY WITH DINNER 30 tablet 9  . triamcinolone cream (KENALOG) 0.1 % Apply 1 application topically 2 (two) times daily.  2  . diltiazem (TIAZAC) 120 MG 24 hr capsule TAKE 1 CAPSULE BY MOUTH DAILY 90 capsule 0   No facility-administered medications prior to visit.       Allergies:   Ciprofloxacin and Naproxen   Social History   Social History  . Marital Status: Married    Spouse Name: Richard  . Number of Children: 2  . Years of Education: N/A   Occupational History  . Retired Theme park manager    Social History Main Topics  . Smoking status: Former Smoker -- 2.00 packs/day for 15 years    Quit date: 06/22/1974  . Smokeless tobacco: None  . Alcohol Use: Yes     Comment: rare wine  . Drug Use: No  . Sexual Activity: Not Asked   Other Topics Concern  . None   Social History Narrative   Married   2 children   Quit tobacco 1976 - smoked 2 ppd x 15   Retired - Emergency planning/management officer   Alcohol use: yes   Son coming in [place] - living in [place]     Family History:  The patient's family history includes COPD in her father; Cancer in her mother; Heart disease (age of onset: 46) in her mother; Hyperlipidemia in her brother and other; Hypertension in her brother, father, and other.   ROS:   Please see the history of present illness.    ROS All other systems reviewed and are negative.   PHYSICAL EXAM:   VS:  BP 132/64 mmHg  Pulse 56  Ht 5\' 5"  (1.651 m)  Wt 183 lb 6.4 oz (83.19 kg)  BMI 30.52 kg/m2   GEN: Well nourished, well developed, in no acute distress HEENT: normal Neck: no JVD, carotid bruits, or masses Cardiac: RRR; no rubs, or gallops,no edema.  Intact distal pulses bilaterally. 2/6 SM at LLSB Respiratory:  clear to auscultation bilaterally, normal work of breathing GI: soft, nontender, nondistended, + BS MS: no deformity or atrophy Skin: warm and dry, no rash Neuro:  Alert and Oriented x 3, Strength and sensation are intact Psych: euthymic mood, full affect  Wt Readings from Last 3 Encounters:  10/21/15 183 lb 6.4 oz (83.19 kg)  04/03/15 181 lb 12.8 oz (82.464 kg)  10/04/14 178 lb 1.9 oz (80.795 kg)      Studies/Labs Reviewed:   EKG:  EKG is ordered today and showed sinus bradycardia at 56bpm with no ST changes  Recent  Labs: 04/03/2015: BUN 13; Creat 0.82; Hemoglobin 14.4; Platelets 218; Potassium 4.2; Sodium 140   Lipid Panel    Component Value Date/Time   CHOL 169 07/03/2010 2029   TRIG 52 07/03/2010 2029  HDL 72 07/03/2010 2029   CHOLHDL 2.3 Ratio 07/03/2010 2029   VLDL 10 07/03/2010 2029   LDLCALC 87 07/03/2010 2029    Additional studies/ records that were reviewed today include:  none    ASSESSMENT:    1. Paroxysmal atrial fibrillation (HCC)   2. HYPERTENSION, BENIGN ESSENTIAL   3. OSA (obstructive sleep apnea)   4. Chest pain, unspecified chest pain type   5. Heart murmur   6. Pulmonary HTN ([place])      PLAN:  In order of problems listed above:  1.  PAF maintaining NSR.  Continue Xarelto, Flecainide and CCB.  Check NOAC panel   2.  HTN - BP well controlled.  Continue Hyzaar 3.  OSA tolerating CPAP well.   4.  Chest pain - this is somewhat atpyical but she has CRFs including post menopausal, HTN and remote tobacco use. EKG is non ischemic. I will get a stress myoview to rule out ischemia.  5.  Heart Murmur with history of RHD.  Will get echo to assess. 6.  Mild pulmonary HTN on echo 2014 with PASP 45mmHg - will repeat echo    Medication Adjustments/Labs and Tests Ordered: Current medicines are reviewed at length with the patient today.  Concerns regarding medicines are outlined above.  Medication changes, Labs and Tests ordered today are listed in the Patient Instructions below.   Lurena Nida, MD  10/21/2015 10:07 AM    [facility] [place], [place], [place]  [address] Phone: [phone]; Fax: [phone]

## 2015-10-22 ENCOUNTER — Other Ambulatory Visit: Payer: Self-pay

## 2015-10-22 MED ORDER — LOSARTAN POTASSIUM-HCTZ 50-12.5 MG PO TABS: 1.0000 | ORAL_TABLET | Freq: Every day | ORAL | Status: DC

## 2015-10-22 NOTE — Telephone Encounter (Signed)
Erin Margarita, MD at 10/21/2015 9:53 AM  losartan-hydrochlorothiazide (HYZAAR) 50-12.5 MG tabletTAKE 1 TABLET BY MOUTH DAILY 2. HTN - BP well controlled. Continue Hyzaar   Patient Instructions     Medication Instructions:  Your physician recommends that you continue on your current medications as directed. Please refer to the Current Medication list given to you today

## 2015-10-28 DIAGNOSIS — Z1231 Encounter for screening mammogram for malignant neoplasm of breast: Secondary | ICD-10-CM | POA: Diagnosis not present

## 2015-10-28 DIAGNOSIS — I10 Essential (primary) hypertension: Secondary | ICD-10-CM | POA: Diagnosis not present

## 2015-10-28 DIAGNOSIS — M25562 Pain in left knee: Secondary | ICD-10-CM | POA: Diagnosis not present

## 2015-10-28 DIAGNOSIS — Z0000 Encounter for general adult medical examination without abnormal findings: Secondary | ICD-10-CM | POA: Diagnosis not present

## 2015-10-28 DIAGNOSIS — Z803 Family history of malignant neoplasm of breast: Secondary | ICD-10-CM | POA: Diagnosis not present

## 2015-10-28 DIAGNOSIS — Z1389 Encounter for screening for other disorder: Secondary | ICD-10-CM | POA: Diagnosis not present

## 2015-10-28 DIAGNOSIS — G4733 Obstructive sleep apnea (adult) (pediatric): Secondary | ICD-10-CM | POA: Diagnosis not present

## 2015-10-28 DIAGNOSIS — I4891 Unspecified atrial fibrillation: Secondary | ICD-10-CM | POA: Diagnosis not present

## 2015-10-28 DIAGNOSIS — Z6830 Body mass index (BMI) 30.0-30.9, adult: Secondary | ICD-10-CM | POA: Diagnosis not present

## 2015-10-30 DIAGNOSIS — Z1212 Encounter for screening for malignant neoplasm of rectum: Secondary | ICD-10-CM | POA: Diagnosis not present

## 2015-11-06 ENCOUNTER — Telehealth (HOSPITAL_COMMUNITY): Payer: Self-pay | Admitting: *Deleted

## 2015-11-06 NOTE — Telephone Encounter (Signed)
Patient given detailed instructions per Myocardial Perfusion Study Information Sheet for the test on 11/11/15. Patient notified to arrive 15 minutes early and that it is imperative to arrive on time for appointment to keep from having the test rescheduled.  If you need to cancel or reschedule your appointment, please call the office within 24 hours of your appointment. Failure to do so may result in a cancellation of your appointment, and a $50 no show fee. Patient verbalized understanding.Doyce Stonehouse J Houa Nie, RN  

## 2015-11-07 ENCOUNTER — Encounter: Payer: Self-pay | Admitting: Gastroenterology

## 2015-11-07 ENCOUNTER — Ambulatory Visit (INDEPENDENT_AMBULATORY_CARE_PROVIDER_SITE_OTHER): Payer: Medicare HMO | Admitting: Gastroenterology

## 2015-11-07 VITALS — BP 140/72 | HR 56 | Ht 64.25 in | Wt 181.1 lb

## 2015-11-07 DIAGNOSIS — Z1211 Encounter for screening for malignant neoplasm of colon: Secondary | ICD-10-CM

## 2015-11-07 NOTE — Progress Notes (Signed)
Erin Alvarez    [id number]    [birth year]  Primary Care Physician:HOLWERDA, Erin Reaper, MD  Referring Physician: [name], MD [address] [place],  [address]  Chief complaint:  Screening colonoscopy   HPI: 70 year old female with history of rheumatic fever, tricuspid regurgitation, obstructive sleep apnea is here to discuss screening colonoscopy. She was previously followed by Dr. Olevia Perches and was last seen in 2005 for colonoscopy which showed diverticulosis but was otherwise normal. She recently saw Dr. [name] for atypical chest pain/chest heaviness and is scheduled for cardiac stress test next week. Patient denies any GI specific symptoms, denies any nausea, vomiting, abdominal pain, melena or bright red blood per rectum.    Outpatient Encounter Prescriptions as of 11/07/2015  Medication Sig  . cetirizine (ZYRTEC) 10 MG tablet Take 10 mg by mouth daily as needed. For allergies  . diltiazem (CARDIZEM CD) 120 MG 24 hr capsule Take 1 capsule (120 mg total) by mouth daily.  Marland Kitchen estradiol (ESTRACE) 0.1 MG/GM vaginal cream Place 1 Applicatorful vaginally once a week.   . flecainide (TAMBOCOR) 50 MG tablet TAKE 1 TABLET BY MOUTH TWICE DAILY  . furosemide (LASIX) 20 MG tablet TAKE 1 TABLET BY MOUTH DAILY AS NEEDED FOR EDEMA  . hydrocortisone 2.5 % cream Apply 1 application topically 2 (two) times daily. Apply to lower lip BID  . losartan-hydrochlorothiazide (HYZAAR) 50-12.5 MG tablet Take 1 tablet by mouth daily.  . Magnesium 400 MG CAPS Take 400 mg by mouth daily.  . metroNIDAZOLE (METROGEL) 0.75 % gel Apply 1 application topically 2 (two) times daily.  . mometasone (NASONEX) 50 MCG/ACT nasal spray Place 2 sprays into the nose daily as needed (allergies).   . Multiple Vitamins-Minerals (CENTRUM PO) Take 1 tablet by mouth daily.    . NON FORMULARY CPAP at bedtime  . Omega-3 Fatty Acids (FISH OIL) 1200 MG CAPS Take 2 capsules by mouth daily.    [name] 20 MG TABS tablet  TAKE 1 TABLET BY MOUTH DAILY WITH DINNER   No facility-administered encounter medications on file as of 11/07/2015.    Allergies as of 11/07/2015 - Review Complete 11/07/2015  Allergen Reaction Noted  . Ciprofloxacin Rash 05/03/2007  . Naproxen Rash 05/03/2007    Past Medical History  Diagnosis Date  . Disorder of bone and cartilage, unspecified   . Allergic rhinitis, cause unspecified   . Unspecified sinusitis (chronic)   . Leukocytopenia, unspecified   . Pneumonia, organism unspecified   . Other specified disorder of skin   . Family history of malignant neoplasm of breast   . Diverticulosis of colon (without mention of hemorrhage)   . Lung nodule 06/2009    CT of chest, 4 mm subpleural nodule in the superior segment of the right lower lobe  . Paroxysmal atrial fibrillation ([place]) 05/04/2012    on systemic anticoagulation w Xarelto for CHADS VASC score of 3  . Elbow fracture, right 12/12  . Heart murmur     dx as child - s/p rheumatic fever  . Osteopenia   . OSA (obstructive sleep apnea)     Severe w AHI 29/hr and successful CPAP now on auto CPAP  . Essential hypertension, benign   . H/O: rheumatic fever   . Heart murmur 10/21/2015  . Pulmonary HTN ([place]) 10/21/2015    Past Surgical History  Procedure Laterality Date  . Foot surgery    . Tubal ligation    . Tonsillectomy    .  Breast surgery      biopsy - benign  . Dilation and curettage of uterus      Family History  Problem Relation Age of Onset  . Cancer Mother     lung, smoker  . Heart disease Mother 23    CABG  . COPD Father     smoker  . Hypertension Father   . Hyperlipidemia Other   . Hypertension Other   . Hypertension Brother   . Hyperlipidemia Brother     Social History   Social History  . Marital Status: Married    Spouse Name: Richard  . Number of Children: 2  . Years of Education: N/A   Occupational History  . Retired Theme park manager    Social History Main Topics  . Smoking status: Former  Smoker -- 2.00 packs/day for 15 years    Quit date: 06/22/1974  . Smokeless tobacco: Not on file  . Alcohol Use: Yes     Comment: rare wine  . Drug Use: No  . Sexual Activity: Not on file   Other Topics Concern  . Not on file   Social History Narrative   Married   2 children   Quit tobacco 1976 - smoked 2 ppd x 15   Retired - Emergency planning/management officer   Alcohol use: yes   Son coming in overseas - living in [place]      Review of systems: Review of Systems  Constitutional: Negative for fever and chills.  HENT: Negative.   Eyes: Negative for blurred vision.  Respiratory: Negative for cough, shortness of breath and wheezing.   Cardiovascular: Negative for chest pain and palpitations.  Gastrointestinal: as per HPI Genitourinary: Negative for dysuria, urgency, frequency and hematuria.  Musculoskeletal: Negative for myalgias, back pain and joint pain.  Skin: Negative for itching and rash.  Neurological: Negative for dizziness, tremors, focal weakness, seizures and loss of consciousness.  Endo/Heme/Allergies: Negative for environmental allergies.  Psychiatric/Behavioral: Negative for depression, suicidal ideas and hallucinations.  All other systems reviewed and are negative.   Physical Exam: Filed Vitals:   11/07/15 1359  BP: 140/72  Pulse: 56   Gen:      No acute distress HEENT:  EOMI, sclera anicteric Neck:     No masses; no thyromegaly Lungs:    Clear to auscultation bilaterally; normal respiratory effort CV:         irregular rate and rhythm; + systolic murmur Abd:      + bowel sounds; soft, non-tender; no palpable masses, no distension Ext:    No edema; adequate peripheral perfusion Skin:      Warm and dry; no rash Neuro: alert and oriented x 3 Psych: normal mood and affect  Data Reviewed:  Reviewed data and chart   Assessment and Plan/Recommendations:  70 year old female with history of proximal A. fib on Xarelto, rheumatic fever, tricuspid regurgitation, sinus  bradycardia with atypical chest pain and is scheduled to undergo cardiac stress test next week We will hold off scheduling screening colonoscopy until after her cardiac stress test is done  and is cleared by cardiologist to undergo procedure We'll discuss with cardiology regarding holding Xarelto 2 days prior to the colonoscopy Return as needed  K. Denzil Magnuson , MD [phone] Mon-Fri 8a-5p [phone] after 5p, weekends, holidays  CC: Erin Hatchet, MD

## 2015-11-07 NOTE — Patient Instructions (Signed)
It has been recommended to you by your physician that you have a(n) Colonoscopy completed. We will wait to schedule your colonoscopy once we see your stress test results from your cardiologist. If ok 'd by cardiology then we will schedule your colonoscopy at which time we will see how long you can hold xarelto

## 2015-11-11 ENCOUNTER — Ambulatory Visit (HOSPITAL_COMMUNITY): Payer: Medicare HMO

## 2015-11-11 ENCOUNTER — Other Ambulatory Visit: Payer: Self-pay

## 2015-11-11 ENCOUNTER — Ambulatory Visit (HOSPITAL_COMMUNITY): Payer: Medicare HMO | Attending: Cardiology

## 2015-11-11 ENCOUNTER — Encounter: Payer: Self-pay | Admitting: Cardiology

## 2015-11-11 VITALS — Ht 64.0 in | Wt 181.0 lb

## 2015-11-11 DIAGNOSIS — G4440 Drug-induced headache, not elsewhere classified, not intractable: Secondary | ICD-10-CM

## 2015-11-11 DIAGNOSIS — I081 Rheumatic disorders of both mitral and tricuspid valves: Secondary | ICD-10-CM | POA: Diagnosis not present

## 2015-11-11 DIAGNOSIS — I10 Essential (primary) hypertension: Secondary | ICD-10-CM | POA: Diagnosis not present

## 2015-11-11 DIAGNOSIS — R079 Chest pain, unspecified: Secondary | ICD-10-CM | POA: Diagnosis not present

## 2015-11-11 DIAGNOSIS — R011 Cardiac murmur, unspecified: Secondary | ICD-10-CM

## 2015-11-11 DIAGNOSIS — R0602 Shortness of breath: Secondary | ICD-10-CM

## 2015-11-11 LAB — MYOCARDIAL PERFUSION IMAGING
CHL CUP RESTING HR STRESS: 47 {beats}/min
LV sys vol: 41 mL
LVDIAVOL: 102 mL (ref 46–106)
NUC STRESS TID: 1.02
Peak HR: 68 {beats}/min
RATE: 0.33
SDS: 1
SRS: 7
SSS: 8

## 2015-11-11 MED ORDER — TECHNETIUM TC 99M TETROFOSMIN IV KIT
10.3000 | PACK | Freq: Once | INTRAVENOUS | Status: AC | PRN
  Administered 2015-11-11: 10 via INTRAVENOUS
  Filled 2015-11-11: qty 10

## 2015-11-11 MED ORDER — AMINOPHYLLINE 25 MG/ML IV SOLN
75.0000 mg | Freq: Once | INTRAVENOUS | Status: AC
  Administered 2015-11-11: 75 mg via INTRAVENOUS

## 2015-11-11 MED ORDER — TECHNETIUM TC 99M TETROFOSMIN IV KIT
32.9000 | PACK | Freq: Once | INTRAVENOUS | Status: AC | PRN
  Administered 2015-11-11: 32.9 via INTRAVENOUS
  Filled 2015-11-11: qty 33

## 2015-11-11 MED ORDER — REGADENOSON 0.4 MG/5ML IV SOLN
0.4000 mg | Freq: Once | INTRAVENOUS | Status: AC
  Administered 2015-11-11: 0.4 mg via INTRAVENOUS

## 2015-11-12 ENCOUNTER — Telehealth: Payer: Self-pay | Admitting: Cardiology

## 2015-11-12 NOTE — Telephone Encounter (Signed)
Erin Alvarez is returning a call about her test results .Marland Kitchen Please call  Thanks

## 2015-11-12 NOTE — Telephone Encounter (Signed)
-----   Message from Sueanne Margarita, MD sent at 11/11/2015 10:56 PM EDT ----- Echo showed normal LVF with moderately calcified AV, mild MR/TR

## 2015-11-12 NOTE — Telephone Encounter (Signed)
Informed patient of results and verbal understanding expressed.  Patient has no complaints. She understands to call if she experiences CP again. Patient was grateful for call.

## 2015-11-12 NOTE — Telephone Encounter (Signed)
-----   Message from Sueanne Margarita, MD sent at 11/11/2015 10:51 PM EDT ----- Please find out if patient is still having CP

## 2015-11-14 DIAGNOSIS — L80 Vitiligo: Secondary | ICD-10-CM | POA: Diagnosis not present

## 2015-11-14 DIAGNOSIS — L718 Other rosacea: Secondary | ICD-10-CM | POA: Diagnosis not present

## 2015-11-22 ENCOUNTER — Telehealth: Payer: Self-pay | Admitting: *Deleted

## 2015-11-22 NOTE — Telephone Encounter (Signed)
===  View-only below this line===  ----- Message -----    From: Sueanne Margarita, MD    Sent: 11/21/2015   7:28 PM      To: Oda Kilts, CMA Subject: RE: colonoscopy                                Patient is fine to come off Xarelto for colonoscopy  [name] MD ----- Message -----    From: Oda Kilts, CMA    Sent: 11/21/2015   2:25 PM      To: Sueanne Margarita, MD Subject: colonoscopy                                    Patient was to schedule a colonoscopy once cleared by you. Is she ok to have a colonoscopy and will she be able to hold Xarelto prior. Thanks Genella Mech

## 2015-12-21 ENCOUNTER — Other Ambulatory Visit: Payer: Self-pay | Admitting: Cardiology

## 2015-12-23 ENCOUNTER — Other Ambulatory Visit: Payer: Self-pay

## 2015-12-23 MED ORDER — DILTIAZEM HCL ER COATED BEADS 120 MG PO CP24: 120.0000 mg | ORAL_CAPSULE | Freq: Every day | ORAL | Status: DC

## 2015-12-30 DIAGNOSIS — G4733 Obstructive sleep apnea (adult) (pediatric): Secondary | ICD-10-CM | POA: Diagnosis not present

## 2016-01-29 ENCOUNTER — Other Ambulatory Visit: Payer: Self-pay | Admitting: Cardiology

## 2016-02-14 ENCOUNTER — Other Ambulatory Visit: Payer: Self-pay | Admitting: Cardiology

## 2016-04-13 ENCOUNTER — Other Ambulatory Visit: Payer: Self-pay | Admitting: Cardiology

## 2016-05-01 ENCOUNTER — Encounter: Payer: Self-pay | Admitting: Cardiology

## 2016-05-01 ENCOUNTER — Ambulatory Visit (INDEPENDENT_AMBULATORY_CARE_PROVIDER_SITE_OTHER): Payer: Medicare HMO | Admitting: Cardiology

## 2016-05-01 ENCOUNTER — Encounter (INDEPENDENT_AMBULATORY_CARE_PROVIDER_SITE_OTHER): Payer: Self-pay

## 2016-05-01 VITALS — BP 108/62 | HR 60 | Ht 64.0 in | Wt 181.4 lb

## 2016-05-01 DIAGNOSIS — I4819 Other persistent atrial fibrillation: Secondary | ICD-10-CM

## 2016-05-01 DIAGNOSIS — I272 Pulmonary hypertension, unspecified: Secondary | ICD-10-CM | POA: Diagnosis not present

## 2016-05-01 DIAGNOSIS — I10 Essential (primary) hypertension: Secondary | ICD-10-CM

## 2016-05-01 DIAGNOSIS — G4733 Obstructive sleep apnea (adult) (pediatric): Secondary | ICD-10-CM

## 2016-05-01 DIAGNOSIS — I481 Persistent atrial fibrillation: Secondary | ICD-10-CM | POA: Diagnosis not present

## 2016-05-01 LAB — CBC WITH DIFFERENTIAL/PLATELET
Basophils Absolute: 58 cells/uL (ref 0–200)
Basophils Relative: 1 %
Eosinophils Absolute: 116 cells/uL (ref 15–500)
Eosinophils Relative: 2 %
HEMATOCRIT: 42.3 % (ref 35.0–45.0)
Hemoglobin: 14.2 g/dL (ref 11.7–15.5)
LYMPHS PCT: 29 %
Lymphs Abs: 1682 cells/uL (ref 850–3900)
MCH: 29.7 pg (ref 27.0–33.0)
MCHC: 33.6 g/dL (ref 32.0–36.0)
MCV: 88.5 fL (ref 80.0–100.0)
MONO ABS: 522 {cells}/uL (ref 200–950)
MPV: 9.8 fL (ref 7.5–12.5)
Monocytes Relative: 9 %
NEUTROS PCT: 59 %
Neutro Abs: 3422 cells/uL (ref 1500–7800)
Platelets: 238 10*3/uL (ref 140–400)
RBC: 4.78 MIL/uL (ref 3.80–5.10)
RDW: 13.1 % (ref 11.0–15.0)
WBC: 5.8 10*3/uL (ref 3.8–10.8)

## 2016-05-01 LAB — BASIC METABOLIC PANEL
BUN: 14 mg/dL (ref 7–25)
CALCIUM: 9.2 mg/dL (ref 8.6–10.4)
CO2: 29 mmol/L (ref 20–31)
Chloride: 101 mmol/L (ref 98–110)
Creat: 0.74 mg/dL (ref 0.60–0.93)
GLUCOSE: 93 mg/dL (ref 65–99)
POTASSIUM: 4.2 mmol/L (ref 3.5–5.3)
Sodium: 140 mmol/L (ref 135–146)

## 2016-05-01 NOTE — Patient Instructions (Signed)

## 2016-05-01 NOTE — Progress Notes (Signed)
Cardiology Office Note    Date:  05/01/2016   ID:  Erin Alvarez, DOB [birth year], MRN [id number]  PCP:  [name], MD  Cardiologist:  [name], MD   Chief Complaint  Patient presents with  . Sleep Apnea  . Atrial Fibrillation  . Hypertension    History of Present Illness:  Erin Alvarez is a 70 y.o. female with a history of atypical CP with a stress echo in the past with no ischemia. She has a history of rheumatic fever as a child. She has grade 2 diastolic dysfunction by echo. She was diagnosed with PAF in 2013 and has been on Xarelto for a CHADS2Vasc score of 3. She has severe OSA with an initial AHI of 29/hr on her sleep study and is on CPAP.  She is doing well today. She denies any chest pain, SOB, DOE, dizziness, LE edema, palpitations or syncope. .  She tolerates her CPAP well. She tolerates the nasal pillow mask and feels the pressure is adequate. She feels refreshed when she gets up in the am if she has slept well. She gets sleepy in the afternoon and sometimes takes a nap if sitting in a chair for a while.    Past Medical History:  Diagnosis Date  . Allergic rhinitis, cause unspecified   . Disorder of bone and cartilage, unspecified   . Diverticulosis of colon (without mention of hemorrhage)   . Elbow fracture, right 12/12  . Essential hypertension, benign   . Family history of malignant neoplasm of breast   . H/O: rheumatic fever   . Heart murmur    dx as child - s/p rheumatic fever  . Leukocytopenia, unspecified   . Lung nodule 06/2009   CT of chest, 4 mm subpleural nodule in the superior segment of the right lower lobe  . OSA (obstructive sleep apnea)    Severe w AHI 29/hr and successful CPAP now on auto CPAP  . Osteopenia   . Other specified disorder of skin   . Persistent atrial fibrillation ([place]) 05/04/2012   on systemic anticoagulation w Xarelto for CHADS VASC score of 3  . Persistent atrial fibrillation ([place]) 05/04/2012  . Pneumonia,  organism unspecified(486)   . Pulmonary HTN    resolved on echo 10/2015  . Unspecified sinusitis (chronic)     Past Surgical History:  Procedure Laterality Date  . BREAST BIOPSY Left    biopsy - benign  . DILATION AND CURETTAGE OF UTERUS    . FOOT SURGERY Left   . TONSILLECTOMY    . TUBAL LIGATION      Current Medications: Outpatient Medications Prior to Visit  Medication Sig Dispense Refill  . cetirizine (ZYRTEC) 10 MG tablet Take 10 mg by mouth daily as needed. For allergies    . diltiazem (CARDIZEM CD) 120 MG 24 hr capsule Take 1 capsule (120 mg total) by mouth daily. 90 capsule 2  . estradiol (ESTRACE) 0.1 MG/GM vaginal cream Place 1 Applicatorful vaginally once a week.     . flecainide (TAMBOCOR) 50 MG tablet TAKE 1 TABLET BY MOUTH TWICE DAILY 180 tablet 2  . furosemide (LASIX) 20 MG tablet TAKE 1 TABLET BY MOUTH DAILY AS NEEDED FOR EDEMA 90 tablet 0  . hydrocortisone 2.5 % cream Apply 1 application topically 2 (two) times daily. Apply to lower lip BID  2  . losartan-hydrochlorothiazide (HYZAAR) 50-12.5 MG tablet TAKE 1 TABLET BY MOUTH DAILY 90 tablet 1  . Magnesium 400 MG CAPS Take  400 mg by mouth daily.    . mometasone (NASONEX) 50 MCG/ACT nasal spray Place 2 sprays into the nose daily as needed (allergies).     . Multiple Vitamins-Minerals (CENTRUM PO) Take 1 tablet by mouth daily.      . NON FORMULARY CPAP at bedtime    . Omega-3 Fatty Acids (FISH OIL) 1200 MG CAPS Take 2 capsules by mouth daily.      [name] 20 MG TABS tablet TAKE 1 TABLET BY MOUTH DAILY WITH DINNER 30 tablet 8  . furosemide (LASIX) 20 MG tablet TAKE 1 TABLET BY MOUTH DAILY AS NEEDED FOR EDEMA (Patient not taking: Reported on 05/01/2016) 30 tablet 0  . metroNIDAZOLE (METROGEL) 0.75 % gel Apply 1 application topically 2 (two) times daily.  2   No facility-administered medications prior to visit.      Allergies:   Ciprofloxacin and Naproxen   Social History   Social History  . Marital status:  Married    Spouse name: Richard  . Number of children: 2  . Years of education: N/A   Occupational History  . Retired Theme park manager Unemployed   Social History Main Topics  . Smoking status: Former Smoker    Packs/day: 2.00    Years: 15.00    Quit date: 06/22/1974  . Smokeless tobacco: Never Used  . Alcohol use 0.0 oz/week     Comment: rare wine  . Drug use: No  . Sexual activity: Not Asked   Other Topics Concern  . None   Social History Narrative   Married   2 children   Quit tobacco 1976 - smoked 2 ppd x 15   Retired - Emergency planning/management officer   Alcohol use: yes   Son coming in overseas - living in [place]     Family History:  The patient's family history includes Brain cancer in her mother; Emphysema in her father; Heart disease (age of onset: 10) in her mother; Hyperlipidemia in her brother and other; Hypertension in her brother, father, and other; Lung cancer in her mother.   ROS:   Please see the history of present illness.    ROS All other systems reviewed and are negative.  No flowsheet data found.     PHYSICAL EXAM:   VS:  BP 108/62   Pulse 60   Ht 5\' 4"  (1.626 m)   Wt 181 lb 6.4 oz (82.3 kg)   SpO2 96%   BMI 31.14 kg/m    GEN: Well nourished, well developed, in no acute distress  HEENT: normal  Neck: no JVD, carotid bruits, or masses Cardiac: RRR; no murmurs, rubs, or gallops,no edema.  Intact distal pulses bilaterally.  Respiratory:  clear to auscultation bilaterally, normal work of breathing GI: soft, nontender, nondistended, + BS MS: no deformity or atrophy  Skin: warm and dry, no rash Neuro:  Alert and Oriented x 3, Strength and sensation are intact Psych: euthymic mood, full affect  Wt Readings from Last 3 Encounters:  05/01/16 181 lb 6.4 oz (82.3 kg)  11/11/15 181 lb (82.1 kg)  11/07/15 181 lb 2 oz (82.2 kg)      Studies/Labs Reviewed:   EKG:  EKG is not ordered today.    Recent Labs: No results found for requested labs within last 8760 hours.     Lipid Panel    Component Value Date/Time   CHOL 169 07/03/2010 2029   TRIG 52 07/03/2010 2029   HDL 72 07/03/2010 2029   CHOLHDL 2.3 Ratio 07/03/2010 2029  VLDL 10 07/03/2010 2029   [place] 87 07/03/2010 2029    Additional studies/ records that were reviewed today include:  none    ASSESSMENT:    1. Persistent atrial fibrillation (HCC)   2. Pulmonary HTN   3. HYPERTENSION, BENIGN ESSENTIAL   4. OSA (obstructive sleep apnea)      PLAN:  In order of problems listed above:  1. Persistent atrial fibrillation maintaining NSR - continue CCB, flecainide, xarelto 2. Pulmonary HTN - resolved on echo 10/2015 3. HTN - BP controlled on current meds.  Continue ARB, diuretic, CCB.   OSA - the patient is tolerating PAP therapy well without any problems.  The patient has been using and benefiting from CPAP use and will continue to benefit from therapy. I will get a download from her DME.     Medication Adjustments/Labs and Tests Ordered: Current medicines are reviewed at length with the patient today.  Concerns regarding medicines are outlined above.  Medication changes, Labs and Tests ordered today are listed in the Patient Instructions below.  There are no Patient Instructions on file for this visit.   Signed, [name], MD  05/01/2016 3:11 PM    [facility] [place], [place], [place]  [address] Phone: [phone]; Fax: [phone]

## 2016-05-05 ENCOUNTER — Telehealth: Payer: Self-pay | Admitting: Cardiology

## 2016-05-05 NOTE — Telephone Encounter (Signed)
Informed patient of results and verbal understanding expressed.  

## 2016-05-05 NOTE — Telephone Encounter (Signed)
-----   Message from Sueanne Margarita, MD sent at 05/03/2016  3:24 PM EST ----- Please let patient know that labs were normal.  Continue current medical therapy.

## 2016-05-05 NOTE — Telephone Encounter (Signed)
Follow up  ° ° °Pt returning call  °

## 2016-05-06 DIAGNOSIS — Z6831 Body mass index (BMI) 31.0-31.9, adult: Secondary | ICD-10-CM | POA: Diagnosis not present

## 2016-05-06 DIAGNOSIS — N390 Urinary tract infection, site not specified: Secondary | ICD-10-CM | POA: Diagnosis not present

## 2016-05-06 DIAGNOSIS — Z01419 Encounter for gynecological examination (general) (routine) without abnormal findings: Secondary | ICD-10-CM | POA: Diagnosis not present

## 2016-05-10 ENCOUNTER — Encounter: Payer: Self-pay | Admitting: Cardiology

## 2016-05-22 DIAGNOSIS — R69 Illness, unspecified: Secondary | ICD-10-CM | POA: Diagnosis not present

## 2016-05-26 DIAGNOSIS — G4733 Obstructive sleep apnea (adult) (pediatric): Secondary | ICD-10-CM | POA: Diagnosis not present

## 2016-06-26 ENCOUNTER — Telehealth: Payer: Self-pay | Admitting: Cardiology

## 2016-06-26 MED ORDER — DILTIAZEM HCL ER COATED BEADS 120 MG PO CP24: 120.0000 mg | ORAL_CAPSULE | Freq: Every day | ORAL | 3 refills | Status: DC

## 2016-06-26 MED ORDER — FUROSEMIDE 20 MG PO TABS: 20.0000 mg | ORAL_TABLET | Freq: Every day | ORAL | 1 refills | Status: DC | PRN

## 2016-06-26 MED ORDER — RIVAROXABAN 20 MG PO TABS: ORAL_TABLET | ORAL | 1 refills | Status: DC

## 2016-06-26 MED ORDER — LOSARTAN POTASSIUM-HCTZ 50-12.5 MG PO TABS: 1.0000 | ORAL_TABLET | Freq: Every day | ORAL | 3 refills | Status: DC

## 2016-06-26 MED ORDER — FLECAINIDE ACETATE 50 MG PO TABS: 50.0000 mg | ORAL_TABLET | Freq: Two times a day (BID) | ORAL | 3 refills | Status: DC

## 2016-06-26 NOTE — Telephone Encounter (Signed)
F/u Message  Pt call requesting to speak with RN stating the pharmacy is [facility] # [phone] fax [phone]

## 2016-06-26 NOTE — Telephone Encounter (Signed)
New Message  Pt call requesting to speak with RN. Pt states her insurance policy has changed and will be getting medication thru the mail. Pt does not know the name of the new pharmacy, but states they are going to be reaching out to Dr. [name] or RN. Pt wanted to inform because she will be running out of her medication soon. Please call back to discuss

## 2016-06-26 NOTE — Telephone Encounter (Signed)
Patient requests prescriptions sent to [facility]. Refills sent. To [facility] for Xarelto refills.

## 2016-06-26 NOTE — Telephone Encounter (Signed)
Called patient to confirm new mail order pharmacy.  She will contact her insurance and call back with the pharmacy name and phone number. She agrees with treatment plan.

## 2016-07-01 ENCOUNTER — Telehealth: Payer: Self-pay | Admitting: Cardiology

## 2016-07-01 NOTE — Telephone Encounter (Signed)
Patient called to report that at midnight she broke into afib for one hour. She states she spoke to the on-call provider and was instructed to take her Cardizem CD 120 mg. Her afib broke shortly after. During the episode, she states her BP was 159/70 and her HR was 120.  She states her "heart was flying." She is currently asymptomatic and she is still in a normal rhythm. She would like recommendations from Dr. [name] as far as medications or "pill in the pocket" to take when/if she breaks into afib again.  To Dr. [name].

## 2016-07-01 NOTE — Telephone Encounter (Signed)
Follow Up:   Please look at previous encounter from today. Computer acting up,closed encounter before you could read it.

## 2016-07-01 NOTE — Telephone Encounter (Signed)
New Message  Pt voiced she is calling back due to no one has called her since this morning.  Pt voiced she would like to resend another message and hopefully a nurse will contact her back.  Please f/u

## 2016-07-01 NOTE — Telephone Encounter (Signed)
See other 1/10 phone note.

## 2016-07-01 NOTE — Telephone Encounter (Signed)
New Message:   Pt had an episode of Atrial Fib last night,wants to know if she should come in? Also wants to know if Dr [name] had ordered all of her medicine?She is not in Atrial Fib now.

## 2016-07-02 NOTE — Telephone Encounter (Signed)
Left message to call back  

## 2016-07-02 NOTE — Telephone Encounter (Signed)
Have her increase Flecainide to 100mg  BID and get an ETT in 2 weeks.

## 2016-07-03 ENCOUNTER — Other Ambulatory Visit: Payer: Self-pay | Admitting: *Deleted

## 2016-07-03 ENCOUNTER — Telehealth: Payer: Self-pay | Admitting: Cardiology

## 2016-07-03 DIAGNOSIS — I4819 Other persistent atrial fibrillation: Secondary | ICD-10-CM

## 2016-07-03 MED ORDER — FLECAINIDE ACETATE 100 MG PO TABS: 100.0000 mg | ORAL_TABLET | Freq: Two times a day (BID) | ORAL | 1 refills | Status: DC

## 2016-07-03 NOTE — Telephone Encounter (Signed)
Returned patient call.  Advised to up her flecainide to 100 twice a day.  I also gave her the patient instructions for the stress ETT.  Asked for the ETT.on 07-17-16. Sent staff msg to [facility] for ETT scheduling.

## 2016-07-03 NOTE — Telephone Encounter (Signed)
New message  New message pt verbalized that she is returning call for rn

## 2016-07-06 NOTE — Telephone Encounter (Signed)
Medication list has been updated to reflect flecainide 100 mg BID and GXT is scheduled on 07/17/16.  See separate phone note dated 07/03/16

## 2016-07-07 NOTE — Telephone Encounter (Signed)
GXT has been scheduled 1/26 at 0900.

## 2016-07-15 DIAGNOSIS — R69 Illness, unspecified: Secondary | ICD-10-CM | POA: Diagnosis not present

## 2016-07-17 ENCOUNTER — Ambulatory Visit (INDEPENDENT_AMBULATORY_CARE_PROVIDER_SITE_OTHER): Payer: Medicare HMO

## 2016-07-17 DIAGNOSIS — I481 Persistent atrial fibrillation: Secondary | ICD-10-CM

## 2016-07-17 DIAGNOSIS — I4819 Other persistent atrial fibrillation: Secondary | ICD-10-CM

## 2016-07-17 LAB — EXERCISE TOLERANCE TEST
CHL CUP MPHR: 148 {beats}/min
CHL CUP RESTING HR STRESS: 54 {beats}/min
CSEPEW: 6.4 METS
Exercise duration (min): 4 min
Exercise duration (sec): 32 s
Peak HR: 114 {beats}/min
Percent HR: 77 %
RPE: 15

## 2016-07-31 DIAGNOSIS — G4733 Obstructive sleep apnea (adult) (pediatric): Secondary | ICD-10-CM | POA: Diagnosis not present

## 2016-08-03 ENCOUNTER — Telehealth: Payer: Self-pay | Admitting: Cardiology

## 2016-08-03 MED ORDER — FLECAINIDE ACETATE 100 MG PO TABS: 100.0000 mg | ORAL_TABLET | Freq: Two times a day (BID) | ORAL | 3 refills | Status: DC

## 2016-08-03 NOTE — Telephone Encounter (Signed)
New message       *STAT* If patient is at the pharmacy, call can be transferred to refill team.   1. Which medications need to be refilled? (please list name of each medication and dose if known)  Flecainide 100mg  bid 2. Which pharmacy/location (including street and city if local pharmacy) is medication to be sent to? [facility] delivery 3. Do they need a 30 day or 90 day supply?  90 day Pt was taking medication once a day but now is taking it twice a day and will run out soon.  Please call pt and let her know it has been called in

## 2016-08-03 NOTE — Telephone Encounter (Signed)
Pt's Rx was sent to pt's pharmacy as requested. Confirmation received.  °

## 2016-08-25 DIAGNOSIS — H01001 Unspecified blepharitis right upper eyelid: Secondary | ICD-10-CM | POA: Diagnosis not present

## 2016-08-25 DIAGNOSIS — H43813 Vitreous degeneration, bilateral: Secondary | ICD-10-CM | POA: Diagnosis not present

## 2016-08-25 DIAGNOSIS — H524 Presbyopia: Secondary | ICD-10-CM | POA: Diagnosis not present

## 2016-08-25 DIAGNOSIS — H2513 Age-related nuclear cataract, bilateral: Secondary | ICD-10-CM | POA: Diagnosis not present

## 2016-08-26 DIAGNOSIS — L249 Irritant contact dermatitis, unspecified cause: Secondary | ICD-10-CM | POA: Diagnosis not present

## 2016-09-23 DIAGNOSIS — L720 Epidermal cyst: Secondary | ICD-10-CM | POA: Diagnosis not present

## 2016-10-26 DIAGNOSIS — I10 Essential (primary) hypertension: Secondary | ICD-10-CM | POA: Diagnosis not present

## 2016-10-26 NOTE — Progress Notes (Signed)
Cardiology Office Note    Date:  10/27/2016   ID:  DANYELL SHADER, DOB [birth year], MRN [id number]  PCP:  [name], MD  Cardiologist:  [name], MD   Chief Complaint  Patient presents with  . Atrial Fibrillation  . Sleep Apnea    History of Present Illness:  Erin Alvarez is a 70 y.o. female with a history of atypical CP and subsequent stress echo with no ischemia, rheumatic fever as a child,  grade 2 diastolic dysfunction by echo and persistent atrial fibrillation (first diagnosed in 2013) and has been on Xarelto for a CHADS2Vasc score of 3. She has severe OSA with an initial AHI of 29/hr on her sleep study and is on CPAP.  She is here for follouwp and is doing well. She denies any chest pain, SOB, DOE, dizziness, PND, orthopnea,  palpitations or syncope.Occasionally she will have some LE edema.  She tolerates her CPAP well. She tolerates the nasal pillow mask and feels the pressure is adequate. She feels refreshed when she gets up in the am if she has slept well. She dose occasionally get sleepy in the afternoon and sometimes takes a nap if sitting in a chair for a while.  She denies any significant mouth or nasal dryness.    Past Medical History:  Diagnosis Date  . Allergic rhinitis, cause unspecified   . Disorder of bone and cartilage, unspecified   . Diverticulosis of colon (without mention of hemorrhage)   . Elbow fracture, right 12/12  . Essential hypertension, benign   . Family history of malignant neoplasm of breast   . H/O: rheumatic fever   . Heart murmur    dx as child - s/p rheumatic fever  . Leukocytopenia, unspecified   . Lung nodule 06/2009   CT of chest, 4 mm subpleural nodule in the superior segment of the right lower lobe  . OSA (obstructive sleep apnea)    Severe w AHI 29/hr and successful CPAP now on auto CPAP  . Osteopenia   . Other specified disorder of skin   . Persistent atrial fibrillation ([place]) 05/04/2012   on systemic anticoagulation w  Xarelto for CHADS VASC score of 3  . Persistent atrial fibrillation ([place]) 05/04/2012  . Pneumonia, organism unspecified(486)   . Pulmonary HTN ([place])    resolved on echo 10/2015  . Unspecified sinusitis (chronic)     Past Surgical History:  Procedure Laterality Date  . BREAST BIOPSY Left    biopsy - benign  . DILATION AND CURETTAGE OF UTERUS    . FOOT SURGERY Left   . TONSILLECTOMY    . TUBAL LIGATION      Current Medications: Current Meds  Medication Sig  . cetirizine (ZYRTEC) 10 MG tablet Take 10 mg by mouth daily as needed. For allergies  . diltiazem (CARDIZEM CD) 120 MG 24 hr capsule Take 1 capsule (120 mg total) by mouth daily.  Marland Kitchen estradiol (ESTRACE) 0.1 MG/GM vaginal cream Place 1 Applicatorful vaginally once a week.   . flecainide (TAMBOCOR) 100 MG tablet Take 1 tablet (100 mg total) by mouth 2 (two) times daily.  . furosemide (LASIX) 20 MG tablet Take 1 tablet (20 mg total) by mouth daily as needed for edema.  . hydrocortisone 2.5 % cream Apply 1 application topically 2 (two) times daily. Apply to lower lip BID  . losartan-hydrochlorothiazide (HYZAAR) 50-12.5 MG tablet Take 1 tablet by mouth daily.  . Magnesium 400 MG CAPS Take 400 mg by mouth  daily.  . mometasone (NASONEX) 50 MCG/ACT nasal spray Place 2 sprays into the nose daily as needed (allergies).   . Multiple Vitamins-Minerals (CENTRUM PO) Take 1 tablet by mouth daily.    . NON FORMULARY CPAP at bedtime  . Omega-3 Fatty Acids (FISH OIL) 1200 MG CAPS Take 2 capsules by mouth daily.    . rivaroxaban (XARELTO) 20 MG TABS tablet TAKE 1 TABLET BY MOUTH DAILY WITH DINNER    Allergies:   Ciprofloxacin and Naproxen   Social History   Social History  . Marital status: Married    Spouse name: Richard  . Number of children: 2  . Years of education: N/A   Occupational History  . Retired Theme park manager Unemployed   Social History Main Topics  . Smoking status: Former Smoker    Packs/day: 2.00    Years: 15.00    Quit  date: 06/22/1974  . Smokeless tobacco: Never Used  . Alcohol use 0.0 oz/week     Comment: rare wine  . Drug use: No  . Sexual activity: Not Asked   Other Topics Concern  . None   Social History Narrative   Married   2 children   Quit tobacco 1976 - smoked 2 ppd x 15   Retired - Emergency planning/management officer   Alcohol use: yes   Son coming in overseas - living in [place]     Family History:  The patient's family history includes Brain cancer in her mother; Emphysema in her father; Heart disease (age of onset: 62) in her mother; Hyperlipidemia in her brother and other; Hypertension in her brother, father, and other; Lung cancer in her mother.   ROS:   Please see the history of present illness.    ROS All other systems reviewed and are negative.  No flowsheet data found.     PHYSICAL EXAM:   VS:  BP 122/72   Pulse 60   Ht 5\' 4"  (1.626 m)   Wt 186 lb (84.4 kg)   BMI 31.93 kg/m    GEN: Well nourished, well developed, in no acute distress  HEENT: normal  Neck: no JVD, carotid bruits, or masses Cardiac: RRR; no murmurs, rubs, or gallops,no edema.  Intact distal pulses bilaterally.  Respiratory:  clear to auscultation bilaterally, normal work of breathing GI: soft, nontender, nondistended, + BS MS: no deformity or atrophy  Skin: warm and dry, no rash Neuro:  Alert and Oriented x 3, Strength and sensation are intact Psych: euthymic mood, full affect  Wt Readings from Last 3 Encounters:  10/27/16 186 lb (84.4 kg)  05/01/16 181 lb 6.4 oz (82.3 kg)  11/11/15 181 lb (82.1 kg)      Studies/Labs Reviewed:   EKG:  EKG is not ordered today.   Recent Labs: 05/01/2016: BUN 14; Creat 0.74; Hemoglobin 14.2; Platelets 238; Potassium 4.2; Sodium 140   Lipid Panel    Component Value Date/Time   CHOL 169 07/03/2010 2029   TRIG 52 07/03/2010 2029   HDL 72 07/03/2010 2029   CHOLHDL 2.3 Ratio 07/03/2010 2029   VLDL 10 07/03/2010 2029   LDLCALC 87 07/03/2010 2029    Additional studies/  records that were reviewed today include:  none    ASSESSMENT:    1. Persistent atrial fibrillation ([place])   2. HYPERTENSION, BENIGN ESSENTIAL   3. Pulmonary HTN ([place])   4. OSA (obstructive sleep apnea)      PLAN:  In order of problems listed above:  1.  Persistent atrial fibrillation -  she is maintaining NSR.  She will continue on Cardizem CD 120mg  daioly, flecainide 100mg  BID and Xarelto for a CHADS2VASC score of 3.  Check NOAC panel from PCP 2.  HTN - BP is adequately controlled on exam today. She will contiue on ARB, diuretic and CCB.   3.  Pulmonary HTN - resolved by echo a year ago with PASP 62mmHg. 4.  OSA - the patient is tolerating PAP therapy well without any problems. The PAP download was reviewed today and showed an AHI of 2.9/hr on 8 cm H2O with 100% compliance in using more than 4 hours nightly.  The patient has been using and benefiting from CPAP use and will continue to benefit from therapy.      Medication Adjustments/Labs and Tests Ordered: Current medicines are reviewed at length with the patient today.  Concerns regarding medicines are outlined above.  Medication changes, Labs and Tests ordered today are listed in the Patient Instructions below.  There are no Patient Instructions on file for this visit.   Signed, [name], MD  10/27/2016 8:01 AM    [facility] [address], [place], [place]  [address] Phone: [phone]; Fax: [phone]

## 2016-10-27 ENCOUNTER — Ambulatory Visit (INDEPENDENT_AMBULATORY_CARE_PROVIDER_SITE_OTHER): Payer: Medicare HMO | Admitting: Cardiology

## 2016-10-27 ENCOUNTER — Encounter: Payer: Self-pay | Admitting: Cardiology

## 2016-10-27 VITALS — BP 122/72 | HR 60 | Ht 64.0 in | Wt 186.0 lb

## 2016-10-27 DIAGNOSIS — I272 Pulmonary hypertension, unspecified: Secondary | ICD-10-CM | POA: Diagnosis not present

## 2016-10-27 DIAGNOSIS — G4733 Obstructive sleep apnea (adult) (pediatric): Secondary | ICD-10-CM

## 2016-10-27 DIAGNOSIS — I10 Essential (primary) hypertension: Secondary | ICD-10-CM

## 2016-10-27 DIAGNOSIS — I481 Persistent atrial fibrillation: Secondary | ICD-10-CM

## 2016-10-27 DIAGNOSIS — I4819 Other persistent atrial fibrillation: Secondary | ICD-10-CM

## 2016-10-27 NOTE — Patient Instructions (Signed)

## 2016-11-02 DIAGNOSIS — M859 Disorder of bone density and structure, unspecified: Secondary | ICD-10-CM | POA: Diagnosis not present

## 2016-11-02 DIAGNOSIS — Z1212 Encounter for screening for malignant neoplasm of rectum: Secondary | ICD-10-CM | POA: Diagnosis not present

## 2016-11-02 DIAGNOSIS — Z1389 Encounter for screening for other disorder: Secondary | ICD-10-CM | POA: Diagnosis not present

## 2016-11-02 DIAGNOSIS — Z0000 Encounter for general adult medical examination without abnormal findings: Secondary | ICD-10-CM | POA: Diagnosis not present

## 2016-11-02 DIAGNOSIS — I10 Essential (primary) hypertension: Secondary | ICD-10-CM | POA: Diagnosis not present

## 2016-11-02 DIAGNOSIS — M5489 Other dorsalgia: Secondary | ICD-10-CM | POA: Diagnosis not present

## 2016-11-02 DIAGNOSIS — I4891 Unspecified atrial fibrillation: Secondary | ICD-10-CM | POA: Diagnosis not present

## 2016-11-02 DIAGNOSIS — Z1231 Encounter for screening mammogram for malignant neoplasm of breast: Secondary | ICD-10-CM | POA: Diagnosis not present

## 2016-11-02 DIAGNOSIS — G4733 Obstructive sleep apnea (adult) (pediatric): Secondary | ICD-10-CM | POA: Diagnosis not present

## 2016-11-02 DIAGNOSIS — Z803 Family history of malignant neoplasm of breast: Secondary | ICD-10-CM | POA: Diagnosis not present

## 2016-11-02 DIAGNOSIS — Z6830 Body mass index (BMI) 30.0-30.9, adult: Secondary | ICD-10-CM | POA: Diagnosis not present

## 2016-11-12 ENCOUNTER — Ambulatory Visit (INDEPENDENT_AMBULATORY_CARE_PROVIDER_SITE_OTHER): Payer: Medicare HMO | Admitting: Nurse Practitioner

## 2016-11-12 ENCOUNTER — Telehealth: Payer: Self-pay

## 2016-11-12 ENCOUNTER — Encounter: Payer: Self-pay | Admitting: Nurse Practitioner

## 2016-11-12 VITALS — BP 112/72 | HR 70 | Ht 65.0 in | Wt 184.0 lb

## 2016-11-12 DIAGNOSIS — Z1211 Encounter for screening for malignant neoplasm of colon: Secondary | ICD-10-CM | POA: Diagnosis not present

## 2016-11-12 DIAGNOSIS — Z7901 Long term (current) use of anticoagulants: Secondary | ICD-10-CM

## 2016-11-12 MED ORDER — NA SULFATE-K SULFATE-MG SULF 17.5-3.13-1.6 GM/177ML PO SOLN: 1.0000 | Freq: Once | ORAL | 0 refills | Status: AC

## 2016-11-12 NOTE — Patient Instructions (Signed)
If you are age 73 or older, your body mass index should be between 23-30. Your Body mass index is 30.62 kg/m. If this is out of the aforementioned range listed, please consider follow up with your Primary Care Provider.  If you are age 53 or younger, your body mass index should be between 19-25. Your Body mass index is 30.62 kg/m. If this is out of the aformentioned range listed, please consider follow up with your Primary Care Provider.   You have been scheduled for a colonoscopy. Please follow written instructions given to you at your visit today.  Please pick up your prep supplies at the pharmacy within the next 1-3 days. If you use inhalers (even only as needed), please bring them with you on the day of your procedure. Your physician has requested that you go to www.startemmi.com and enter the access code given to you at your visit today. This web site gives a general overview about your procedure. However, you should still follow specific instructions given to you by our office regarding your preparation for the procedure.  You will be contacted by our office prior to your procedure for directions on holding your Xarelto.  If you do not hear from our office 1 week prior to your scheduled procedure, please call [phone] to discuss.   Thank you for choosing me and [facility].   Tye Savoy, NP

## 2016-11-12 NOTE — Progress Notes (Signed)
HPI: Patient is a 71 year old female previously known to Dr. Olevia Perches. She had a colonoscopy in 2005 with findings of diverticulosis. Patient referred by PCP, Dr. Allie Bossier for colon cancer screening. Patient is on Xarelto for atrial fibrillation, followed by Dr. [name]. She has grade 2 diastolic dysfunction, hx of Rheumatic fever. No GI complaints. Bowel movements are normal, no blood in stool, no weight loss. No abdominal pain. She has no chest pain or palpitations. No shortness of breath.  She has hypertension, controlled on medication. Complete metabolic profile and CBC 12/21/67 were unremarkable.   Past Medical History:  Diagnosis Date  . Allergic rhinitis, cause unspecified   . Disorder of bone and cartilage, unspecified   . Diverticulosis of colon (without mention of hemorrhage)   . Elbow fracture, right 12/12  . Essential hypertension, benign   . Family history of malignant neoplasm of breast   . H/O: rheumatic fever   . Heart murmur    dx as child - s/p rheumatic fever  . Leukocytopenia, unspecified   . Lung nodule 06/2009   CT of chest, 4 mm subpleural nodule in the superior segment of the right lower lobe  . OSA (obstructive sleep apnea)    Severe w AHI 29/hr and successful CPAP now on auto CPAP  . Osteopenia   . Other specified disorder of skin   . Persistent atrial fibrillation ([place]) 05/04/2012   on systemic anticoagulation w Xarelto for CHADS VASC score of 3  . Persistent atrial fibrillation ([place]) 05/04/2012  . Pneumonia, organism unspecified(486)   . Pulmonary HTN ([place])    resolved on echo 10/2015  . Unspecified sinusitis (chronic)     Past Surgical History:  Procedure Laterality Date  . BREAST BIOPSY Left    biopsy - benign  . DILATION AND CURETTAGE OF UTERUS    . FOOT SURGERY Left   . TONSILLECTOMY    . TUBAL LIGATION     Family History  Problem Relation Age of Onset  . Lung cancer Mother        smoker  . Heart disease Mother 5       CABG  . Brain  cancer Mother   . Emphysema Father        smoker  . Hypertension Father   . Hypertension Brother   . Hyperlipidemia Brother   . Hyperlipidemia Other   . Hypertension Other    Social History  Substance Use Topics  . Smoking status: Former Smoker    Packs/day: 2.00    Years: 15.00    Quit date: 06/22/1974  . Smokeless tobacco: Never Used  . Alcohol use 0.0 oz/week     Comment: rare wine   Current Outpatient Prescriptions  Medication Sig Dispense Refill  . cetirizine (ZYRTEC) 10 MG tablet Take 10 mg by mouth daily as needed. For allergies    . diltiazem (CARDIZEM CD) 120 MG 24 hr capsule Take 1 capsule (120 mg total) by mouth daily. 90 capsule 3  . estradiol (ESTRACE) 0.1 MG/GM vaginal cream Place 1 Applicatorful vaginally once a week.     . flecainide (TAMBOCOR) 100 MG tablet Take 1 tablet (100 mg total) by mouth 2 (two) times daily. 180 tablet 3  . furosemide (LASIX) 20 MG tablet Take 1 tablet (20 mg total) by mouth daily as needed for edema. 90 tablet 1  . hydrocortisone 2.5 % cream Apply 1 application topically 2 (two) times daily. Apply to lower lip BID  2  . losartan-hydrochlorothiazide (  HYZAAR) 50-12.5 MG tablet Take 1 tablet by mouth daily. 90 tablet 3  . Magnesium 400 MG CAPS Take 400 mg by mouth daily.    . mometasone (NASONEX) 50 MCG/ACT nasal spray Place 2 sprays into the nose daily as needed (allergies).     . Multiple Vitamins-Minerals (CENTRUM PO) Take 1 tablet by mouth daily.      . NON FORMULARY CPAP at bedtime    . Omega-3 Fatty Acids (FISH OIL) 1200 MG CAPS Take 2 capsules by mouth daily.      . rivaroxaban (XARELTO) 20 MG TABS tablet TAKE 1 TABLET BY MOUTH DAILY WITH DINNER 90 tablet 1   No current facility-administered medications for this visit.    Allergies  Allergen Reactions  . Ciprofloxacin Rash  . Naproxen Rash     Review of Systems: All systems reviewed and negative except where noted in HPI.    Physical Exam: BP 112/72 (BP Location: Right Arm,  Patient Position: Sitting, Cuff Size: Normal)   Pulse 70   Ht 5\' 5"  (1.651 m)   Wt 184 lb (83.5 kg)   BMI 30.62 kg/m  Constitutional:  Well-developed female in no acute distress. Psychiatric: Normal mood and affect. Behavior is normal. EENT: . Conjunctivae are normal. No scleral icterus. Neck supple.  Cardiovascular: Normal rate, regular rhythm, soft murmur  Pulmonary/chest: Effort normal and breath sounds normal. No wheezing, rales or rhonchi. Abdominal: Soft, nondistended, nontender. Bowel sounds active throughout. There are no masses palpable. No hepatomegaly. Extremities: no edema Lymphadenopathy: No cervical adenopathy noted. Neurological: Alert and oriented to person place and time. Skin: Skin is warm and dry. No rashes noted.   ASSESSMENT AND PLAN:  22. 71 year old female here for colon cancer screening. Last colonoscopy 2005 with findings of diverticulosis Olevia Perches).  -Patient will be scheduled for a screening colonoscopy with possible polypectomy.  The risks and benefits of the procedure were discussed and the patient agrees to proceed. Xarelto will need to be held prior to procedure, see #2.   2. Atrial fibrillation, on Xarelto. Rate controlled.  -Hold Xarelto 2 days before procedure - will instruct when and how to resume after procedure. Patient understands there is a low but real risk of stroke Xarelto. The patient consents to proceed. Will communicate by phone or EMR with patient's prescribing provider to confirm that holding Xarelto is reasonable in this case.   Tye Savoy, NP  11/12/2016, 2:42 PM  Cc: [name], MD

## 2016-11-12 NOTE — Telephone Encounter (Signed)
Ok to hold Xarelto for colonscopy

## 2016-11-12 NOTE — Telephone Encounter (Signed)
11/12/2016   RE:      Erin Alvarez DOB:   1946/06/06 MRN:   [id number]   Dear Dr. [name],    We have scheduled the above patient for an endoscopic procedure. Our records show that she is on anticoagulation therapy.   Please advise as to how long the patient may come off her therapy of Xarelto prior to the procedure, which is scheduled for 12/07/16.  Please fax back/ or route the completed form to [facility] at [phone].   Sincerely,    Thurmon Fair RMA

## 2016-11-13 NOTE — Telephone Encounter (Signed)
yes

## 2016-11-13 NOTE — Telephone Encounter (Signed)
Dr. [name],  Is two day hold appropriate?  Thanks Peter [place], [place]

## 2016-11-13 NOTE — Telephone Encounter (Signed)
Per Dr. [name] okay to hold Xarelto 2 days prior to procedure.  Pt advised and confirmed.

## 2016-11-17 NOTE — Progress Notes (Signed)
Thank you for sending this case to me. I have reviewed the entire note, and the outlined plan seems appropriate.   Ashauna Bertholf Danis, MD  

## 2016-11-23 ENCOUNTER — Encounter: Payer: Self-pay | Admitting: Gastroenterology

## 2016-12-07 ENCOUNTER — Ambulatory Visit (AMBULATORY_SURGERY_CENTER): Payer: Medicare HMO | Admitting: Gastroenterology

## 2016-12-07 ENCOUNTER — Encounter: Payer: Self-pay | Admitting: Gastroenterology

## 2016-12-07 VITALS — BP 136/58 | HR 46 | Temp 97.5°F | Resp 11 | Ht 65.0 in | Wt 184.0 lb

## 2016-12-07 DIAGNOSIS — Z1212 Encounter for screening for malignant neoplasm of rectum: Secondary | ICD-10-CM

## 2016-12-07 DIAGNOSIS — Z1211 Encounter for screening for malignant neoplasm of colon: Secondary | ICD-10-CM

## 2016-12-07 DIAGNOSIS — D123 Benign neoplasm of transverse colon: Secondary | ICD-10-CM

## 2016-12-07 MED ORDER — SODIUM CHLORIDE 0.9 % IV SOLN: 500.0000 mL | INTRAVENOUS | Status: DC

## 2016-12-07 NOTE — Progress Notes (Signed)
Pt's states no medical or surgical changes since previsit or office visit. 

## 2016-12-07 NOTE — Progress Notes (Signed)
Report given to PACU, vss 

## 2016-12-07 NOTE — Op Note (Signed)
[place] Patient Name: Erin Alvarez Procedure Date: 12/07/2016 1:18 PM MRN: [id number] Endoscopist: Mallie Mussel L. Loletha Carrow , MD Age: 71 Referring MD:  Date of Birth: 1945-12-22 Gender: Female Account #: [id number] Procedure:                Colonoscopy Indications:              Screening for colorectal malignant neoplasm (no                            polyps on 2005 colonoscopy) Medicines:                Monitored Anesthesia Care Procedure:                Pre-Anesthesia Assessment:                           - Prior to the procedure, a History and Physical                            was performed, and patient medications and                            allergies were reviewed. The patient's tolerance of                            previous anesthesia was also reviewed. The risks                            and benefits of the procedure and the sedation                            options and risks were discussed with the patient.                            All questions were answered, and informed consent                            was obtained. Prior Anticoagulants: The patient has                            taken Xarelto (rivaroxaban), last dose was 2 days                            prior to procedure. ASA Grade Assessment: III - A                            patient with severe systemic disease. After                            reviewing the risks and benefits, the patient was                            deemed in satisfactory condition to undergo the  procedure.                           After obtaining informed consent, the colonoscope                            was passed under direct vision. Throughout the                            procedure, the patient's blood pressure, pulse, and                            oxygen saturations were monitored continuously. The                            Model PCF-[id number]) scope was introduced            through the anus and advanced to the the cecum,                            identified by appendiceal orifice and ileocecal                            valve. The colonoscopy was performed without                            difficulty. The patient tolerated the procedure                            well. The quality of the bowel preparation was                            excellent. The ileocecal valve, appendiceal                            orifice, and rectum were photographed. The quality                            of the bowel preparation was evaluated using the                            BBPS [facility] Bowel Preparation Scale) with scores                            of: Right Colon = 2, Transverse Colon = 3 and Left                            Colon = 3. The total BBPS score equals 8. The bowel                            preparation used was SUPREP. Scope In: 1:29:20 PM Scope Out: 1:43:10 PM Scope Withdrawal Time: 0 hours 9 minutes 38 seconds  Total Procedure Duration: 0 hours 13 minutes 50 seconds  Findings:  The perianal and digital rectal examinations were                            normal.                           A 2 mm polyp was found in the hepatic flexure. The                            polyp was sessile. The polyp was removed with a                            cold snare. Resection and retrieval were complete.                           Diverticula were found in the left colon.                           The exam was otherwise without abnormality on                            direct and retroflexion views. Complications:            No immediate complications. Estimated Blood Loss:     Estimated blood loss: none. Impression:               - One 2 mm polyp at the hepatic flexure, removed                            with a cold snare. Resected and retrieved.                           - Diverticulosis in the left colon.                           - The examination  was otherwise normal on direct                            and retroflexion views. Recommendation:           - Patient has a contact number available for                            emergencies. The signs and symptoms of potential                            delayed complications were discussed with the                            patient. Return to normal activities tomorrow.                            Written discharge instructions were provided to the  patient.                           - Resume previous diet.                           - Continue present medications.                           - Resume Xarelto (rivaroxaban) at prior dose                            tomorrow.                           - Repeat colonoscopy is recommended for                            surveillance. The colonoscopy date will be                            determined after pathology results from today's                            exam become available for review. Henry L. Loletha Carrow, MD 12/07/2016 1:49:59 PM This report has been signed electronically.

## 2016-12-07 NOTE — Progress Notes (Signed)
No problems noted in the recovery room. maw 

## 2016-12-07 NOTE — Progress Notes (Signed)
Called to room to assist during endoscopic procedure.  Patient ID and intended procedure confirmed with present staff. Received instructions for my participation in the procedure from the performing physician.  

## 2016-12-07 NOTE — Patient Instructions (Signed)
YOU HAD AN ENDOSCOPIC PROCEDURE TODAY AT [facility]:   Refer to the procedure report that was given to you for any specific questions about what was found during the examination.  If the procedure report does not answer your questions, please call your gastroenterologist to clarify.  If you requested that your care partner not be given the details of your procedure findings, then the procedure report has been included in a sealed envelope for you to review at your convenience later.  YOU SHOULD EXPECT: Some feelings of bloating in the abdomen. Passage of more gas than usual.  Walking can help get rid of the air that was put into your GI tract during the procedure and reduce the bloating. If you had a lower endoscopy (such as a colonoscopy or flexible sigmoidoscopy) you may notice spotting of blood in your stool or on the toilet paper. If you underwent a bowel prep for your procedure, you may not have a normal bowel movement for a few days.  Please Note:  You might notice some irritation and congestion in your nose or some drainage.  This is from the oxygen used during your procedure.  There is no need for concern and it should clear up in a day or so.  SYMPTOMS TO REPORT IMMEDIATELY:   Following lower endoscopy (colonoscopy or flexible sigmoidoscopy):  Excessive amounts of blood in the stool  Significant tenderness or worsening of abdominal pains  Swelling of the abdomen that is new, acute  Fever of 100F or higher   For urgent or emergent issues, a gastroenterologist can be reached at any hour by calling [phone].   DIET:  We do recommend a small meal at first, but then you may proceed to your regular diet.  Drink plenty of fluids but you should avoid alcoholic beverages for 24 hours.  ACTIVITY:  You should plan to take it easy for the rest of today and you should NOT DRIVE or use heavy machinery until tomorrow (because of the sedation medicines used during the test).     FOLLOW UP: Our staff will call the number listed on your records the next business day following your procedure to check on you and address any questions or concerns that you may have regarding the information given to you following your procedure. If we do not reach you, we will leave a message.  However, if you are feeling well and you are not experiencing any problems, there is no need to return our call.  We will assume that you have returned to your regular daily activities without incident.  If any biopsies were taken you will be contacted by phone or by letter within the next 1-3 weeks.  Please call us at [phone] if you have not heard about the biopsies in 3 weeks.    SIGNATURES/CONFIDENTIALITY: You and/or your care partner have signed paperwork which will be entered into your electronic medical record.  These signatures attest to the fact that that the information above on your After Visit Summary has been reviewed and is understood.  Full responsibility of the confidentiality of this discharge information lies with you and/or your care-partner.    Handouts were given to your care partner on polyps and diverticulosis. Per Dr. Loletha Carrow resume your Xarelto at prior dose tomorrow. You may resume your other current medications today. Await biopsy results. Please call if any questions or concerns.

## 2016-12-08 ENCOUNTER — Telehealth: Payer: Self-pay

## 2016-12-08 NOTE — Telephone Encounter (Signed)
  Follow up Call-  Call back number 12/07/2016  Post procedure Call Back phone  # [phone]  Permission to leave phone message Yes  Some recent data might be hidden     Patient questions:  Do you have a fever, pain , or abdominal swelling? No. Pain Score  0 *  Have you tolerated food without any problems? Yes.    Have you been able to return to your normal activities? Yes.    Do you have any questions about your discharge instructions: Diet   No. Medications  No. Follow up visit  No.  Do you have questions or concerns about your Care? No.  Actions: * If pain score is 4 or above: No action needed, pain <4.  No problems per pt. maw

## 2016-12-10 ENCOUNTER — Encounter: Payer: Self-pay | Admitting: Gastroenterology

## 2017-01-15 ENCOUNTER — Telehealth: Payer: Self-pay | Admitting: *Deleted

## 2017-01-15 MED ORDER — RIVAROXABAN 20 MG PO TABS: ORAL_TABLET | ORAL | 3 refills | Status: DC

## 2017-01-15 NOTE — Telephone Encounter (Signed)
Pt calls needing Rx for mail order for Xarelto. Wt 83.5Kg, Age 71 yrs old, SCr from PCP office on 10/26/16 was 0.9 and she saw Dr [name] on 10/27/16. CrCl is 73.38 mL/min, will refill Xarelto 20mg  QD

## 2017-01-26 DIAGNOSIS — N390 Urinary tract infection, site not specified: Secondary | ICD-10-CM | POA: Diagnosis not present

## 2017-01-26 DIAGNOSIS — I482 Chronic atrial fibrillation: Secondary | ICD-10-CM | POA: Diagnosis not present

## 2017-01-26 DIAGNOSIS — I10 Essential (primary) hypertension: Secondary | ICD-10-CM | POA: Diagnosis not present

## 2017-01-26 DIAGNOSIS — R300 Dysuria: Secondary | ICD-10-CM | POA: Diagnosis not present

## 2017-02-02 DIAGNOSIS — R350 Frequency of micturition: Secondary | ICD-10-CM | POA: Diagnosis not present

## 2017-03-18 DIAGNOSIS — I809 Phlebitis and thrombophlebitis of unspecified site: Secondary | ICD-10-CM | POA: Diagnosis not present

## 2017-03-18 DIAGNOSIS — R69 Illness, unspecified: Secondary | ICD-10-CM | POA: Diagnosis not present

## 2017-03-18 DIAGNOSIS — M79662 Pain in left lower leg: Secondary | ICD-10-CM | POA: Diagnosis not present

## 2017-03-18 DIAGNOSIS — Z6830 Body mass index (BMI) 30.0-30.9, adult: Secondary | ICD-10-CM | POA: Diagnosis not present

## 2017-04-06 DIAGNOSIS — D171 Benign lipomatous neoplasm of skin and subcutaneous tissue of trunk: Secondary | ICD-10-CM | POA: Diagnosis not present

## 2017-04-07 ENCOUNTER — Telehealth: Payer: Self-pay

## 2017-04-07 NOTE — Telephone Encounter (Signed)
Pre-operative protocol: I s/w Wendy @ [facility] Dr. Ethlyn Gallery office and informed pt is cleared for surgery. Advised of recommendations to hold Eliquis 2-3 days prior to surgery as needed. Advised recommendation has been sent to Dr. Marlou Starks through [facility] per Ellen Henri, [place].

## 2017-04-07 NOTE — Telephone Encounter (Signed)
Pt takes Xarelto for afib with CHADS2VASc score of 3 (age, sex, HTN). Renal function is normal. Ok to hold Xarelto for 2-3 days prior to procedure as needed.

## 2017-04-07 NOTE — Telephone Encounter (Signed)
   [facility] Pre-operative Risk Assessment    Request for surgical clearance:  1. What type of surgery is being performed? Lipoma excision of chest wall.  2. When is this surgery scheduled? The patient does not have a surgery scheduled yet because she will require written clearance in order to get a surgery date. Please advise if the patient is cleared for surgery and any medication holds.  3. Are there any medications that need to be held prior to surgery and how long? Please advise if the patient is cleared for surgery and any medication holds. Please call to advise if this patient will require an office visit or further medical work-up before clearance can be given. Please call [phone] and leave a message with the triage nurse.  4. Practice name and name of physician performing surgery?  [facility]. Dr. Marlou Starks.   5. What is your office phone and fax number? P: [phone]. F: [phone].  6. Anesthesia type (None, local, MAC, general) ?  General.  Thinks procedure can be done under local anesthesia with some sedation.   Stephannie Peters 04/07/2017, 1:56 PM  _________________________________________________________________   (provider comments below)

## 2017-04-07 NOTE — Telephone Encounter (Signed)
   Chart reviewed as part of pre-operative protocol coverage.   Last office visit with Dr. [name] was < 6 months ago and pt doing well w/o cardiac symptoms at that time. She has a h/o PAF and is on Eliquis for stroke prophylaxis. No h/o CAD nor MI. Recent ETT 06/2016 was negative. Echo 10/2015 with normal LVEF.   Phone call was made to pt today. Pt doing well w/o cardiac symptoms. No exertional CP or dyspnea. Functional level was assessed using the Duke Activity Status Index. Pt is able to perform >4-10 METS w/o exertional CP or dyspnea.   According to the Revised Cardiac Risk Index (RCRI), her Perioperative Risk of Major Cardiac Events is 0.4% High Risk Surgery = No H/o IHD = No H/o  CHF = No H/o CVA = No DM on Insulin = No SCr > 2 = No  Based on ACC/AHA guidelines, DEJANAE HELSER would be at acceptable risk for the planned procedure without further cardiovascular testing. Ok to proceed with surgery. Pharmacy has also addressed anticoagulation. Ok to hold Eliquis 2-3 days prior to surgery as needed. Pt has been notified. I will route note to Dr. Marlou Starks, [facility].   Pre-op covering RN to also call. Per request: Please call [phone] and leave a message with the triage nurse.  Lyda Jester, PA-C 04/07/2017, 4:01 PM

## 2017-04-19 DIAGNOSIS — G4733 Obstructive sleep apnea (adult) (pediatric): Secondary | ICD-10-CM | POA: Diagnosis not present

## 2017-05-17 ENCOUNTER — Other Ambulatory Visit: Payer: Self-pay | Admitting: General Surgery

## 2017-05-17 DIAGNOSIS — D171 Benign lipomatous neoplasm of skin and subcutaneous tissue of trunk: Secondary | ICD-10-CM | POA: Diagnosis not present

## 2017-05-25 DIAGNOSIS — G4733 Obstructive sleep apnea (adult) (pediatric): Secondary | ICD-10-CM | POA: Diagnosis not present

## 2017-06-04 ENCOUNTER — Telehealth: Payer: Self-pay | Admitting: Cardiology

## 2017-06-04 ENCOUNTER — Other Ambulatory Visit: Payer: Self-pay | Admitting: *Deleted

## 2017-06-04 MED ORDER — DILTIAZEM HCL ER COATED BEADS 120 MG PO CP24: 120.0000 mg | ORAL_CAPSULE | Freq: Every day | ORAL | 0 refills | Status: DC

## 2017-06-04 MED ORDER — FLECAINIDE ACETATE 100 MG PO TABS: 100.0000 mg | ORAL_TABLET | Freq: Two times a day (BID) | ORAL | 0 refills | Status: DC

## 2017-06-04 MED ORDER — LOSARTAN POTASSIUM-HCTZ 50-12.5 MG PO TABS: 1.0000 | ORAL_TABLET | Freq: Every day | ORAL | 0 refills | Status: DC

## 2017-06-04 NOTE — Telephone Encounter (Signed)
°*  STAT* If patient is at the pharmacy, call can be transferred to refill team.   1. Which medications need to be refilled? (please list name of each medication and dose if known) Diltiazem 120 mg , Flecainide 100 mg ,and losartan 50/12.5 mg   2. Which pharmacy/location (including street and city if local pharmacy) is medication to be sent to?[facility]   3. Do they need a 30 day or 90 day supply?[place]

## 2017-07-15 DIAGNOSIS — M542 Cervicalgia: Secondary | ICD-10-CM | POA: Diagnosis not present

## 2017-07-15 DIAGNOSIS — M9901 Segmental and somatic dysfunction of cervical region: Secondary | ICD-10-CM | POA: Diagnosis not present

## 2017-07-22 DIAGNOSIS — M542 Cervicalgia: Secondary | ICD-10-CM | POA: Diagnosis not present

## 2017-07-22 DIAGNOSIS — M9901 Segmental and somatic dysfunction of cervical region: Secondary | ICD-10-CM | POA: Diagnosis not present

## 2017-07-28 DIAGNOSIS — L821 Other seborrheic keratosis: Secondary | ICD-10-CM | POA: Diagnosis not present

## 2017-07-28 DIAGNOSIS — R69 Illness, unspecified: Secondary | ICD-10-CM | POA: Diagnosis not present

## 2017-07-28 DIAGNOSIS — L820 Inflamed seborrheic keratosis: Secondary | ICD-10-CM | POA: Diagnosis not present

## 2017-07-28 DIAGNOSIS — L2089 Other atopic dermatitis: Secondary | ICD-10-CM | POA: Diagnosis not present

## 2017-07-29 DIAGNOSIS — M542 Cervicalgia: Secondary | ICD-10-CM | POA: Diagnosis not present

## 2017-07-29 DIAGNOSIS — M9901 Segmental and somatic dysfunction of cervical region: Secondary | ICD-10-CM | POA: Diagnosis not present

## 2017-08-05 ENCOUNTER — Encounter: Payer: Self-pay | Admitting: Cardiology

## 2017-08-13 ENCOUNTER — Ambulatory Visit: Payer: Medicare HMO | Admitting: Cardiology

## 2017-08-13 ENCOUNTER — Encounter: Payer: Self-pay | Admitting: Cardiology

## 2017-08-13 VITALS — BP 114/54 | HR 55 | Ht 65.0 in | Wt 186.1 lb

## 2017-08-13 DIAGNOSIS — I481 Persistent atrial fibrillation: Secondary | ICD-10-CM | POA: Diagnosis not present

## 2017-08-13 DIAGNOSIS — I272 Pulmonary hypertension, unspecified: Secondary | ICD-10-CM

## 2017-08-13 DIAGNOSIS — I10 Essential (primary) hypertension: Secondary | ICD-10-CM

## 2017-08-13 DIAGNOSIS — I4819 Other persistent atrial fibrillation: Secondary | ICD-10-CM

## 2017-08-13 DIAGNOSIS — G4733 Obstructive sleep apnea (adult) (pediatric): Secondary | ICD-10-CM

## 2017-08-13 NOTE — Patient Instructions (Signed)
Medication Instructions:  Your physician recommends that you continue on your current medications as directed. Please refer to the Current Medication list given to you today.  If you need a refill on your cardiac medications, please contact your pharmacy first.  Labwork: Today for kidney function test and complete blood count   Testing/Procedures: None ordered   Follow-Up: Your physician wants you to follow-up in: 6 months with PA. You will receive a reminder letter in the mail two months in advance. If you don't receive a letter, please call our office to schedule the follow-up appointment.  Your physician wants you to follow-up in: 1 year with Dr. [name]. You will receive a reminder letter in the mail two months in advance. If you don't receive a letter, please call our office to schedule the follow-up appointment.  Any Other Special Instructions Will Be Listed Below (If Applicable).   Thank you for choosing [facility], RN  [phone]  If you need a refill on your cardiac medications before your next appointment, please call your pharmacy.

## 2017-08-13 NOTE — Progress Notes (Signed)
Cardiology Office Note:    Date:  08/13/2017   ID:  Erin Alvarez, DOB 1945/12/10, MRN [id number]  PCP:  [name], MD  Cardiologist:  No primary care provider on file.    Referring MD: [name], MD   Chief Complaint  Patient presents with  . Atrial Fibrillation  . Sleep Apnea    History of Present Illness:    Erin Alvarez is a 72 y.o. female with a hx of atypical CP and subsequent stress echo with no ischemia, rheumatic fever as a child,  grade 2 diastolic dysfunction by echo and persistent atrial fibrillation (first diagnosed in 2013) and has been on Xarelto for a CHADS2Vasc score of 3. She has severe OSA with an initial AHI of 29/hr on her sleep study and is on CPAP.   She is here today for followup and is doing well.  She denies any chest pain or pressure, SOB, DOE, PND, orthopnea, dizziness, palpitations or syncope. Occasionally she will have some LE edema but not bothersome.  She is compliant with her meds and is tolerating meds with no SE.  She is doing well with her CPAP device.  She tolerates the nasal pillow mask and feels the pressure is adequate.  Since going on CPAP She feels rested in the am and has no significant daytime sleepiness.  She has occasional mouthl dryness.  She does not think that she snores.     Past Medical History:  Diagnosis Date  . Allergic rhinitis, cause unspecified   . Disorder of bone and cartilage, unspecified   . Diverticulosis of colon (without mention of hemorrhage)   . Elbow fracture, right 12/12  . Essential hypertension, benign   . Family history of malignant neoplasm of breast   . H/O: rheumatic fever   . Heart murmur    dx as child - s/p rheumatic fever  . Leukocytopenia, unspecified   . Lung nodule 06/2009   CT of chest, 4 mm subpleural nodule in the superior segment of the right lower lobe  . OSA (obstructive sleep apnea)    Severe w AHI 29/hr and successful CPAP now on auto CPAP  . Osteopenia   . Other specified  disorder of skin   . Persistent atrial fibrillation ([place]) 05/04/2012   on systemic anticoagulation w Xarelto for CHADS VASC score of 3  . Persistent atrial fibrillation ([place]) 05/04/2012  . Pneumonia, organism unspecified(486)   . Pulmonary HTN ([place])    resolved on echo 10/2015  . Sleep apnea    cpap  . Unspecified sinusitis (chronic)     Past Surgical History:  Procedure Laterality Date  . BREAST BIOPSY Left    biopsy - benign  . DILATION AND CURETTAGE OF UTERUS    . FOOT SURGERY Left   . TONSILLECTOMY    . TUBAL LIGATION      Current Medications: Current Meds  Medication Sig  . cetirizine (ZYRTEC) 10 MG tablet Take 10 mg by mouth daily as needed. For allergies  . diltiazem (CARDIZEM CD) 120 MG 24 hr capsule Take 1 capsule (120 mg total) by mouth daily.  Marland Kitchen estradiol (ESTRACE) 0.1 MG/GM vaginal cream Place 1 Applicatorful vaginally once a week.   . flecainide (TAMBOCOR) 100 MG tablet Take 1 tablet (100 mg total) by mouth 2 (two) times daily.  . furosemide (LASIX) 20 MG tablet Take 1 tablet (20 mg total) by mouth daily as needed for edema.  . hydrocortisone 2.5 % cream Apply 1 application topically  2 (two) times daily. Apply to lower lip BID  . losartan-hydrochlorothiazide (HYZAAR) 50-12.5 MG tablet Take 1 tablet by mouth daily.  . Magnesium 400 MG CAPS Take 400 mg by mouth daily.  . mometasone (NASONEX) 50 MCG/ACT nasal spray Place 2 sprays into the nose daily as needed (allergies).   . Multiple Vitamins-Minerals (CENTRUM PO) Take 1 tablet by mouth daily.    . NON FORMULARY CPAP at bedtime  . Omega-3 Fatty Acids (FISH OIL) 1200 MG CAPS Take 2 capsules by mouth daily.    . rivaroxaban (XARELTO) 20 MG TABS tablet TAKE 1 TABLET BY MOUTH DAILY WITH DINNER   Current Facility-Administered Medications for the 08/13/17 encounter (Office Visit) with Sueanne Margarita, MD  Medication  . 0.9 %  sodium chloride infusion     Allergies:   Ciprofloxacin and Naproxen   Social History    Socioeconomic History  . Marital status: Married    Spouse name: Erin Alvarez  . Number of children: 2  . Years of education: None  . Highest education level: None  Social Needs  . Financial resource strain: None  . Food insecurity - worry: None  . Food insecurity - inability: None  . Transportation needs - medical: None  . Transportation needs - non-medical: None  Occupational History  . Occupation: Retired Geophysical data processor: UNEMPLOYED  Tobacco Use  . Smoking status: Former Smoker    Packs/day: 2.00    Years: 15.00    Pack years: 30.00    Last attempt to quit: 06/22/1974    Years since quitting: 43.1  . Smokeless tobacco: Never Used  Substance and Sexual Activity  . Alcohol use: Yes    Alcohol/week: 0.0 oz    Comment: rare wine  . Drug use: No  . Sexual activity: None  Other Topics Concern  . None  Social History Narrative   Married   2 children   Quit tobacco 1976 - smoked 2 ppd x 15   Retired - Emergency planning/management officer   Alcohol use: yes   Son coming in overseas - living in [place]     Family History: The patient's family history includes Brain cancer in her mother; Emphysema in her father; Heart disease (age of onset: 90) in her mother; Hyperlipidemia in her brother and other; Hypertension in her brother, father, and other; Lung cancer in her mother.  ROS:   Please see the history of present illness.    ROS  All other systems reviewed and negative.   EKGs/Labs/Other Studies Reviewed:    The following studies were reviewed today: CPAP donwload  EKG:  EKG is not ordered today.    Recent Labs: No results found for requested labs within last 8760 hours.   Recent Lipid Panel    Component Value Date/Time   CHOL 169 07/03/2010 2029   TRIG 52 07/03/2010 2029   HDL 72 07/03/2010 2029   CHOLHDL 2.3 Ratio 07/03/2010 2029   VLDL 10 07/03/2010 2029   LDLCALC 87 07/03/2010 2029    Physical Exam:    VS:  BP (!) 114/54   Pulse (!) 55   Ht 5\' 5"  (1.651 m)   Wt 186  lb 1.9 oz (84.4 kg)   SpO2 97%   BMI 30.97 kg/m     Wt Readings from Last 3 Encounters:  08/13/17 186 lb 1.9 oz (84.4 kg)  12/07/16 184 lb (83.5 kg)  11/12/16 184 lb (83.5 kg)     GEN:  Well nourished, well developed  in no acute distress HEENT: Normal NECK: No JVD; No carotid bruits LYMPHATICS: No lymphadenopathy CARDIAC: RRR, no rubs, gallops.  1/6 SM at RUSB to LLSB RESPIRATORY:  Clear to auscultation without rales, wheezing or rhonchi  ABDOMEN: Soft, non-tender, non-distended MUSCULOSKELETAL:  No edema; No deformity  SKIN: Warm and dry NEUROLOGIC:  Alert and oriented x 3 PSYCHIATRIC:  Normal affect   ASSESSMENT:    1. Persistent atrial fibrillation ([place])   2. HYPERTENSION, BENIGN ESSENTIAL   3. Pulmonary HTN ([place])   4. OSA (obstructive sleep apnea)    PLAN:    In order of problems listed above:  1.  Persistent atrial fibrillation -she is maintaining normal sinus rhythm on exam today.  She will continue Xarelto 20 mg daily, flecainide 100 mg twice daily and Cardizem CD 120 mg daily. She has not had any bleeding issues on the NOAC.   I will check a Bmet and CBC.  2.  HTN -her blood pressure is well controlled on exam today.  She will continue on losartan HCT 50-12.5 mg daily and Cardizem CD 120 mg daily.  3.  Pulmonary HTN -PA systolic pressure by echo 2017 was 33 mmHg which is normal range.    4.  OSA - the patient is tolerating PAP therapy well without any problems. The PAP download was reviewed today and showed an AHI of 2.3/hr on 8 cm H2O with 100% compliance in using more than 4 hours nightly.  The patient has been using and benefiting from PAP use and will continue to benefit from therapy.      Medication Adjustments/Labs and Tests Ordered: Current medicines are reviewed at length with the patient today.  Concerns regarding medicines are outlined above.  No orders of the defined types were placed in this encounter.  No orders of the defined types were placed in  this encounter.   Signed, [name], MD  08/13/2017 2:07 PM    [place]

## 2017-08-14 LAB — BASIC METABOLIC PANEL
BUN/Creatinine Ratio: 20 (ref 12–28)
BUN: 17 mg/dL (ref 8–27)
CALCIUM: 9.5 mg/dL (ref 8.7–10.3)
CO2: 26 mmol/L (ref 20–29)
CREATININE: 0.85 mg/dL (ref 0.57–1.00)
Chloride: 102 mmol/L (ref 96–106)
GFR, EST AFRICAN AMERICAN: 79 mL/min/{1.73_m2} (ref >59)
GFR, EST NON AFRICAN AMERICAN: 68 mL/min/{1.73_m2} (ref >59)
Glucose: 78 mg/dL (ref 65–99)
Potassium: 3.7 mmol/L (ref 3.5–5.2)
Sodium: 145 mmol/L — ABNORMAL HIGH (ref 134–144)

## 2017-08-14 LAB — CBC
HEMATOCRIT: 45.3 % (ref 34.0–46.6)
HEMOGLOBIN: 15.3 g/dL (ref 11.1–15.9)
MCH: 30.1 pg (ref 26.6–33.0)
MCHC: 33.8 g/dL (ref 31.5–35.7)
MCV: 89 fL (ref 79–97)
Platelets: 233 10*3/uL (ref 150–379)
RBC: 5.08 x10E6/uL (ref 3.77–5.28)
RDW: 13.1 % (ref 12.3–15.4)
WBC: 5.5 10*3/uL (ref 3.4–10.8)

## 2017-08-18 DIAGNOSIS — Z01419 Encounter for gynecological examination (general) (routine) without abnormal findings: Secondary | ICD-10-CM | POA: Diagnosis not present

## 2017-08-18 DIAGNOSIS — N952 Postmenopausal atrophic vaginitis: Secondary | ICD-10-CM | POA: Diagnosis not present

## 2017-08-18 DIAGNOSIS — Z6831 Body mass index (BMI) 31.0-31.9, adult: Secondary | ICD-10-CM | POA: Diagnosis not present

## 2017-08-31 DIAGNOSIS — H34813 Central retinal vein occlusion, bilateral, with macular edema: Secondary | ICD-10-CM | POA: Diagnosis not present

## 2017-08-31 DIAGNOSIS — Z0100 Encounter for examination of eyes and vision without abnormal findings: Secondary | ICD-10-CM | POA: Diagnosis not present

## 2017-08-31 DIAGNOSIS — H2513 Age-related nuclear cataract, bilateral: Secondary | ICD-10-CM | POA: Diagnosis not present

## 2017-08-31 DIAGNOSIS — H0100A Unspecified blepharitis right eye, upper and lower eyelids: Secondary | ICD-10-CM | POA: Diagnosis not present

## 2017-08-31 DIAGNOSIS — H5203 Hypermetropia, bilateral: Secondary | ICD-10-CM | POA: Diagnosis not present

## 2017-09-08 ENCOUNTER — Other Ambulatory Visit: Payer: Self-pay | Admitting: *Deleted

## 2017-09-08 ENCOUNTER — Other Ambulatory Visit: Payer: Self-pay | Admitting: Cardiology

## 2017-09-08 MED ORDER — FLECAINIDE ACETATE 100 MG PO TABS: 100.0000 mg | ORAL_TABLET | Freq: Two times a day (BID) | ORAL | 3 refills | Status: DC

## 2017-09-08 MED ORDER — RIVAROXABAN 20 MG PO TABS: ORAL_TABLET | ORAL | 1 refills | Status: DC

## 2017-09-08 MED ORDER — DILTIAZEM HCL ER COATED BEADS 120 MG PO CP24: 120.0000 mg | ORAL_CAPSULE | Freq: Every day | ORAL | 3 refills | Status: DC

## 2017-09-08 MED ORDER — LOSARTAN POTASSIUM-HCTZ 50-12.5 MG PO TABS: 1.0000 | ORAL_TABLET | Freq: Every day | ORAL | 3 refills | Status: DC

## 2017-09-08 NOTE — Telephone Encounter (Signed)
Age 72 years WT 84.4kg   08/13/2017 Saw Dr [name] 08/13/2017 08/13/2017 Hgb 15.3 HCT 45.3 08/13/2017 SrCr 0.85 CrCl 78.53 Refill done for Xarelto 20 mg daily as requested

## 2017-11-01 DIAGNOSIS — M859 Disorder of bone density and structure, unspecified: Secondary | ICD-10-CM | POA: Diagnosis not present

## 2017-11-01 DIAGNOSIS — R82998 Other abnormal findings in urine: Secondary | ICD-10-CM | POA: Diagnosis not present

## 2017-11-01 DIAGNOSIS — I10 Essential (primary) hypertension: Secondary | ICD-10-CM | POA: Diagnosis not present

## 2017-11-04 DIAGNOSIS — Z1231 Encounter for screening mammogram for malignant neoplasm of breast: Secondary | ICD-10-CM | POA: Diagnosis not present

## 2017-11-08 DIAGNOSIS — Z0000 Encounter for general adult medical examination without abnormal findings: Secondary | ICD-10-CM | POA: Diagnosis not present

## 2017-11-08 DIAGNOSIS — Z1389 Encounter for screening for other disorder: Secondary | ICD-10-CM | POA: Diagnosis not present

## 2017-11-08 DIAGNOSIS — G4733 Obstructive sleep apnea (adult) (pediatric): Secondary | ICD-10-CM | POA: Diagnosis not present

## 2017-11-08 DIAGNOSIS — I10 Essential (primary) hypertension: Secondary | ICD-10-CM | POA: Diagnosis not present

## 2017-11-08 DIAGNOSIS — Z6829 Body mass index (BMI) 29.0-29.9, adult: Secondary | ICD-10-CM | POA: Diagnosis not present

## 2017-11-08 DIAGNOSIS — I4891 Unspecified atrial fibrillation: Secondary | ICD-10-CM | POA: Diagnosis not present

## 2017-11-08 DIAGNOSIS — M859 Disorder of bone density and structure, unspecified: Secondary | ICD-10-CM | POA: Diagnosis not present

## 2017-11-08 DIAGNOSIS — M25512 Pain in left shoulder: Secondary | ICD-10-CM | POA: Diagnosis not present

## 2017-11-08 DIAGNOSIS — D179 Benign lipomatous neoplasm, unspecified: Secondary | ICD-10-CM | POA: Diagnosis not present

## 2017-11-08 DIAGNOSIS — I2789 Other specified pulmonary heart diseases: Secondary | ICD-10-CM | POA: Diagnosis not present

## 2017-11-12 DIAGNOSIS — Z1212 Encounter for screening for malignant neoplasm of rectum: Secondary | ICD-10-CM | POA: Diagnosis not present

## 2018-01-24 DIAGNOSIS — R509 Fever, unspecified: Secondary | ICD-10-CM | POA: Diagnosis not present

## 2018-01-24 DIAGNOSIS — Z6828 Body mass index (BMI) 28.0-28.9, adult: Secondary | ICD-10-CM | POA: Diagnosis not present

## 2018-01-24 DIAGNOSIS — J019 Acute sinusitis, unspecified: Secondary | ICD-10-CM | POA: Diagnosis not present

## 2018-01-24 DIAGNOSIS — J181 Lobar pneumonia, unspecified organism: Secondary | ICD-10-CM | POA: Diagnosis not present

## 2018-01-24 DIAGNOSIS — R05 Cough: Secondary | ICD-10-CM | POA: Diagnosis not present

## 2018-03-01 DIAGNOSIS — J181 Lobar pneumonia, unspecified organism: Secondary | ICD-10-CM | POA: Diagnosis not present

## 2018-03-02 DIAGNOSIS — M25562 Pain in left knee: Secondary | ICD-10-CM | POA: Diagnosis not present

## 2018-03-02 DIAGNOSIS — Z6828 Body mass index (BMI) 28.0-28.9, adult: Secondary | ICD-10-CM | POA: Diagnosis not present

## 2018-03-25 ENCOUNTER — Other Ambulatory Visit: Payer: Self-pay | Admitting: Cardiology

## 2018-03-25 NOTE — Telephone Encounter (Signed)
Pt is a 72 yr old female who last saw Dr [name] on 08/13/17. Weight at that visit was 84.4Kg. SCr was 0.85 on 08/13/17. CrCl is 74mLmin. Will refill Xarelto 20mg  QD.

## 2018-06-13 DIAGNOSIS — H1132 Conjunctival hemorrhage, left eye: Secondary | ICD-10-CM | POA: Diagnosis not present

## 2018-06-27 DIAGNOSIS — L821 Other seborrheic keratosis: Secondary | ICD-10-CM | POA: Diagnosis not present

## 2018-06-27 DIAGNOSIS — L711 Rhinophyma: Secondary | ICD-10-CM | POA: Diagnosis not present

## 2018-06-27 DIAGNOSIS — L2089 Other atopic dermatitis: Secondary | ICD-10-CM | POA: Diagnosis not present

## 2018-06-27 DIAGNOSIS — L820 Inflamed seborrheic keratosis: Secondary | ICD-10-CM | POA: Diagnosis not present

## 2018-06-27 DIAGNOSIS — C44622 Squamous cell carcinoma of skin of right upper limb, including shoulder: Secondary | ICD-10-CM | POA: Diagnosis not present

## 2018-08-03 DIAGNOSIS — R69 Illness, unspecified: Secondary | ICD-10-CM | POA: Diagnosis not present

## 2018-08-12 DIAGNOSIS — R509 Fever, unspecified: Secondary | ICD-10-CM | POA: Diagnosis not present

## 2018-08-12 DIAGNOSIS — J069 Acute upper respiratory infection, unspecified: Secondary | ICD-10-CM | POA: Diagnosis not present

## 2018-08-12 DIAGNOSIS — Z6826 Body mass index (BMI) 26.0-26.9, adult: Secondary | ICD-10-CM | POA: Diagnosis not present

## 2018-08-12 DIAGNOSIS — J111 Influenza due to unidentified influenza virus with other respiratory manifestations: Secondary | ICD-10-CM | POA: Diagnosis not present

## 2018-08-16 ENCOUNTER — Encounter: Payer: Self-pay | Admitting: Physician Assistant

## 2018-08-16 NOTE — Progress Notes (Addendum)
Cardiology Office Note    Date:  08/18/2018  ID:  Erin Alvarez April 12, 1946, MRN [id number] PCP:  [name], MD  Cardiologist:  [name], MD   Chief Complaint: routine f/u afib  History of Present Illness:  Erin Alvarez is a 73 y.o. female with history of persistent atrial fibrillation, HTN, prior pulmonary HTN, OSA, rheumatic fever as a child, diverticulosis who presents for annual follow-up. She was previously seen by Dr. Stanford Alvarez. She has history of atypical chest pain with prior normal stress testing. In 2013 she was admitted with new onset atrial fibrillation. She has been on flecainide and Xarelto for many years. Last nuc in 2017 was low risk without obvious ischemia - probable normal perfusion and soft tissue attenuation. ETT 06/2016 showed impaired exercise tolerance but otherwise normal ECG stress test (achieved 77% MPHR). She carries a prior history of pulm HTN but this normalized by last echo 10/2015 - EF 55%, moderate aortic sclerosis, mild MR/TR. Last labs 07/2017 showed K 3.7, Cr 0.85, Hgb 15.3, Plt 233, 2018 PCP labs LDL 117, TSH wnl.  She presents back for follow-up feeling well. She denies any CP, palpitations or DOE. She is regularly active without any exertional limitation. She has been under increased stress lately. Her aunt died and she had to move her to a SNF and is now going through the task of cleaning out her aunt's home. She has recently been treated for bronchitis and noticed 3 times starting 3 weeks ago while at rest she would feel briefly her breath taken away for a few seconds without provocation. She wonders if it is related to some sort of panic/stress reaction as she has not had any issues with exertion or orthopnea. This did not feel like prior AF. No bleeding. Her drug rep friend suggested she switch to Eliquis and she is interested in doing so. Reports compliance with nasal O2 replacement for OSA, at least 8 hr per day.   Past Medical History:    Diagnosis Date  . Allergic rhinitis, cause unspecified   . Disorder of bone and cartilage, unspecified   . Diverticulosis of colon (without mention of hemorrhage)   . Elbow fracture, right 12/12  . Essential hypertension, benign   . Family history of malignant neoplasm of breast   . H/O: rheumatic fever   . Heart murmur    dx as child - s/p rheumatic fever  . Leukocytopenia, unspecified   . Lung nodule 06/2009   CT of chest, 4 mm subpleural nodule in the superior segment of the right lower lobe  . Mild mitral regurgitation   . Mild tricuspid regurgitation   . OSA (obstructive sleep apnea)    Severe w AHI 29/hr and successful CPAP now on auto CPAP  . Osteopenia   . Persistent atrial fibrillation 05/04/2012   on systemic anticoagulation w Xarelto for CHADS VASC score of 3  . Pneumonia, organism unspecified(486)   . Pulmonary HTN ([place])    resolved on echo 10/2015  . Sleep apnea    cpap  . Unspecified sinusitis (chronic)     Past Surgical History:  Procedure Laterality Date  . BREAST BIOPSY Left    biopsy - benign  . DILATION AND CURETTAGE OF UTERUS    . FOOT SURGERY Left   . TONSILLECTOMY    . TUBAL LIGATION      Current Medications: Current Meds  Medication Sig  . cetirizine (ZYRTEC) 10 MG tablet Take 10 mg by mouth daily  as needed. For allergies  . diltiazem (CARDIZEM CD) 120 MG 24 hr capsule Take 1 capsule (120 mg total) by mouth daily.  Marland Kitchen estradiol (ESTRACE) 0.1 MG/GM vaginal cream Place 1 Applicatorful vaginally once a week.   . flecainide (TAMBOCOR) 100 MG tablet Take 1 tablet (100 mg total) by mouth 2 (two) times daily.  . furosemide (LASIX) 20 MG tablet Take 1 tablet (20 mg total) by mouth daily as needed for edema.  . hydrocortisone 2.5 % cream Apply 1 application topically 2 (two) times daily. Apply to lower lip BID  . losartan-hydrochlorothiazide (HYZAAR) 50-12.5 MG tablet Take 1 tablet by mouth daily.  . Magnesium 400 MG CAPS Take 400 mg by mouth daily.  .  mometasone (NASONEX) 50 MCG/ACT nasal spray Place 2 sprays into the nose daily as needed (allergies).   . Multiple Vitamins-Minerals (CENTRUM PO) Take 1 tablet by mouth daily.    . NON FORMULARY CPAP at bedtime  . Omega-3 Fatty Acids (FISH OIL) 1200 MG CAPS Take 2 capsules by mouth daily.    [name] 20 MG TABS tablet TAKE 1 TABLET DAILY WITH   DINNER   Current Facility-Administered Medications for the 08/18/18 encounter (Office Visit) with Erin Pitter, PA-C  Medication  . 0.9 %  sodium chloride infusion    Allergies:   Ciprofloxacin and Naproxen   Social History   Socioeconomic History  . Marital status: Married    Spouse name: Richard  . Number of children: 2  . Years of education: Not on file  . Highest education level: Not on file  Occupational History  . Occupation: Retired Geophysical data processor: UNEMPLOYED  Social Needs  . Financial resource strain: Not on file  . Food insecurity:    Worry: Not on file    Inability: Not on file  . Transportation needs:    Medical: Not on file    Non-medical: Not on file  Tobacco Use  . Smoking status: Former Smoker    Packs/day: 2.00    Years: 15.00    Pack years: 30.00    Last attempt to quit: 06/22/1974    Years since quitting: 44.1  . Smokeless tobacco: Never Used  Substance and Sexual Activity  . Alcohol use: Yes    Alcohol/week: 0.0 standard drinks    Comment: rare wine  . Drug use: No  . Sexual activity: Not on file  Lifestyle  . Physical activity:    Days per week: Not on file    Minutes per session: Not on file  . Stress: Not on file  Relationships  . Social connections:    Talks on phone: Not on file    Gets together: Not on file    Attends religious service: Not on file    Active member of club or organization: Not on file    Attends meetings of clubs or organizations: Not on file    Relationship status: Not on file  Other Topics Concern  . Not on file  Social History Narrative   Married   2 children    Quit tobacco 1976 - smoked 2 ppd x 15   Retired - Emergency planning/management officer   Alcohol use: yes   Son coming in overseas - living in [place]     Family History:  The patient's family history includes Brain cancer in her mother; Emphysema in her father; Heart disease (age of onset: 68) in her mother; Hyperlipidemia in her brother and another family member; Hypertension in  her brother, father, and another family member; Lung cancer in her mother.  ROS:   Please see the history of present illness.  All other systems are reviewed and otherwise negative.    PHYSICAL EXAM:   VS:  BP 124/62   Pulse (!) 54   Ht 5\' 5"  (1.651 m)   Wt 174 lb (78.9 kg)   BMI 28.96 kg/m   BMI: Body mass index is 28.96 kg/m. GEN: Well nourished, well developed WF, in no acute distress HEENT: normocephalic, atraumatic Neck: no JVD, carotid bruits, or masses Cardiac: RRR; 2/6 SEM at LSB. No rubs or gallops, no edema  Respiratory:  clear to auscultation bilaterally, normal work of breathing GI: soft, nontender, nondistended, + BS MS: no deformity or atrophy Skin: warm and dry, no rash Neuro:  Alert and Oriented x 3, Strength and sensation are intact, follows commands Psych: euthymic mood, full affect  Wt Readings from Last 3 Encounters:  08/18/18 174 lb (78.9 kg)  08/13/17 186 lb 1.9 oz (84.4 kg)  12/07/16 184 lb (83.5 kg)      Studies/Labs Reviewed:   EKG:  EKG was ordered today and personally reviewed by me and demonstrates sinus bradycardia 54bpm with first degree AVB, QTc 412ms  Recent Labs: No results found for requested labs within last 8760 hours.   Lipid Panel    Component Value Date/Time   CHOL 169 07/03/2010 2029   TRIG 52 07/03/2010 2029   HDL 72 07/03/2010 2029   CHOLHDL 2.3 Ratio 07/03/2010 2029   VLDL 10 07/03/2010 2029   LDLCALC 87 07/03/2010 2029    Additional studies/ records that were reviewed today include: Summarized above   ASSESSMENT & PLAN:   1. Persistent atrial fib -  maintaining NSR on flecainide. She is interested in switching anticoag. She wants to use up her Xarelto then switch to Eliquis - will use 5mg  BID. First dose will be when her next dose of Xarelto would've come due after she runs out of that. Update BMET, Mg, CBC. Of note she reports 3 odd episodes of feeling like her breath leaves for a few seconds in setting of increased stress and recent bronchitis which she feels like she's finally recovering from. This is not associated with any palpitations, which she was acutely aware of in the past. She has not had any chest pain or exertional dyspnea. Will f/u echocardiogram to eval. I told her if this recurs to let us know and we would place a 30 day monitor. I will also review plan with Dr. [name]. 2. Essential HTN - controlled. 3. Pulmonary HTN - resolved by last echo. 4. Mild MR/TR - will f/u echo to trend. 5. OSA - Dr. Theodosia Blender nurse came to retrieve chip here in the office for Dr. Theodosia Blender review. Their sleep team will call her with further plans/follow-up. She requests a new machine as hers is >30 years old per her report.  Disposition: F/u with Dr. [name] in 1 year.  Medication Adjustments/Labs and Tests Ordered: Current medicines are reviewed at length with the patient today.  Concerns regarding medicines are outlined above. Medication changes, Labs and Tests ordered today are summarized above and listed in the Patient Instructions accessible in Encounters.   Signed, Erin Pitter, PA-C  08/18/2018 4:12 PM    [facility] [address], [place], [place]  [address] Phone: [phone]; Fax: [phone]

## 2018-08-18 ENCOUNTER — Ambulatory Visit: Payer: Medicare HMO | Admitting: Physician Assistant

## 2018-08-18 ENCOUNTER — Encounter: Payer: Self-pay | Admitting: Physician Assistant

## 2018-08-18 VITALS — BP 124/62 | HR 54 | Ht 65.0 in | Wt 174.0 lb

## 2018-08-18 DIAGNOSIS — I340 Nonrheumatic mitral (valve) insufficiency: Secondary | ICD-10-CM

## 2018-08-18 DIAGNOSIS — G4733 Obstructive sleep apnea (adult) (pediatric): Secondary | ICD-10-CM

## 2018-08-18 DIAGNOSIS — I071 Rheumatic tricuspid insufficiency: Secondary | ICD-10-CM

## 2018-08-18 DIAGNOSIS — I10 Essential (primary) hypertension: Secondary | ICD-10-CM

## 2018-08-18 DIAGNOSIS — Z9989 Dependence on other enabling machines and devices: Secondary | ICD-10-CM | POA: Diagnosis not present

## 2018-08-18 DIAGNOSIS — I272 Pulmonary hypertension, unspecified: Secondary | ICD-10-CM | POA: Diagnosis not present

## 2018-08-18 DIAGNOSIS — I4819 Other persistent atrial fibrillation: Secondary | ICD-10-CM | POA: Diagnosis not present

## 2018-08-18 MED ORDER — APIXABAN 5 MG PO TABS: 5.0000 mg | ORAL_TABLET | Freq: Two times a day (BID) | ORAL | 3 refills | Status: DC

## 2018-08-18 NOTE — Patient Instructions (Addendum)
Medication Instructions:  Your physician has recommended you make the following change in your medication:  1.  START Eliquis 5 mg twice a day.Marland Kitchen YOUR 1ST DOSE WILL BE THE NIGHT AFTER YOU TAKE YOUR LAST XARELTO   If you need a refill on your cardiac medications before your next appointment, please call your pharmacy.   Lab work: TODAY:  BMET & CBC  If you have labs (blood work) drawn today and your tests are completely normal, you will receive your results only by: Marland Kitchen MyChart Message (if you have MyChart) OR . A paper copy in the mail If you have any lab test that is abnormal or we need to change your treatment, we will call you to review the results.  Testing/Procedures: Your physician has requested that you have an echocardiogram. Echocardiography is a painless test that uses sound waves to create images of your heart. It provides your doctor with information about the size and shape of your heart and how well your heart's chambers and valves are working. This procedure takes approximately one hour. There are no restrictions for this procedure.    Follow-Up: At [facility], you and your health needs are our priority.  As part of our continuing mission to provide you with exceptional heart care, we have created designated Provider Care Teams.  These Care Teams include your primary Cardiologist (physician) and Advanced Practice Providers (APPs -  Physician Assistants and Nurse Practitioners) who all work together to provide you with the care you need, when you need it. You will need a follow up appointment in 12 months.  Please call our office 2 months in advance to schedule this appointment.  You may see [name], MD or one of the following Advanced Practice Providers on your designated Care Team:   Wallis, PA-C Melina Copa, PA-C . Ermalinda Barrios, PA-C  Any Other Special Instructions Will Be Listed Below (If Applicable).

## 2018-08-19 ENCOUNTER — Telehealth: Payer: Self-pay | Admitting: Physician Assistant

## 2018-08-19 ENCOUNTER — Telehealth: Payer: Self-pay | Admitting: *Deleted

## 2018-08-19 DIAGNOSIS — R0602 Shortness of breath: Secondary | ICD-10-CM

## 2018-08-19 DIAGNOSIS — Z5181 Encounter for therapeutic drug level monitoring: Secondary | ICD-10-CM

## 2018-08-19 DIAGNOSIS — Z79899 Other long term (current) drug therapy: Secondary | ICD-10-CM

## 2018-08-19 DIAGNOSIS — I4819 Other persistent atrial fibrillation: Secondary | ICD-10-CM

## 2018-08-19 LAB — BASIC METABOLIC PANEL
BUN/Creatinine Ratio: 22 (ref 12–28)
BUN: 14 mg/dL (ref 8–27)
CALCIUM: 9.4 mg/dL (ref 8.7–10.3)
CO2: 26 mmol/L (ref 20–29)
CREATININE: 0.65 mg/dL (ref 0.57–1.00)
Chloride: 100 mmol/L (ref 96–106)
GFR calc Af Amer: 101 mL/min/{1.73_m2} (ref >59)
GFR, EST NON AFRICAN AMERICAN: 88 mL/min/{1.73_m2} (ref >59)
Glucose: 96 mg/dL (ref 65–99)
POTASSIUM: 4.5 mmol/L (ref 3.5–5.2)
Sodium: 142 mmol/L (ref 134–144)

## 2018-08-19 LAB — CBC
Hematocrit: 42.8 % (ref 34.0–46.6)
Hemoglobin: 14.3 g/dL (ref 11.1–15.9)
MCH: 29.6 pg (ref 26.6–33.0)
MCHC: 33.4 g/dL (ref 31.5–35.7)
MCV: 89 fL (ref 79–97)
Platelets: 250 10*3/uL (ref 150–450)
RBC: 4.83 x10E6/uL (ref 3.77–5.28)
RDW: 11.8 % (ref 11.7–15.4)
WBC: 6.3 10*3/uL (ref 3.4–10.8)

## 2018-08-19 NOTE — Telephone Encounter (Signed)
-----   Message from Jeanann Lewandowsky, [place] sent at 08/18/2018  4:21 PM EST ----- Regarding: new device / supplies ordered We have ordered this pt a new cpap device and supplies.  Thanks

## 2018-08-19 NOTE — Telephone Encounter (Signed)
Called pt re: message below.  Left a message for her to call back.  

## 2018-08-19 NOTE — Telephone Encounter (Signed)
[facility] to [facility] today via fax.

## 2018-08-19 NOTE — Telephone Encounter (Signed)
Please call pt again. I reviewed her case with Dr. [name] because one of the outstanding questions would be whether we need to update her ischemic testing given her episodes of SOB she reported. This is a particularly sensitive issue while someone is on flecainide because if there is any evidence of decreased blood flow to the heart, we would need to make plans for a different anti-arrhythmic. That being said, Dr [name] suggests we go ahead and update her ETT as well (last in 2018) to make sure no abnormalities to explain those odd episodes of SOB, and make sure still good to continue flecainide. Per discussion with Dr [name] she wants patient to continue flecainide day of the test but hold diltiazem that morning (she should make sure to still take that day after the test though and not miss the whole day's dose) Dayna Dunn PA-C

## 2018-08-22 ENCOUNTER — Telehealth: Payer: Self-pay | Admitting: *Deleted

## 2018-08-22 ENCOUNTER — Telehealth: Payer: Self-pay | Admitting: Cardiology

## 2018-08-22 NOTE — Telephone Encounter (Signed)
Informed patient of compliance results and verbalized understanding was indicated. Patient is aware and agreeable to AHI being within range at 1.4. Patient is aware and agreeable to being in compliance with machine usage. Patient is aware and agreeable to no change in current pressures. 

## 2018-08-22 NOTE — Telephone Encounter (Signed)
New Message  Pt is returning phone call. Please call back 

## 2018-08-22 NOTE — Telephone Encounter (Signed)
Patient was returning a call to [employer].

## 2018-08-22 NOTE — Telephone Encounter (Signed)
-----   Message from Sueanne Margarita, MD sent at 08/20/2018  2:39 PM EST ----- Good AHI and compliance.  Continue current PAP settings.

## 2018-08-22 NOTE — Telephone Encounter (Addendum)
Patient prefers [facility]. Order sent to [facility]. [facility] notified (kim) New Cpap order in Epic patient thinks her machine is 75 years old. Please call patient.

## 2018-08-23 NOTE — Telephone Encounter (Signed)
Spoke with pt and she has been made aware that Melina Copa, PA-C and Dr. [name] want to update her ETT due to her recent symptoms of SOB and pt being on Flecaininde and the need to make sure there is no abnormalities and medications have to be adjusted. Pt agreed. Pt aware that the order will be placed and someone from our scheduling dept will contact her to schedule. Pt thanked me for the call,

## 2018-08-26 ENCOUNTER — Telehealth: Payer: Self-pay | Admitting: Cardiology

## 2018-08-26 NOTE — Telephone Encounter (Signed)
Patient prefers [facility]. Order sent to [facility]. CHM notified (kim) New Cpap order in Epic patient thinks her machine is 75 years old.   Reached out to patient to explain that it takes 15 business days to hear back from her insurance after the dme sends it.  Pt is aware and agreeable to treatment.

## 2018-08-26 NOTE — Telephone Encounter (Signed)
New message:  Patient calling concerning her appt next week. Patient would like to know what do she need to do for this appt.

## 2018-08-26 NOTE — Telephone Encounter (Signed)
Follow up:    Patient calling about her CPAP machine is old and she a new one. Patient also stated that she called a few times concering this matter. Please call patient back. [phone]

## 2018-08-26 NOTE — Telephone Encounter (Signed)
Pt was called, informed that the stress testing department will call her prior to the test date and give her instructions. She also was informed that the cpap order was sent to [facility] and it may take up to a week to receive notification for a set up for a new machine. Pt verbalized understanding.

## 2018-08-30 ENCOUNTER — Other Ambulatory Visit (HOSPITAL_COMMUNITY): Payer: Medicare HMO

## 2018-09-02 ENCOUNTER — Other Ambulatory Visit (HOSPITAL_COMMUNITY): Payer: Medicare HMO

## 2018-09-02 ENCOUNTER — Ambulatory Visit (INDEPENDENT_AMBULATORY_CARE_PROVIDER_SITE_OTHER): Payer: Medicare HMO

## 2018-09-02 ENCOUNTER — Other Ambulatory Visit: Payer: Self-pay

## 2018-09-02 ENCOUNTER — Ambulatory Visit (HOSPITAL_COMMUNITY): Payer: Medicare HMO | Attending: Cardiology

## 2018-09-02 DIAGNOSIS — Z79899 Other long term (current) drug therapy: Secondary | ICD-10-CM | POA: Diagnosis not present

## 2018-09-02 DIAGNOSIS — I340 Nonrheumatic mitral (valve) insufficiency: Secondary | ICD-10-CM

## 2018-09-02 DIAGNOSIS — R0602 Shortness of breath: Secondary | ICD-10-CM | POA: Diagnosis not present

## 2018-09-02 DIAGNOSIS — I071 Rheumatic tricuspid insufficiency: Secondary | ICD-10-CM

## 2018-09-02 DIAGNOSIS — I10 Essential (primary) hypertension: Secondary | ICD-10-CM | POA: Diagnosis present

## 2018-09-02 DIAGNOSIS — I272 Pulmonary hypertension, unspecified: Secondary | ICD-10-CM | POA: Diagnosis not present

## 2018-09-02 DIAGNOSIS — Z5181 Encounter for therapeutic drug level monitoring: Secondary | ICD-10-CM

## 2018-09-02 DIAGNOSIS — Z9989 Dependence on other enabling machines and devices: Secondary | ICD-10-CM

## 2018-09-02 DIAGNOSIS — G4733 Obstructive sleep apnea (adult) (pediatric): Secondary | ICD-10-CM

## 2018-09-02 DIAGNOSIS — I4819 Other persistent atrial fibrillation: Secondary | ICD-10-CM | POA: Diagnosis not present

## 2018-09-05 DIAGNOSIS — G4733 Obstructive sleep apnea (adult) (pediatric): Secondary | ICD-10-CM | POA: Diagnosis not present

## 2018-09-05 LAB — EXERCISE TOLERANCE TEST
CSEPED: 4 min
Estimated workload: 7 METS
Exercise duration (sec): 52 s
MPHR: 146 {beats}/min
Peak HR: 114 {beats}/min
Percent HR: 78 %
RPE: 16
Rest HR: 49 {beats}/min

## 2018-09-06 ENCOUNTER — Other Ambulatory Visit: Payer: Self-pay | Admitting: Cardiology

## 2018-09-06 ENCOUNTER — Telehealth: Payer: Self-pay | Admitting: Physician Assistant

## 2018-09-06 MED ORDER — APIXABAN 5 MG PO TABS: 5.0000 mg | ORAL_TABLET | Freq: Two times a day (BID) | ORAL | 3 refills | Status: DC

## 2018-09-06 NOTE — Telephone Encounter (Signed)
Pt stated she has not had any more episodes of SOB at this time.

## 2018-09-06 NOTE — Telephone Encounter (Signed)
Patient calling for Echo results 

## 2018-09-06 NOTE — Telephone Encounter (Signed)
Called pt regarding ETT results. Pt verbalized understanding.

## 2018-09-06 NOTE — Telephone Encounter (Signed)
Result Notes for Exercise Tolerance Test   Notes recorded by Michae Kava, CMA on 09/05/2018 at 3:00 PM EDT Left message to go over ETT results. Need to ask pt is she still having sob. ------  Notes recorded by Charlie Pitter, PA-C on 09/05/2018 at 12:31 PM EDT Please let pt know that although she did not reach target HR EKG did not show any abnormal changes. Has she had any more odd episodes of brief SOB? Hopefully feeling better? Dayna Dunn PA-C

## 2018-09-07 ENCOUNTER — Telehealth: Payer: Self-pay | Admitting: *Deleted

## 2018-09-07 NOTE — Telephone Encounter (Signed)
Called pt re: stress test results.  Left a message for pt to call back. 

## 2018-09-07 NOTE — Telephone Encounter (Signed)
-----   Message from Charlie Pitter, [place] sent at 09/05/2018 12:31 PM EDT ----- Please let pt know that although she did not reach target HR EKG did not show any abnormal changes. Has she had any more odd episodes of brief SOB? Hopefully feeling better? Dayna Dunn PA-C

## 2018-09-09 ENCOUNTER — Telehealth: Payer: Self-pay

## 2018-09-09 NOTE — Telephone Encounter (Signed)
Received notification from [facility] that Losartan is backordered. Can we reorder somewhere else?

## 2018-09-09 NOTE — Telephone Encounter (Signed)
Follow up    Patient would like a call back in reference to the previous message.

## 2018-09-10 NOTE — Telephone Encounter (Signed)
Please have her call her pharmacy to see if they can find it from another pharmacy - if no luck with that then call us back

## 2018-09-12 DIAGNOSIS — Z0100 Encounter for examination of eyes and vision without abnormal findings: Secondary | ICD-10-CM | POA: Diagnosis not present

## 2018-09-12 MED ORDER — HYDROCHLOROTHIAZIDE 12.5 MG PO CAPS: 12.5000 mg | ORAL_CAPSULE | Freq: Every day | ORAL | 3 refills | Status: DC

## 2018-09-12 MED ORDER — LOSARTAN POTASSIUM 50 MG PO TABS: 50.0000 mg | ORAL_TABLET | Freq: Every day | ORAL | 3 refills | Status: DC

## 2018-09-12 NOTE — Telephone Encounter (Signed)
Spoke with the patient, she expressed understanding. She had no further questions.

## 2018-09-12 NOTE — Telephone Encounter (Signed)
[facility] is out of the [facility], can we reorder for another pharmacy?

## 2018-09-12 NOTE — Telephone Encounter (Signed)
For now I do not want to go changing things since I want to avoid issues with having to go to lab or BP checks in office with a new med.  The other ARBs have similar issues

## 2018-09-12 NOTE — Telephone Encounter (Signed)
[facility] does not have the combination product, but they do have losartan 50mg  and HCTZ 12.5mg . I have sent in both Rx as patient can just take them together to equal losartan 50/12.5mg . Rx sent to [facility] in [place]. Please let the patient know. thanks

## 2018-09-12 NOTE — Telephone Encounter (Signed)
Spoke with the patient, she wants to know if she can get away from losartan so she does not have to worry about stocking issues. Sending to Dr. [name].

## 2018-09-12 NOTE — Telephone Encounter (Signed)
Ask our PHarmD for help with this

## 2018-09-16 ENCOUNTER — Encounter: Payer: Self-pay | Admitting: *Deleted

## 2018-09-16 NOTE — Telephone Encounter (Signed)
-----   Message from Charlie Pitter, [place] sent at 09/05/2018 12:31 PM EDT ----- Please let pt know that although she did not reach target HR EKG did not show any abnormal changes. Has she had any more odd episodes of brief SOB? Hopefully feeling better? Dayna Dunn PA-C

## 2018-09-16 NOTE — Telephone Encounter (Signed)
Notes recorded by Michae Kava, CMA on 09/16/2018 at 1:09 PM EDT Left message x 3 to go over test results. I will send out a letter to the pt today to call for her results

## 2018-09-23 ENCOUNTER — Telehealth: Payer: Self-pay | Admitting: Cardiology

## 2018-09-23 NOTE — Telephone Encounter (Signed)
Pt called because she got a letter in the mail to go over the results of a stress test.

## 2018-09-23 NOTE — Telephone Encounter (Signed)
Returned pt's call re: stress test results. She has been made aware of her results and reports that the sob has resolved and she feels fine.

## 2018-09-23 NOTE — Telephone Encounter (Signed)
Great news! Thanks

## 2018-10-06 DIAGNOSIS — H01004 Unspecified blepharitis left upper eyelid: Secondary | ICD-10-CM | POA: Diagnosis not present

## 2018-10-06 DIAGNOSIS — G4733 Obstructive sleep apnea (adult) (pediatric): Secondary | ICD-10-CM | POA: Diagnosis not present

## 2018-10-06 DIAGNOSIS — H01001 Unspecified blepharitis right upper eyelid: Secondary | ICD-10-CM | POA: Diagnosis not present

## 2018-10-06 DIAGNOSIS — H16203 Unspecified keratoconjunctivitis, bilateral: Secondary | ICD-10-CM | POA: Diagnosis not present

## 2018-11-01 DIAGNOSIS — H1820 Unspecified corneal edema: Secondary | ICD-10-CM | POA: Diagnosis not present

## 2018-11-01 DIAGNOSIS — H10411 Chronic giant papillary conjunctivitis, right eye: Secondary | ICD-10-CM | POA: Diagnosis not present

## 2018-11-05 DIAGNOSIS — G4733 Obstructive sleep apnea (adult) (pediatric): Secondary | ICD-10-CM | POA: Diagnosis not present

## 2018-11-07 DIAGNOSIS — Z1231 Encounter for screening mammogram for malignant neoplasm of breast: Secondary | ICD-10-CM | POA: Diagnosis not present

## 2018-11-07 DIAGNOSIS — Z803 Family history of malignant neoplasm of breast: Secondary | ICD-10-CM | POA: Diagnosis not present

## 2018-11-09 DIAGNOSIS — R82998 Other abnormal findings in urine: Secondary | ICD-10-CM | POA: Diagnosis not present

## 2018-11-09 DIAGNOSIS — R7989 Other specified abnormal findings of blood chemistry: Secondary | ICD-10-CM | POA: Diagnosis not present

## 2018-11-09 DIAGNOSIS — I10 Essential (primary) hypertension: Secondary | ICD-10-CM | POA: Diagnosis not present

## 2018-11-09 DIAGNOSIS — H1820 Unspecified corneal edema: Secondary | ICD-10-CM | POA: Diagnosis not present

## 2018-11-09 DIAGNOSIS — M859 Disorder of bone density and structure, unspecified: Secondary | ICD-10-CM | POA: Diagnosis not present

## 2018-11-10 DIAGNOSIS — Z0000 Encounter for general adult medical examination without abnormal findings: Secondary | ICD-10-CM | POA: Diagnosis not present

## 2018-11-10 DIAGNOSIS — I272 Pulmonary hypertension, unspecified: Secondary | ICD-10-CM | POA: Diagnosis not present

## 2018-11-10 DIAGNOSIS — Z1331 Encounter for screening for depression: Secondary | ICD-10-CM | POA: Diagnosis not present

## 2018-11-10 DIAGNOSIS — M8580 Other specified disorders of bone density and structure, unspecified site: Secondary | ICD-10-CM | POA: Diagnosis not present

## 2018-11-10 DIAGNOSIS — I4891 Unspecified atrial fibrillation: Secondary | ICD-10-CM | POA: Diagnosis not present

## 2018-11-10 DIAGNOSIS — M25569 Pain in unspecified knee: Secondary | ICD-10-CM | POA: Diagnosis not present

## 2018-11-10 DIAGNOSIS — I10 Essential (primary) hypertension: Secondary | ICD-10-CM | POA: Diagnosis not present

## 2018-11-10 DIAGNOSIS — G4733 Obstructive sleep apnea (adult) (pediatric): Secondary | ICD-10-CM | POA: Diagnosis not present

## 2018-11-15 ENCOUNTER — Other Ambulatory Visit: Payer: Self-pay | Admitting: Cardiology

## 2018-11-15 DIAGNOSIS — H5203 Hypermetropia, bilateral: Secondary | ICD-10-CM | POA: Diagnosis not present

## 2018-11-15 DIAGNOSIS — H43813 Vitreous degeneration, bilateral: Secondary | ICD-10-CM | POA: Diagnosis not present

## 2018-11-15 DIAGNOSIS — H0100A Unspecified blepharitis right eye, upper and lower eyelids: Secondary | ICD-10-CM | POA: Diagnosis not present

## 2018-11-15 DIAGNOSIS — H04123 Dry eye syndrome of bilateral lacrimal glands: Secondary | ICD-10-CM | POA: Diagnosis not present

## 2018-11-18 ENCOUNTER — Other Ambulatory Visit: Payer: Self-pay | Admitting: Pharmacist

## 2018-11-18 MED ORDER — APIXABAN 5 MG PO TABS: 5.0000 mg | ORAL_TABLET | Freq: Two times a day (BID) | ORAL | 3 refills | Status: DC

## 2018-11-18 NOTE — Telephone Encounter (Signed)
73 yo, 78.9kg, Scr 0.68 on 08/18/18 Last OV on 08/18/18 Indication afib

## 2018-11-30 ENCOUNTER — Other Ambulatory Visit: Payer: Self-pay | Admitting: Cardiology

## 2018-11-30 MED ORDER — FLECAINIDE ACETATE 100 MG PO TABS: 100.0000 mg | ORAL_TABLET | Freq: Two times a day (BID) | ORAL | 2 refills | Status: DC

## 2018-11-30 NOTE — Telephone Encounter (Signed)
Pt's medication was sent to pt's pharmacy as requested. Confirmation received.  °

## 2018-12-06 DIAGNOSIS — G4733 Obstructive sleep apnea (adult) (pediatric): Secondary | ICD-10-CM | POA: Diagnosis not present

## 2018-12-14 DIAGNOSIS — H04123 Dry eye syndrome of bilateral lacrimal glands: Secondary | ICD-10-CM | POA: Diagnosis not present

## 2018-12-19 DIAGNOSIS — H04123 Dry eye syndrome of bilateral lacrimal glands: Secondary | ICD-10-CM | POA: Diagnosis not present

## 2018-12-19 DIAGNOSIS — H0100B Unspecified blepharitis left eye, upper and lower eyelids: Secondary | ICD-10-CM | POA: Diagnosis not present

## 2018-12-19 DIAGNOSIS — H0100A Unspecified blepharitis right eye, upper and lower eyelids: Secondary | ICD-10-CM | POA: Diagnosis not present

## 2018-12-26 DIAGNOSIS — Z20828 Contact with and (suspected) exposure to other viral communicable diseases: Secondary | ICD-10-CM | POA: Diagnosis not present

## 2019-01-05 DIAGNOSIS — G4733 Obstructive sleep apnea (adult) (pediatric): Secondary | ICD-10-CM | POA: Diagnosis not present

## 2019-01-30 DIAGNOSIS — G4733 Obstructive sleep apnea (adult) (pediatric): Secondary | ICD-10-CM | POA: Diagnosis not present

## 2019-02-05 DIAGNOSIS — G4733 Obstructive sleep apnea (adult) (pediatric): Secondary | ICD-10-CM | POA: Diagnosis not present

## 2019-02-08 DIAGNOSIS — L720 Epidermal cyst: Secondary | ICD-10-CM | POA: Diagnosis not present

## 2019-02-08 DIAGNOSIS — L820 Inflamed seborrheic keratosis: Secondary | ICD-10-CM | POA: Diagnosis not present

## 2019-02-08 DIAGNOSIS — Z08 Encounter for follow-up examination after completed treatment for malignant neoplasm: Secondary | ICD-10-CM | POA: Diagnosis not present

## 2019-02-08 DIAGNOSIS — L821 Other seborrheic keratosis: Secondary | ICD-10-CM | POA: Diagnosis not present

## 2019-02-08 DIAGNOSIS — Z85828 Personal history of other malignant neoplasm of skin: Secondary | ICD-10-CM | POA: Diagnosis not present

## 2019-02-15 DIAGNOSIS — M898X7 Other specified disorders of bone, ankle and foot: Secondary | ICD-10-CM | POA: Diagnosis not present

## 2019-02-15 DIAGNOSIS — M7752 Other enthesopathy of left foot: Secondary | ICD-10-CM | POA: Diagnosis not present

## 2019-02-15 DIAGNOSIS — M7662 Achilles tendinitis, left leg: Secondary | ICD-10-CM | POA: Diagnosis not present

## 2019-03-02 DIAGNOSIS — G4733 Obstructive sleep apnea (adult) (pediatric): Secondary | ICD-10-CM | POA: Diagnosis not present

## 2019-03-08 DIAGNOSIS — G4733 Obstructive sleep apnea (adult) (pediatric): Secondary | ICD-10-CM | POA: Diagnosis not present

## 2019-03-13 DIAGNOSIS — M25562 Pain in left knee: Secondary | ICD-10-CM | POA: Diagnosis not present

## 2019-03-13 DIAGNOSIS — I4891 Unspecified atrial fibrillation: Secondary | ICD-10-CM | POA: Diagnosis not present

## 2019-03-13 DIAGNOSIS — M25552 Pain in left hip: Secondary | ICD-10-CM | POA: Diagnosis not present

## 2019-03-22 DIAGNOSIS — M79605 Pain in left leg: Secondary | ICD-10-CM | POA: Diagnosis not present

## 2019-03-23 DIAGNOSIS — M25562 Pain in left knee: Secondary | ICD-10-CM | POA: Diagnosis not present

## 2019-03-23 DIAGNOSIS — R262 Difficulty in walking, not elsewhere classified: Secondary | ICD-10-CM | POA: Diagnosis not present

## 2019-03-23 DIAGNOSIS — Z789 Other specified health status: Secondary | ICD-10-CM | POA: Diagnosis not present

## 2019-03-23 DIAGNOSIS — M25559 Pain in unspecified hip: Secondary | ICD-10-CM | POA: Diagnosis not present

## 2019-03-23 DIAGNOSIS — M7662 Achilles tendinitis, left leg: Secondary | ICD-10-CM | POA: Diagnosis not present

## 2019-03-23 DIAGNOSIS — Z7409 Other reduced mobility: Secondary | ICD-10-CM | POA: Diagnosis not present

## 2019-03-29 DIAGNOSIS — M7662 Achilles tendinitis, left leg: Secondary | ICD-10-CM | POA: Diagnosis not present

## 2019-03-29 DIAGNOSIS — R262 Difficulty in walking, not elsewhere classified: Secondary | ICD-10-CM | POA: Diagnosis not present

## 2019-03-29 DIAGNOSIS — M25562 Pain in left knee: Secondary | ICD-10-CM | POA: Diagnosis not present

## 2019-03-29 DIAGNOSIS — M79605 Pain in left leg: Secondary | ICD-10-CM | POA: Diagnosis not present

## 2019-03-29 DIAGNOSIS — M25559 Pain in unspecified hip: Secondary | ICD-10-CM | POA: Diagnosis not present

## 2019-03-29 DIAGNOSIS — Z789 Other specified health status: Secondary | ICD-10-CM | POA: Diagnosis not present

## 2019-03-29 DIAGNOSIS — Z7409 Other reduced mobility: Secondary | ICD-10-CM | POA: Diagnosis not present

## 2019-04-01 DIAGNOSIS — G4733 Obstructive sleep apnea (adult) (pediatric): Secondary | ICD-10-CM | POA: Diagnosis not present

## 2019-04-05 DIAGNOSIS — M25559 Pain in unspecified hip: Secondary | ICD-10-CM | POA: Diagnosis not present

## 2019-04-05 DIAGNOSIS — M25562 Pain in left knee: Secondary | ICD-10-CM | POA: Diagnosis not present

## 2019-04-05 DIAGNOSIS — Z7409 Other reduced mobility: Secondary | ICD-10-CM | POA: Diagnosis not present

## 2019-04-05 DIAGNOSIS — M7662 Achilles tendinitis, left leg: Secondary | ICD-10-CM | POA: Diagnosis not present

## 2019-04-05 DIAGNOSIS — R262 Difficulty in walking, not elsewhere classified: Secondary | ICD-10-CM | POA: Diagnosis not present

## 2019-04-05 DIAGNOSIS — Z789 Other specified health status: Secondary | ICD-10-CM | POA: Diagnosis not present

## 2019-04-05 DIAGNOSIS — M79605 Pain in left leg: Secondary | ICD-10-CM | POA: Diagnosis not present

## 2019-04-07 DIAGNOSIS — G4733 Obstructive sleep apnea (adult) (pediatric): Secondary | ICD-10-CM | POA: Diagnosis not present

## 2019-04-10 DIAGNOSIS — M25562 Pain in left knee: Secondary | ICD-10-CM | POA: Diagnosis not present

## 2019-04-10 DIAGNOSIS — M7662 Achilles tendinitis, left leg: Secondary | ICD-10-CM | POA: Diagnosis not present

## 2019-04-10 DIAGNOSIS — R262 Difficulty in walking, not elsewhere classified: Secondary | ICD-10-CM | POA: Diagnosis not present

## 2019-04-10 DIAGNOSIS — Z789 Other specified health status: Secondary | ICD-10-CM | POA: Diagnosis not present

## 2019-04-10 DIAGNOSIS — M79605 Pain in left leg: Secondary | ICD-10-CM | POA: Diagnosis not present

## 2019-04-10 DIAGNOSIS — M25559 Pain in unspecified hip: Secondary | ICD-10-CM | POA: Diagnosis not present

## 2019-04-10 DIAGNOSIS — Z7409 Other reduced mobility: Secondary | ICD-10-CM | POA: Diagnosis not present

## 2019-04-11 DIAGNOSIS — Z124 Encounter for screening for malignant neoplasm of cervix: Secondary | ICD-10-CM | POA: Diagnosis not present

## 2019-04-11 DIAGNOSIS — Z6831 Body mass index (BMI) 31.0-31.9, adult: Secondary | ICD-10-CM | POA: Diagnosis not present

## 2019-04-12 DIAGNOSIS — Z124 Encounter for screening for malignant neoplasm of cervix: Secondary | ICD-10-CM | POA: Diagnosis not present

## 2019-04-17 DIAGNOSIS — Z789 Other specified health status: Secondary | ICD-10-CM | POA: Diagnosis not present

## 2019-04-17 DIAGNOSIS — M7662 Achilles tendinitis, left leg: Secondary | ICD-10-CM | POA: Diagnosis not present

## 2019-04-17 DIAGNOSIS — Z7409 Other reduced mobility: Secondary | ICD-10-CM | POA: Diagnosis not present

## 2019-04-17 DIAGNOSIS — M25562 Pain in left knee: Secondary | ICD-10-CM | POA: Diagnosis not present

## 2019-04-17 DIAGNOSIS — M25559 Pain in unspecified hip: Secondary | ICD-10-CM | POA: Diagnosis not present

## 2019-04-17 DIAGNOSIS — R262 Difficulty in walking, not elsewhere classified: Secondary | ICD-10-CM | POA: Diagnosis not present

## 2019-04-17 DIAGNOSIS — M79605 Pain in left leg: Secondary | ICD-10-CM | POA: Diagnosis not present

## 2019-04-19 DIAGNOSIS — G8929 Other chronic pain: Secondary | ICD-10-CM | POA: Diagnosis not present

## 2019-04-19 DIAGNOSIS — M25561 Pain in right knee: Secondary | ICD-10-CM | POA: Diagnosis not present

## 2019-04-19 DIAGNOSIS — M7061 Trochanteric bursitis, right hip: Secondary | ICD-10-CM | POA: Diagnosis not present

## 2019-04-20 DIAGNOSIS — M7061 Trochanteric bursitis, right hip: Secondary | ICD-10-CM | POA: Diagnosis not present

## 2019-04-20 DIAGNOSIS — M25561 Pain in right knee: Secondary | ICD-10-CM | POA: Diagnosis not present

## 2019-04-20 DIAGNOSIS — G8929 Other chronic pain: Secondary | ICD-10-CM | POA: Diagnosis not present

## 2019-05-01 DIAGNOSIS — G4733 Obstructive sleep apnea (adult) (pediatric): Secondary | ICD-10-CM | POA: Diagnosis not present

## 2019-05-08 DIAGNOSIS — G4733 Obstructive sleep apnea (adult) (pediatric): Secondary | ICD-10-CM | POA: Diagnosis not present

## 2019-05-09 DIAGNOSIS — M8589 Other specified disorders of bone density and structure, multiple sites: Secondary | ICD-10-CM | POA: Diagnosis not present

## 2019-05-24 ENCOUNTER — Telehealth: Payer: Self-pay | Admitting: Cardiology

## 2019-05-24 NOTE — Telephone Encounter (Signed)
Called pt and pt stated that she has ordered her medication Eliquis and that it is behind in the mail because of the holidays. I advised the pt that I will leave 1 box of Eliquis a week supply downstairs for pt to pick up at the office and if pt has any other problems, questions or concerns, to call the office. Pt verbalized understanding.

## 2019-05-24 NOTE — Telephone Encounter (Signed)
Patient calling the office for samples of medication: ° ° °1.  What medication and dosage are you requesting samples for? ° °apixaban (ELIQUIS) 5 MG TABS tablet ° °2.  Are you currently out of this medication?  yes ° ° °

## 2019-05-30 DIAGNOSIS — M9903 Segmental and somatic dysfunction of lumbar region: Secondary | ICD-10-CM | POA: Diagnosis not present

## 2019-05-30 DIAGNOSIS — M25559 Pain in unspecified hip: Secondary | ICD-10-CM | POA: Diagnosis not present

## 2019-05-30 DIAGNOSIS — M545 Low back pain: Secondary | ICD-10-CM | POA: Diagnosis not present

## 2019-05-31 DIAGNOSIS — G4733 Obstructive sleep apnea (adult) (pediatric): Secondary | ICD-10-CM | POA: Diagnosis not present

## 2019-06-07 DIAGNOSIS — G4733 Obstructive sleep apnea (adult) (pediatric): Secondary | ICD-10-CM | POA: Diagnosis not present

## 2019-06-07 DIAGNOSIS — M9903 Segmental and somatic dysfunction of lumbar region: Secondary | ICD-10-CM | POA: Diagnosis not present

## 2019-06-07 DIAGNOSIS — M25559 Pain in unspecified hip: Secondary | ICD-10-CM | POA: Diagnosis not present

## 2019-06-07 DIAGNOSIS — M545 Low back pain: Secondary | ICD-10-CM | POA: Diagnosis not present

## 2019-07-03 DIAGNOSIS — L570 Actinic keratosis: Secondary | ICD-10-CM | POA: Diagnosis not present

## 2019-07-03 DIAGNOSIS — L821 Other seborrheic keratosis: Secondary | ICD-10-CM | POA: Diagnosis not present

## 2019-07-03 DIAGNOSIS — L271 Localized skin eruption due to drugs and medicaments taken internally: Secondary | ICD-10-CM | POA: Diagnosis not present

## 2019-07-03 DIAGNOSIS — D485 Neoplasm of uncertain behavior of skin: Secondary | ICD-10-CM | POA: Diagnosis not present

## 2019-07-03 DIAGNOSIS — Z08 Encounter for follow-up examination after completed treatment for malignant neoplasm: Secondary | ICD-10-CM | POA: Diagnosis not present

## 2019-07-03 DIAGNOSIS — Z85828 Personal history of other malignant neoplasm of skin: Secondary | ICD-10-CM | POA: Diagnosis not present

## 2019-07-06 DIAGNOSIS — R238 Other skin changes: Secondary | ICD-10-CM | POA: Diagnosis not present

## 2019-07-06 DIAGNOSIS — L989 Disorder of the skin and subcutaneous tissue, unspecified: Secondary | ICD-10-CM | POA: Diagnosis not present

## 2019-07-13 ENCOUNTER — Other Ambulatory Visit: Payer: Self-pay | Admitting: Cardiology

## 2019-08-07 DIAGNOSIS — R69 Illness, unspecified: Secondary | ICD-10-CM | POA: Diagnosis not present

## 2019-08-11 ENCOUNTER — Telehealth: Payer: Self-pay | Admitting: *Deleted

## 2019-08-11 NOTE — Telephone Encounter (Signed)

## 2019-08-17 NOTE — Progress Notes (Signed)
Virtual Visit via Telephone Note   This visit type was conducted due to national recommendations for restrictions regarding the COVID-19 Pandemic (e.g. social distancing) in an effort to limit this patient's exposure and mitigate transmission in our community.  Due to her co-morbid illnesses, this patient is at least at moderate risk for complications without adequate follow up.  This format is felt to be most appropriate for this patient at this time.  All issues noted in this document were discussed and addressed.  A limited physical exam was performed with this format.  Please refer to the patient's chart for her consent to telehealth for [facility].   Evaluation Performed:  Follow-up visit  This visit type was conducted due to national recommendations for restrictions regarding the COVID-19 Pandemic (e.g. social distancing).  This format is felt to be most appropriate for this patient at this time.  All issues noted in this document were discussed and addressed.  No physical exam was performed (except for noted visual exam findings with Video Visits).  Please refer to the patient's chart (MyChart message for video visits and phone note for telephone visits) for the patient's consent to telehealth for [facility].  Date:  08/18/2019   ID:  Erin Alvarez, DOB 1945/12/17, MRN [id number]  Patient Location:  Home  Provider location:   [place]  PCP:  [name], MD  Cardiologist:  [name], MD  Electrophysiologist:  None   Chief Complaint:  PAF, OSA  History of Present Illness:    Erin Alvarez is a 74 y.o. female who presents via audio/video conferencing for a telehealth visit today.    Erin Alvarez is a 74 y.o. female with a hx of atypical CPand subsequentstress echo with no ischemia,rheumatic fever as a child,grade 2 diastolic dysfunction by echo and persistent atrial fibrillation (first diagnosed in2013)and has been on Eliquis for a CHADS2Vasc score of 3,  Flecainide and Cardizem. She has severe OSA with an initial AHI of 29/hr on her sleep study and is on CPAP.   She is here today for followup and is doing well.  She denies any chest pain or pressure,  DOE, PND, orthopnea,  dizziness, palpitations or syncope. She has chronic LE edema.  Once in a while she will have a brief episode of SOB that last a few seconds and resolves.  She is compliant with her meds and is tolerating meds with no SE.    She is doing well with her CPAP device and thinks that she has gotten used to it.  She tolerates the mask and feels the pressure is adequate.  Since going on CPAP she feels rested in the am and has no significant daytime sleepiness although if she sits down after lunch she may nap.  She denies any significant nasal dryness or nasal congestion.  She does have problems with mouth dryness and mouth breathes some. She wears the chin strap occasionally. She says that her husband says that occasionally he will hear a blowing noise during sleep.   The patient does not have symptoms concerning for COVID-19 infection (fever, chills, cough, or new shortness of breath).   Prior CV studies:   The following studies were reviewed today:  PAP compliance download, EKG, outside labs from PCP on [facility]  Past Medical History:  Diagnosis Date  . Allergic rhinitis, cause unspecified   . Disorder of bone and cartilage, unspecified   . Diverticulosis of colon (without mention of hemorrhage)   . Elbow  fracture, right 12/12  . Essential hypertension, benign   . Family history of malignant neoplasm of breast   . H/O: rheumatic fever   . Heart murmur    dx as child - s/p rheumatic fever  . Leukocytopenia, unspecified   . Lung nodule 06/2009   CT of chest, 4 mm subpleural nodule in the superior segment of the right lower lobe  . Mild mitral regurgitation   . Mild tricuspid regurgitation   . OSA (obstructive sleep apnea)    Severe w AHI 29/hr and successful CPAP now on auto CPAP   . Osteopenia   . Persistent atrial fibrillation 05/04/2012   on systemic anticoagulation w Xarelto for CHADS VASC score of 3  . Pneumonia, organism unspecified(486)   . Pulmonary HTN ([place])    resolved on echo 10/2015  . Sleep apnea    cpap  . Unspecified sinusitis (chronic)    Past Surgical History:  Procedure Laterality Date  . BREAST BIOPSY Left    biopsy - benign  . DILATION AND CURETTAGE OF UTERUS    . FOOT SURGERY Left   . TONSILLECTOMY    . TUBAL LIGATION       Current Meds  Medication Sig  . apixaban (ELIQUIS) 5 MG TABS tablet Take 1 tablet (5 mg total) by mouth 2 (two) times daily.  . cetirizine (ZYRTEC) 10 MG tablet Take 10 mg by mouth daily as needed. For allergies  . diltiazem (CARDIZEM CD) 120 MG 24 hr capsule Take 1 capsule (120 mg total) by mouth daily. Please make yearly appt with Dr. [name] for February before anymore refills. 1st attempt  . estradiol (ESTRACE) 0.1 MG/GM vaginal cream Place 1 Applicatorful vaginally once a week.   . flecainide (TAMBOCOR) 100 MG tablet Take 1 tablet (100 mg total) by mouth 2 (two) times daily. Please make yearly appt with Dr. [name] for February before anymore refills. 1st attempt  . furosemide (LASIX) 20 MG tablet Take 1 tablet (20 mg total) by mouth daily as needed for edema.  . hydrochlorothiazide (MICROZIDE) 12.5 MG capsule Take 1 capsule (12.5 mg total) by mouth daily. Take with losartan 50mg   . hydrocortisone 2.5 % cream Apply 1 application topically 2 (two) times daily. Apply to lower lip BID  . losartan (COZAAR) 50 MG tablet Take 1 tablet (50 mg total) by mouth daily. Take with HCTZ 12.5mg   . Magnesium 400 MG CAPS Take 400 mg by mouth daily.  . mometasone (NASONEX) 50 MCG/ACT nasal spray Place 2 sprays into the nose daily as needed (allergies).   . Multiple Vitamins-Minerals (CENTRUM PO) Take 1 tablet by mouth daily.    . NON FORMULARY CPAP at bedtime  . Omega-3 Fatty Acids (FISH OIL) 1200 MG CAPS Take 2 capsules by mouth  daily.     Current Facility-Administered Medications for the 08/18/19 encounter (Telemedicine) with Sueanne Margarita, MD  Medication  . 0.9 %  sodium chloride infusion     Allergies:   Ciprofloxacin and Naproxen   Social History   Tobacco Use  . Smoking status: Former Smoker    Packs/day: 2.00    Years: 15.00    Pack years: 30.00    Quit date: 06/22/1974    Years since quitting: 45.1  . Smokeless tobacco: Never Used  Substance Use Topics  . Alcohol use: Yes    Alcohol/week: 0.0 standard drinks    Comment: rare wine  . Drug use: No     Family Hx: The patient's family history  includes Brain cancer in her mother; Emphysema in her father; Heart disease (age of onset: 41) in her mother; Hyperlipidemia in her brother and another family member; Hypertension in her brother, father, and another family member; Lung cancer in her mother.  ROS:   Please see the history of present illness.     All other systems reviewed and are negative.   Labs/Other Tests and Data Reviewed:    Recent Labs: 08/18/2018: BUN 14; Creatinine, Ser 0.65; Hemoglobin 14.3; Platelets 250; Potassium 4.5; Sodium 142   Recent Lipid Panel Lab Results  Component Value Date/Time   CHOL 169 07/03/2010 08:29 PM   TRIG 52 07/03/2010 08:29 PM   HDL 72 07/03/2010 08:29 PM   CHOLHDL 2.3 Ratio 07/03/2010 08:29 PM   LDLCALC 87 07/03/2010 08:29 PM    Wt Readings from Last 3 Encounters:  08/18/19 184 lb (83.5 kg)  08/18/18 174 lb (78.9 kg)  08/13/17 186 lb 1.9 oz (84.4 kg)     Objective:    Vital Signs:  BP (!) 129/58   Pulse 62   Ht 5\' 5"  (1.651 m)   Wt 184 lb (83.5 kg)   BMI 30.62 kg/m     ASSESSMENT & PLAN:    1.  Persistent atrial fibrillation -she is maintaining NSR on exam -denies any bleeding problems on Eliquis -continue Eliquis 5mg  BID -outside labs reviewed from PCP on [facility] and showed creatinine was 0.8 in 10/2018 and K+ 4.5 in 2.2020 -repeat BMET and CBC  2.  HTN -Bp controlled -continue  Losartan 50mg  daily, HCTZ  12.5mg  daily and Cardizem CD 120mg  daily - refilled today  3.  Pulmonary HTN -this was resolved on last 2D echo  4.  OSA -  The PAP download was reviewed today and showed an AHI of 3.9/hr on 8 cm H2O with 100% compliance in using more than 4 hours nightly.  The patient has been using and benefiting from PAP use and will continue to benefit from therapy. Encouraged her to wear her chin strap more.   COVID-19 Education: The signs and symptoms of COVID-19 were discussed with the patient and how to seek care for testing (follow up with PCP or arrange E-visit).  The importance of social distancing was discussed today.  Patient Risk:   After full review of this patient's clinical status, I feel that they are at least moderate risk at this time.  Time:   Today, I have spent 20 minutes on telemedicine discussing medical problems including OSA, PAF< HTN, obesity and reviewing patient's chart including PAP compliance download and labs from [facility].  Medication Adjustments/Labs and Tests Ordered: Current medicines are reviewed at length with the patient today.  Concerns regarding medicines are outlined above.  Tests Ordered: No orders of the defined types were placed in this encounter.  Medication Changes: No orders of the defined types were placed in this encounter.   Disposition:  Follow up in 1 year(s)  Signed, [name], MD  08/18/2019 9:48 AM    [facility]

## 2019-08-18 ENCOUNTER — Telehealth (INDEPENDENT_AMBULATORY_CARE_PROVIDER_SITE_OTHER): Payer: Medicare HMO | Admitting: Cardiology

## 2019-08-18 ENCOUNTER — Other Ambulatory Visit: Payer: Self-pay

## 2019-08-18 VITALS — BP 129/58 | HR 62 | Ht 65.0 in | Wt 184.0 lb

## 2019-08-18 DIAGNOSIS — I10 Essential (primary) hypertension: Secondary | ICD-10-CM

## 2019-08-18 DIAGNOSIS — G4733 Obstructive sleep apnea (adult) (pediatric): Secondary | ICD-10-CM

## 2019-08-18 DIAGNOSIS — I272 Pulmonary hypertension, unspecified: Secondary | ICD-10-CM | POA: Diagnosis not present

## 2019-08-18 DIAGNOSIS — Z9989 Dependence on other enabling machines and devices: Secondary | ICD-10-CM

## 2019-08-18 DIAGNOSIS — I4819 Other persistent atrial fibrillation: Secondary | ICD-10-CM

## 2019-08-18 MED ORDER — LOSARTAN POTASSIUM-HCTZ 50-12.5 MG PO TABS: 1.0000 | ORAL_TABLET | Freq: Every day | ORAL | 3 refills | Status: DC

## 2019-08-18 MED ORDER — DILTIAZEM HCL ER COATED BEADS 120 MG PO CP24: 120.0000 mg | ORAL_CAPSULE | Freq: Every day | ORAL | 3 refills | Status: DC

## 2019-08-18 MED ORDER — APIXABAN 5 MG PO TABS: 5.0000 mg | ORAL_TABLET | Freq: Two times a day (BID) | ORAL | 3 refills | Status: DC

## 2019-08-18 NOTE — Patient Instructions (Addendum)
Medication Instructions:  Your physician has recommended you make the following change in your medication:  1) Start taking Losartan-HCTZ 50-12.5mg  daily as a combination pill 2) Discontinue separate Losartan and hydrochlorothiazide pills.  *If you need a refill on your cardiac medications before your next appointment, please call your pharmacy*   Lab Work: BMET and CBC If you have labs (blood work) drawn today and your tests are completely normal, you will receive your results only by: Marland Kitchen MyChart Message (if you have MyChart) OR . A paper copy in the mail If you have any lab test that is abnormal or we need to change your treatment, we will call you to review the results.   Follow-Up: At [facility], you and your health needs are our priority.  As part of our continuing mission to provide you with exceptional heart care, we have created designated Provider Care Teams.  These Care Teams include your primary Cardiologist (physician) and Advanced Practice Providers (APPs -  Physician Assistants and Nurse Practitioners) who all work together to provide you with the care you need, when you need it.  Your next appointment:   1 year(s)  The format for your next appointment:   Either In Person or Virtual  Provider:   [name], MD

## 2019-08-23 ENCOUNTER — Other Ambulatory Visit: Payer: Self-pay

## 2019-08-23 ENCOUNTER — Other Ambulatory Visit: Payer: Medicare HMO | Admitting: *Deleted

## 2019-08-23 DIAGNOSIS — I272 Pulmonary hypertension, unspecified: Secondary | ICD-10-CM

## 2019-08-23 DIAGNOSIS — I10 Essential (primary) hypertension: Secondary | ICD-10-CM | POA: Diagnosis not present

## 2019-08-23 DIAGNOSIS — Z9989 Dependence on other enabling machines and devices: Secondary | ICD-10-CM

## 2019-08-23 DIAGNOSIS — G4733 Obstructive sleep apnea (adult) (pediatric): Secondary | ICD-10-CM

## 2019-08-23 DIAGNOSIS — I4819 Other persistent atrial fibrillation: Secondary | ICD-10-CM | POA: Diagnosis not present

## 2019-08-23 LAB — BASIC METABOLIC PANEL
BUN/Creatinine Ratio: 15 (ref 12–28)
BUN: 11 mg/dL (ref 8–27)
CO2: 25 mmol/L (ref 20–29)
Calcium: 9.4 mg/dL (ref 8.7–10.3)
Chloride: 101 mmol/L (ref 96–106)
Creatinine, Ser: 0.75 mg/dL (ref 0.57–1.00)
GFR calc Af Amer: 90 mL/min/{1.73_m2} (ref >59)
GFR calc non Af Amer: 78 mL/min/{1.73_m2} (ref >59)
Glucose: 86 mg/dL (ref 65–99)
Potassium: 4.2 mmol/L (ref 3.5–5.2)
Sodium: 142 mmol/L (ref 134–144)

## 2019-08-23 LAB — CBC
Hematocrit: 43 % (ref 34.0–46.6)
Hemoglobin: 14.7 g/dL (ref 11.1–15.9)
MCH: 30.1 pg (ref 26.6–33.0)
MCHC: 34.2 g/dL (ref 31.5–35.7)
MCV: 88 fL (ref 79–97)
Platelets: 236 10*3/uL (ref 150–450)
RBC: 4.89 x10E6/uL (ref 3.77–5.28)
RDW: 11.5 % — ABNORMAL LOW (ref 11.7–15.4)
WBC: 5.4 10*3/uL (ref 3.4–10.8)

## 2019-08-31 DIAGNOSIS — R69 Illness, unspecified: Secondary | ICD-10-CM | POA: Diagnosis not present

## 2019-10-05 DIAGNOSIS — G4733 Obstructive sleep apnea (adult) (pediatric): Secondary | ICD-10-CM | POA: Diagnosis not present

## 2019-10-19 ENCOUNTER — Telehealth: Payer: Self-pay | Admitting: Cardiology

## 2019-10-19 MED ORDER — FUROSEMIDE 20 MG PO TABS: 20.0000 mg | ORAL_TABLET | Freq: Every day | ORAL | 3 refills | Status: DC | PRN

## 2019-10-19 NOTE — Telephone Encounter (Signed)
Pt's medication was sent to pt's pharmacy as requested. Confirmation received.  °

## 2019-10-19 NOTE — Telephone Encounter (Signed)
Spoke with the patient who states that she's had swelling in her legs and feet for the past week. She is unsure if she has gained any weight. She denies SOB, chest pain, or dizziness. She states that it goes down some at night and then comes back through out the day. She sates that yesterday she took 40 mg Lasix and did not notice much improvement. She states that she has not taken any today. She has been trying to wear her compression hose which help some but has not been elevating her feet much. Patient reports blood pressure this morning prior to taking medications was 147/72. Advised patient to take Lasix again today, avoid sodium in her diet and elevate her legs and will let us know if still no improvement.

## 2019-10-19 NOTE — Telephone Encounter (Signed)
Follow up  ° ° °Patient is returning call.  °

## 2019-10-19 NOTE — Telephone Encounter (Signed)
° °  Pt c/o swelling: STAT is pt has developed SOB within 24 hours  1) How much weight have you gained and in what time span? Not sure  2) If swelling, where is the swelling located? Both, right is worst. From feet to legs  3) Are you currently taking a fluid pill? Took 2 yesterday  4) Are you currently SOB? No  5) Do you have a log of your daily weights (if so, list)? Don't have  6) Have you gained 3 pounds in a day or 5 pounds in a week? No  7) Have you traveled recently? No  Pt said her legs getting worst for the last week. She said, no SOB and not sure if she gained weights  Please call

## 2019-10-19 NOTE — Telephone Encounter (Signed)
Pt is requesting a refill on furosemide. This medication has not been refilled since 2018. Would Dr. [name] like to refill this medication? Please address

## 2019-10-19 NOTE — Telephone Encounter (Signed)
She may be back in afib.  Please have her come in for nurse visit tomorrow for EKG.  If no appt available then get her into afib clinic tomorrow.  Please have her take Lasix 40mg  daily for 3 days and then back to PRN

## 2019-10-19 NOTE — Telephone Encounter (Signed)
Patient calling because she had requested a refill on her Lasix to take as needed. Confirmed that a refill was sent in to her pharmacy. She will call us back if swelling does not improve.

## 2019-10-19 NOTE — Telephone Encounter (Signed)
Left message for patient to call back  

## 2019-10-19 NOTE — Telephone Encounter (Signed)
Spoke with patient and advised to continue Lasix 40 mg for 3 days and let us know if swelling does not improve.  Advised her that Dr. [name] would like for her to come into the office tomorrow for an EKG to make sure she is not back in A Fib. The patient stated that she is unable to come in tomorrow and that she knows she is not in A Fib and she is feeling fine.

## 2019-10-19 NOTE — Telephone Encounter (Signed)
*  STAT* If patient is at the pharmacy, call can be transferred to refill team.   1. Which medications need to be refilled? (please list name of each medication and dose if known) furosemide (LASIX) 20 MG tablet  2. Which pharmacy/location (including street and city if local pharmacy) is medication to be sent to? [facility], [place], [place] [address]   3. Do they need a 30 day or 90 day supply? 90 day supply

## 2019-10-20 NOTE — Telephone Encounter (Signed)
Please let her know that the only sx of afib can be fluid retention and since her RedsVest still confirms volume overload this may be the only symptom of afib and if she is in afib we will have a hard time getting fluid off if this is not addressed

## 2019-10-23 NOTE — Telephone Encounter (Signed)
Left message for patient to call back  

## 2019-10-26 DIAGNOSIS — L570 Actinic keratosis: Secondary | ICD-10-CM | POA: Diagnosis not present

## 2019-10-26 DIAGNOSIS — L408 Other psoriasis: Secondary | ICD-10-CM | POA: Diagnosis not present

## 2019-10-26 DIAGNOSIS — L821 Other seborrheic keratosis: Secondary | ICD-10-CM | POA: Diagnosis not present

## 2019-10-26 DIAGNOSIS — L820 Inflamed seborrheic keratosis: Secondary | ICD-10-CM | POA: Diagnosis not present

## 2019-10-27 NOTE — Telephone Encounter (Signed)
Spoke with the patient who states that she is still having swelling in her legs. She took Lasix for three days and it would help some but then come back. She took another Lasix yesterday. Advised that she will have trouble getting fluid off if she is in A fib. She does not think that she is in A fib. Patient has an appointment with Dr. [name] 05/11.

## 2019-10-31 ENCOUNTER — Other Ambulatory Visit: Payer: Self-pay | Admitting: Cardiology

## 2019-10-31 ENCOUNTER — Other Ambulatory Visit: Payer: Self-pay

## 2019-10-31 ENCOUNTER — Encounter: Payer: Self-pay | Admitting: Cardiology

## 2019-10-31 ENCOUNTER — Ambulatory Visit: Payer: Medicare HMO | Admitting: Cardiology

## 2019-10-31 VITALS — BP 140/88 | HR 52 | Ht 65.0 in | Wt 191.8 lb

## 2019-10-31 DIAGNOSIS — I272 Pulmonary hypertension, unspecified: Secondary | ICD-10-CM | POA: Diagnosis not present

## 2019-10-31 DIAGNOSIS — I4819 Other persistent atrial fibrillation: Secondary | ICD-10-CM

## 2019-10-31 DIAGNOSIS — R600 Localized edema: Secondary | ICD-10-CM | POA: Diagnosis not present

## 2019-10-31 DIAGNOSIS — I10 Essential (primary) hypertension: Secondary | ICD-10-CM | POA: Diagnosis not present

## 2019-10-31 MED ORDER — LOSARTAN POTASSIUM 50 MG PO TABS
50.0000 mg | ORAL_TABLET | Freq: Every day | ORAL | 3 refills | Status: DC
Start: 2019-10-31 — End: 2020-12-24

## 2019-10-31 NOTE — Patient Instructions (Signed)
Medication Instructions:  Your physician has recommended you make the following change in your medication:  1) STOP taking Hyzaar 2) START taking Losartan 50 mg daily  *If you need a refill on your cardiac medications before your next appointment, please call your pharmacy*   Follow-Up: At [facility] you and your health needs are our priority.  As part of our continuing mission to provide you with exceptional heart care, we have created designated Provider Care Teams.  These Care Teams include your primary Cardiologist (physician) and Advanced Practice Providers (APPs -  Physician Assistants and Nurse Practitioners) who all work together to provide you with the care you need, when you need it.  Your next appointment:   1 year(s)  The format for your next appointment:   In Person  Provider:   You may see [name], MD or one of the following Advanced Practice Providers on your designated Care Team:    Melina Copa, PA-C  Ermalinda Barrios, PA-C    Other Instructions  Low-Sodium Eating Plan Sodium, which is an element that makes up salt, helps you maintain a healthy balance of fluids in your body. Too much sodium can increase your blood pressure and cause fluid and waste to be held in your body. Your health care provider or dietitian may recommend following this plan if you have high blood pressure (hypertension), kidney disease, liver disease, or heart failure. Eating less sodium can help lower your blood pressure, reduce swelling, and protect your heart, liver, and kidneys. What are tips for following this plan? General guidelines  Most people on this plan should limit their sodium intake to 1,500-2,000 mg (milligrams) of sodium each day. Reading food labels   The Nutrition Facts label lists the amount of sodium in one serving of the food. If you eat more than one serving, you must multiply the listed amount of sodium by the number of servings.  Choose foods with less than  140 mg of sodium per serving.  Avoid foods with 300 mg of sodium or more per serving. Shopping  Look for lower-sodium products, often labeled as "low-sodium" or "no salt added."  Always check the sodium content even if foods are labeled as "unsalted" or "no salt added".  Buy fresh foods. ? Avoid canned foods and premade or frozen meals. ? Avoid canned, cured, or processed meats  Buy breads that have less than 80 mg of sodium per slice. Cooking  Eat more home-cooked food and less restaurant, buffet, and fast food.  Avoid adding salt when cooking. Use salt-free seasonings or herbs instead of table salt or sea salt. Check with your health care provider or pharmacist before using salt substitutes.  Cook with plant-based oils, such as canola, sunflower, or olive oil. Meal planning  When eating at a restaurant, ask that your food be prepared with less salt or no salt, if possible.  Avoid foods that contain MSG (monosodium glutamate). MSG is sometimes added to [place] food, bouillon, and some canned foods. What foods are recommended? The items listed may not be a complete list. Talk with your dietitian about what dietary choices are best for you. Grains Low-sodium cereals, including oats, puffed wheat and rice, and shredded wheat. Low-sodium crackers. Unsalted rice. Unsalted pasta. Low-sodium bread. Whole-grain breads and whole-grain pasta. Vegetables Fresh or frozen vegetables. "No salt added" canned vegetables. "No salt added" tomato sauce and paste. Low-sodium or reduced-sodium tomato and vegetable juice. Fruits Fresh, frozen, or canned fruit. Fruit juice. Meats and other protein  foods Fresh or frozen (no salt added) meat, poultry, seafood, and fish. Low-sodium canned tuna and salmon. Unsalted nuts. Dried peas, beans, and lentils without added salt. Unsalted canned beans. Eggs. Unsalted nut butters. Dairy Milk. Soy milk. Cheese that is naturally low in sodium, such as ricotta cheese,  fresh mozzarella, or Swiss cheese Low-sodium or reduced-sodium cheese. Cream cheese. Yogurt. Fats and oils Unsalted butter. Unsalted margarine with no trans fat. Vegetable oils such as canola or olive oils. Seasonings and other foods Fresh and dried herbs and spices. Salt-free seasonings. Low-sodium mustard and ketchup. Sodium-free salad dressing. Sodium-free light mayonnaise. Fresh or refrigerated horseradish. Lemon juice. Vinegar. Homemade, reduced-sodium, or low-sodium soups. Unsalted popcorn and pretzels. Low-salt or salt-free chips. What foods are not recommended? The items listed may not be a complete list. Talk with your dietitian about what dietary choices are best for you. Grains Instant hot cereals. Bread stuffing, pancake, and biscuit mixes. Croutons. Seasoned rice or pasta mixes. Noodle soup cups. Boxed or frozen macaroni and cheese. Regular salted crackers. Self-rising flour. Vegetables Sauerkraut, pickled vegetables, and relishes. Olives. [place] fries. Onion rings. Regular canned vegetables (not low-sodium or reduced-sodium). Regular canned tomato sauce and paste (not low-sodium or reduced-sodium). Regular tomato and vegetable juice (not low-sodium or reduced-sodium). Frozen vegetables in sauces. Meats and other protein foods Meat or fish that is salted, canned, smoked, spiced, or pickled. Bacon, ham, sausage, hotdogs, corned beef, chipped beef, packaged lunch meats, salt pork, jerky, pickled herring, anchovies, regular canned tuna, sardines, salted nuts. Dairy Processed cheese and cheese spreads. Cheese curds. Blue cheese. Feta cheese. String cheese. Regular cottage cheese. Buttermilk. Canned milk. Fats and oils Salted butter. Regular margarine. Ghee. Bacon fat. Seasonings and other foods Onion salt, garlic salt, seasoned salt, table salt, and sea salt. Canned and packaged gravies. Worcestershire sauce. Tartar sauce. Barbecue sauce. Teriyaki sauce. Soy sauce, including  reduced-sodium. Steak sauce. Fish sauce. Oyster sauce. Cocktail sauce. Horseradish that you find on the shelf. Regular ketchup and mustard. Meat flavorings and tenderizers. Bouillon cubes. Hot sauce and Tabasco sauce. Premade or packaged marinades. Premade or packaged taco seasonings. Relishes. Regular salad dressings. Salsa. Potato and tortilla chips. Corn chips and puffs. Salted popcorn and pretzels. Canned or dried soups. Pizza. Frozen entrees and pot pies. Summary  Eating less sodium can help lower your blood pressure, reduce swelling, and protect your heart, liver, and kidneys.  Most people on this plan should limit their sodium intake to 1,500-2,000 mg (milligrams) of sodium each day.  Canned, boxed, and frozen foods are high in sodium. Restaurant foods, fast foods, and pizza are also very high in sodium. You also get sodium by adding salt to food.  Try to cook at home, eat more fresh fruits and vegetables, and eat less fast food, canned, processed, or prepared foods. This information is not intended to replace advice given to you by your health care provider. Make sure you discuss any questions you have with your health care provider. Document Revised: 05/21/2017 Document Reviewed: 06/01/2016 Elsevier Patient Education  2020 [employer].

## 2019-10-31 NOTE — Progress Notes (Signed)
Cardiology Office Note:    Date:  10/31/2019   ID:  , DOB March 02, 1946, MRN [id number]  PCP:  [name], MD  Cardiologist:  [name], MD    Referring MD: [name], MD   Chief Complaint  Patient presents with  . Atrial Fibrillation  . Hypertension    History of Present Illness:    Erin Alvarez is a 74 y.o. female with a hx of atypical CPand subsequentstress echo with no ischemia,rheumatic fever as a child,grade 2 diastolic dysfunction by echo and persistent atrial fibrillation (first diagnosed in2013)and has been on Eliquis for a CHADS2Vasc score of 3, Flecainide and Cardizem. She has severe OSA with an initial AHI of 29/hr on her sleep study and is on CPAP.  She was seen back in February and was doing well but recently has had problems with LE edema.  She increased her Lasix but did not help much back at the end of April.  Her Lasix was increased to 40mg  daily for 3 days.  She apparently has a RedsVest at home that had been reading high and did not change with increase in Lasix. She is now here for evalution.     Past Medical History:  Diagnosis Date  . Allergic rhinitis, cause unspecified   . Disorder of bone and cartilage, unspecified   . Diverticulosis of colon (without mention of hemorrhage)   . Elbow fracture, right 12/12  . Essential hypertension, benign   . Family history of malignant neoplasm of breast   . H/O: rheumatic fever   . Heart murmur    dx as child - s/p rheumatic fever  . Leukocytopenia, unspecified   . Lung nodule 06/2009   CT of chest, 4 mm subpleural nodule in the superior segment of the right lower lobe  . Mild mitral regurgitation   . Mild tricuspid regurgitation   . OSA (obstructive sleep apnea)    Severe w AHI 29/hr and successful CPAP now on auto CPAP  . Osteopenia   . Persistent atrial fibrillation ([place]) 05/04/2012   on systemic anticoagulation w Xarelto for CHADS VASC score of 3  . Pneumonia, organism unspecified(486)    . Pulmonary HTN ([place])    resolved on echo 10/2015  . Sleep apnea    cpap  . Unspecified sinusitis (chronic)     Past Surgical History:  Procedure Laterality Date  . BREAST BIOPSY Left    biopsy - benign  . DILATION AND CURETTAGE OF UTERUS    . FOOT SURGERY Left   . TONSILLECTOMY    . TUBAL LIGATION      Current Medications: Current Meds  Medication Sig  . apixaban (ELIQUIS) 5 MG TABS tablet Take 1 tablet (5 mg total) by mouth 2 (two) times daily.  . Ascorbic Acid (VITAMIN C) 1000 MG tablet Take 1,000 mg by mouth daily.  . cetirizine (ZYRTEC) 10 MG tablet Take 10 mg by mouth daily as needed. For allergies  . diltiazem (CARDIZEM CD) 120 MG 24 hr capsule Take 1 capsule (120 mg total) by mouth daily.  Marland Kitchen estradiol (ESTRACE) 0.1 MG/GM vaginal cream Place 1 Applicatorful vaginally once a week.   . flecainide (TAMBOCOR) 100 MG tablet Take 1 tablet (100 mg total) by mouth 2 (two) times daily. Please make yearly appt with Dr. [name] for February before anymore refills. 1st attempt  . furosemide (LASIX) 20 MG tablet Take 1 tablet (20 mg total) by mouth daily as needed for edema.  . hydrocortisone 2.5 % cream  Apply 1 application topically 2 (two) times daily. Apply to lower lip BID  . losartan-hydrochlorothiazide (HYZAAR) 50-12.5 MG tablet Take 1 tablet by mouth daily.  . Magnesium 400 MG CAPS Take 400 mg by mouth daily.  . mometasone (NASONEX) 50 MCG/ACT nasal spray Place 2 sprays into the nose daily as needed (allergies).   . Multiple Vitamins-Minerals (CENTRUM PO) Take 1 tablet by mouth daily.    . NON FORMULARY CPAP at bedtime  . Omega-3 Fatty Acids (FISH OIL) 1200 MG CAPS Take 2 capsules by mouth daily.    . RESTASIS 0.05 % ophthalmic emulsion 1 drop 2 (two) times daily.   Current Facility-Administered Medications for the 10/31/19 encounter (Office Visit) with Sueanne Margarita, MD  Medication  . 0.9 %  sodium chloride infusion     Allergies:   Ciprofloxacin and Naproxen   Social  History   Socioeconomic History  . Marital status: Married    Spouse name: Richard  . Number of children: 2  . Years of education: Not on file  . Highest education level: Not on file  Occupational History  . Occupation: Retired Geophysical data processor: UNEMPLOYED  Tobacco Use  . Smoking status: Former Smoker    Packs/day: 2.00    Years: 15.00    Pack years: 30.00    Quit date: 06/22/1974    Years since quitting: 45.3  . Smokeless tobacco: Never Used  Substance and Sexual Activity  . Alcohol use: Yes    Alcohol/week: 0.0 standard drinks    Comment: rare wine  . Drug use: No  . Sexual activity: Not on file  Other Topics Concern  . Not on file  Social History Narrative   Married   2 children   Quit tobacco 1976 - smoked 2 ppd x 15   Retired - Emergency planning/management officer   Alcohol use: yes   Son coming in overseas - living in [place] Strain:   . Difficulty of Paying Living Expenses:   Food Insecurity:   . Worried About Charity fundraiser in the Last Year:   . Arboriculturist in the Last Year:   Transportation Needs:   . Film/video editor (Medical):   Marland Kitchen Lack of Transportation (Non-Medical):   Physical Activity:   . Days of Exercise per Week:   . Minutes of Exercise per Session:   Stress:   . Feeling of Stress :   Social Connections:   . Frequency of Communication with Friends and Family:   . Frequency of Social Gatherings with Friends and Family:   . Attends Religious Services:   . Active Member of Clubs or Organizations:   . Attends Archivist Meetings:   Marland Kitchen Marital Status:      Family History: The patient's family history includes Brain cancer in her mother; Emphysema in her father; Heart disease (age of onset: 20) in her mother; Hyperlipidemia in her brother and another family member; Hypertension in her brother, father, and another family member; Lung cancer in her mother.  ROS:   Please see the history  of present illness.    ROS  All other systems reviewed and negative.   EKGs/Labs/Other Studies Reviewed:    The following studies were reviewed today: none  EKG:  EKG is  ordered today.  The ekg ordered today demonstrates NSR with LAFB  Recent Labs: 08/23/2019: BUN 11; Creatinine, Ser 0.75; Hemoglobin 14.7; Platelets 236; Potassium  4.2; Sodium 142   Recent Lipid Panel    Component Value Date/Time   CHOL 169 07/03/2010 2029   TRIG 52 07/03/2010 2029   HDL 72 07/03/2010 2029   CHOLHDL 2.3 Ratio 07/03/2010 2029   VLDL 10 07/03/2010 2029   LDLCALC 87 07/03/2010 2029    Physical Exam:    VS:  BP 140/88   Pulse (!) 52   Ht 5\' 5"  (1.651 m)   Wt 191 lb 12.8 oz (87 kg)   BMI 31.92 kg/m     Wt Readings from Last 3 Encounters:  10/31/19 191 lb 12.8 oz (87 kg)  08/18/19 184 lb (83.5 kg)  08/18/18 174 lb (78.9 kg)     GEN:  Well nourished, well developed in no acute distress HEENT: Normal NECK: No JVD; No carotid bruits LYMPHATICS: No lymphadenopathy CARDIAC: RRR, no murmurs, rubs, gallops RESPIRATORY:  Clear to auscultation without rales, wheezing or rhonchi  ABDOMEN: Soft, non-tender, non-distended MUSCULOSKELETAL:  No edema; No deformity  SKIN: Warm and dry NEUROLOGIC:  Alert and oriented x 3 PSYCHIATRIC:  Normal affect   ASSESSMENT:    1. Persistent atrial fibrillation ([place])   2. HYPERTENSION, BENIGN ESSENTIAL   3. Pulmonary HTN ([place])   4. Leg edema    PLAN:    In order of problems listed above:  1.  Persistent atrial fibrillation -EKG today confirms NSR -denies any bleeding problems on Eliquis -Continue Eliquis 5mg  BID and Cardizem CD 120mg  daily, Flecainide 100mg  BID -Creatinine was 0.75 and K+ 4.2 on 08/23/2019, Hbg was normal as well at 14.7.  2.  HTN -BP controlled on exam today -continue Losartan 50mg  daily, Cardizem CD 120mg  daily -stop HCTZ since she is already on Lasix  3.  Pulmonary HTN -this was resolved on last 2D echo  4.  LE edema -this  has not really improved with diuretics  -she admits to salting her food and eating processed meats including bacon and sausage, salty snacks -continue Lasix  -stressed importance of 2gm Na diet.  -she is getting ready to start the [place] and I asked her to talk to her coach about this before she starts   Medication Adjustments/Labs and Tests Ordered: Current medicines are reviewed at length with the patient today.  Concerns regarding medicines are outlined above.  No orders of the defined types were placed in this encounter.  No orders of the defined types were placed in this encounter.   Signed, [name], MD  10/31/2019 10:41 AM    [place]

## 2019-11-01 ENCOUNTER — Telehealth: Payer: Self-pay | Admitting: Cardiology

## 2019-11-01 MED ORDER — FUROSEMIDE 20 MG PO TABS
20.0000 mg | ORAL_TABLET | Freq: Every day | ORAL | 3 refills | Status: DC
Start: 2019-11-01 — End: 2020-09-26

## 2019-11-01 NOTE — Telephone Encounter (Signed)
Spoke with the patient and advised her to take Lasix 20 mg daily and she can take an extra 20mg  as needed for increased swelling. Patient verbalized understanding.

## 2019-11-01 NOTE — Telephone Encounter (Signed)
Spoke with the patient who states she is confused about how much Lasix she should be taking. Patient reports she had been taking it as needed but with her recent swelling she has been taking 20mg  daily and 40mg  if needed. Her losartan-HCTZ was discontinued yesterday so she is wondering if she is supposed to continue taking 20 mg Lasix or just take it as needed.

## 2019-11-01 NOTE — Telephone Encounter (Signed)
Pt c/o medication issue:  1. Name of Medication: losartan (COZAAR) 50 MG tablet  2. How are you currently taking this medication (dosage and times per day)? As directed  3. Are you having a reaction (difficulty breathing--STAT)? no  4. What is your medication issue? Patient has some questions regarding this medication. She states she is a little confused on the instructions.

## 2019-11-01 NOTE — Telephone Encounter (Signed)
She is to take 20mg  daily and take an extra 20mg  PRN for more swelling

## 2019-11-13 DIAGNOSIS — Z1231 Encounter for screening mammogram for malignant neoplasm of breast: Secondary | ICD-10-CM | POA: Diagnosis not present

## 2019-11-17 DIAGNOSIS — Z0000 Encounter for general adult medical examination without abnormal findings: Secondary | ICD-10-CM | POA: Diagnosis not present

## 2019-11-17 DIAGNOSIS — M859 Disorder of bone density and structure, unspecified: Secondary | ICD-10-CM | POA: Diagnosis not present

## 2019-11-17 DIAGNOSIS — I10 Essential (primary) hypertension: Secondary | ICD-10-CM | POA: Diagnosis not present

## 2019-11-24 DIAGNOSIS — I10 Essential (primary) hypertension: Secondary | ICD-10-CM | POA: Diagnosis not present

## 2019-11-24 DIAGNOSIS — Z1331 Encounter for screening for depression: Secondary | ICD-10-CM | POA: Diagnosis not present

## 2019-11-24 DIAGNOSIS — D6869 Other thrombophilia: Secondary | ICD-10-CM | POA: Diagnosis not present

## 2019-11-24 DIAGNOSIS — R82998 Other abnormal findings in urine: Secondary | ICD-10-CM | POA: Diagnosis not present

## 2019-11-24 DIAGNOSIS — M859 Disorder of bone density and structure, unspecified: Secondary | ICD-10-CM | POA: Diagnosis not present

## 2019-11-24 DIAGNOSIS — I4891 Unspecified atrial fibrillation: Secondary | ICD-10-CM | POA: Diagnosis not present

## 2019-11-24 DIAGNOSIS — Z0000 Encounter for general adult medical examination without abnormal findings: Secondary | ICD-10-CM | POA: Diagnosis not present

## 2019-11-24 DIAGNOSIS — R600 Localized edema: Secondary | ICD-10-CM | POA: Diagnosis not present

## 2019-11-24 DIAGNOSIS — G4733 Obstructive sleep apnea (adult) (pediatric): Secondary | ICD-10-CM | POA: Diagnosis not present

## 2019-11-24 DIAGNOSIS — I272 Pulmonary hypertension, unspecified: Secondary | ICD-10-CM | POA: Diagnosis not present

## 2019-11-24 DIAGNOSIS — L309 Dermatitis, unspecified: Secondary | ICD-10-CM | POA: Diagnosis not present

## 2019-12-06 DIAGNOSIS — G4733 Obstructive sleep apnea (adult) (pediatric): Secondary | ICD-10-CM | POA: Diagnosis not present

## 2019-12-07 DIAGNOSIS — Z1212 Encounter for screening for malignant neoplasm of rectum: Secondary | ICD-10-CM | POA: Diagnosis not present

## 2019-12-10 ENCOUNTER — Other Ambulatory Visit: Payer: Self-pay | Admitting: Cardiology

## 2019-12-11 NOTE — Telephone Encounter (Signed)
*  STAT* If patient is at the pharmacy, call can be transferred to refill team.   1. Which medications need to be refilled? (please list name of each medication and dose if known)  Flecainide  2. Which pharmacy/location (including street and city if local pharmacy) is medication to be sent to? [facility]  3. Do they need a 30 day or 90 day supply? 90 days and refills

## 2019-12-12 NOTE — Telephone Encounter (Signed)
Patient calling the office for samples of medication:   1.  What medication and dosage are you requesting samples for? flecainide (TAMBOCOR) 100 MG tablet  2.  Are you currently out of this medication? Patient is requesting samples to hold her until mail order from [place] is received. She states she will be completely out of medication by Friday, 06/25/212. Please assist.

## 2019-12-13 MED ORDER — FLECAINIDE ACETATE 100 MG PO TABS: 100.0000 mg | ORAL_TABLET | Freq: Two times a day (BID) | ORAL | 0 refills | Status: DC

## 2019-12-13 NOTE — Telephone Encounter (Signed)
Pt's medication was sent to pt's pharmacy as requested. And a 10 day supply was also sent to local pharmacy until mail order arrives. Confirmation received for both.

## 2019-12-20 DIAGNOSIS — H43813 Vitreous degeneration, bilateral: Secondary | ICD-10-CM | POA: Diagnosis not present

## 2019-12-20 DIAGNOSIS — H2513 Age-related nuclear cataract, bilateral: Secondary | ICD-10-CM | POA: Diagnosis not present

## 2019-12-20 DIAGNOSIS — H524 Presbyopia: Secondary | ICD-10-CM | POA: Diagnosis not present

## 2019-12-20 DIAGNOSIS — H04123 Dry eye syndrome of bilateral lacrimal glands: Secondary | ICD-10-CM | POA: Diagnosis not present

## 2020-01-30 ENCOUNTER — Telehealth: Payer: Self-pay | Admitting: Cardiology

## 2020-01-30 DIAGNOSIS — I4819 Other persistent atrial fibrillation: Secondary | ICD-10-CM

## 2020-01-30 NOTE — Telephone Encounter (Signed)
STAT if HR is under 50 or over 120 (normal HR is 60-100 beats per minute)  1) What is your heart rate? 62 now  2) Do you have a log of your heart rate readings (document readings)? Last night dropped to 42  3) Do you have any other symptoms? left ear is stopped up   Patient states she was alerted that during the night her HR dropped 3 times. She states it happened once the night before too. She states her HR is now 51 and she is not having any other symptoms other than a stopped up left ear.

## 2020-01-30 NOTE — Telephone Encounter (Signed)
I spoke with patient and gave her information from Dr [name].  Patient aware monitor will be mailed to her home.

## 2020-01-30 NOTE — Telephone Encounter (Signed)
It is very common for HR to get into the 30-40's during sleep.  I would not adjust flecainide and keep at prescribed dose.  Please get a 3 day Ziopatch to assess HR

## 2020-01-30 NOTE — Telephone Encounter (Signed)
I spoke with patient. She reports she wears an Apple watch and the last 2 nights she has received low heart rate alerts.  She was sleeping at the time and heart rate alarm woke her up. Last night heart rate was 42 for 10 minutes.  This happened twice during the night. Current heart rate is 62.  She is not having any symptoms--no dizziness or lightheadedness. She decreased her flecainide dose to 100 mg in the AM and 50 mg in the PM the last 2 nights due to heart rate readings.  She [place] check BP [place] and [place] not have BP cuff with her.

## 2020-02-08 ENCOUNTER — Other Ambulatory Visit (INDEPENDENT_AMBULATORY_CARE_PROVIDER_SITE_OTHER): Payer: Medicare HMO

## 2020-02-08 DIAGNOSIS — I4819 Other persistent atrial fibrillation: Secondary | ICD-10-CM | POA: Diagnosis not present

## 2020-02-12 DIAGNOSIS — G4733 Obstructive sleep apnea (adult) (pediatric): Secondary | ICD-10-CM | POA: Diagnosis not present

## 2020-02-23 DIAGNOSIS — I4819 Other persistent atrial fibrillation: Secondary | ICD-10-CM | POA: Diagnosis not present

## 2020-04-04 ENCOUNTER — Encounter: Payer: Self-pay | Admitting: Physician Assistant

## 2020-04-04 ENCOUNTER — Other Ambulatory Visit (HOSPITAL_COMMUNITY): Payer: Self-pay | Admitting: Physician Assistant

## 2020-04-04 DIAGNOSIS — I272 Pulmonary hypertension, unspecified: Secondary | ICD-10-CM | POA: Diagnosis not present

## 2020-04-04 DIAGNOSIS — I4891 Unspecified atrial fibrillation: Secondary | ICD-10-CM | POA: Diagnosis not present

## 2020-04-04 DIAGNOSIS — R059 Cough, unspecified: Secondary | ICD-10-CM | POA: Diagnosis not present

## 2020-04-04 DIAGNOSIS — U071 COVID-19: Secondary | ICD-10-CM | POA: Diagnosis not present

## 2020-04-04 DIAGNOSIS — G4733 Obstructive sleep apnea (adult) (pediatric): Secondary | ICD-10-CM | POA: Diagnosis not present

## 2020-04-04 DIAGNOSIS — Z1152 Encounter for screening for COVID-19: Secondary | ICD-10-CM | POA: Diagnosis not present

## 2020-04-04 NOTE — Progress Notes (Signed)
I connected by phone with Erin Alvarez on 04/04/2020 at 3:46 PM to discuss the potential use of a new treatment for mild to moderate COVID-19 viral infection in non-hospitalized patients.  This patient is a 74 y.o. female that meets the FDA criteria for Emergency Use Authorization of COVID monoclonal antibody casirivimab/imdevimab or bamlanivimab/eteseviamb.  Has a (+) direct SARS-CoV-2 viral test result  Has mild or moderate COVID-19   Is NOT hospitalized due to COVID-19  Is within 10 days of symptom onset  Has at least one of the high risk factor(s) for progression to severe COVID-19 and/or hospitalization as defined in EUA.  Specific high risk criteria : Older age (>/= 76 yo) and Cardiovascular disease or hypertension   I have spoken and communicated the following to the patient or parent/caregiver regarding COVID monoclonal antibody treatment:  1. FDA has authorized the emergency use for the treatment of mild to moderate COVID-19 in adults and pediatric patients with positive results of direct SARS-CoV-2 viral testing who are 57 years of age and older weighing at least 40 kg, and who are at high risk for progressing to severe COVID-19 and/or hospitalization.  2. The significant known and potential risks and benefits of COVID monoclonal antibody, and the extent to which such potential risks and benefits are unknown.  3. Information on available alternative treatments and the risks and benefits of those alternatives, including clinical trials.  4. Patients treated with COVID monoclonal antibody should continue to self-isolate and use infection control measures (e.g., wear mask, isolate, social distance, avoid sharing personal items, clean and disinfect "high touch" surfaces, and frequent handwashing) according to CDC guidelines.   5. The patient or parent/caregiver has the option to accept or refuse COVID monoclonal antibody treatment.  After reviewing this information with the  patient, the patient has agreed to receive one of the available covid 19 monoclonal antibodies and will be provided an appropriate fact sheet prior to infusion. Konrad Felix, PA-C 04/04/2020 3:46 PM

## 2020-04-05 ENCOUNTER — Other Ambulatory Visit (HOSPITAL_COMMUNITY): Payer: Self-pay

## 2020-04-05 ENCOUNTER — Ambulatory Visit (HOSPITAL_COMMUNITY)
Admission: RE | Admit: 2020-04-05 | Discharge: 2020-04-05 | Disposition: A | Discharge status: Home / Self Care | Payer: Medicare Other | Source: Ambulatory Visit | Attending: Pulmonary Disease | Admitting: Pulmonary Disease

## 2020-04-05 DIAGNOSIS — U071 COVID-19: Secondary | ICD-10-CM | POA: Diagnosis present

## 2020-04-05 DIAGNOSIS — Z23 Encounter for immunization: Secondary | ICD-10-CM | POA: Insufficient documentation

## 2020-04-05 MED ORDER — FAMOTIDINE IN NACL 20-0.9 MG/50ML-% IV SOLN: 20.0000 mg | Freq: Once | INTRAVENOUS | Status: DC | PRN

## 2020-04-05 MED ORDER — DIPHENHYDRAMINE HCL 50 MG/ML IJ SOLN: 50.0000 mg | Freq: Once | INTRAMUSCULAR | Status: DC | PRN

## 2020-04-05 MED ORDER — SODIUM CHLORIDE 0.9 % IV SOLN: INTRAVENOUS | Status: DC | PRN

## 2020-04-05 MED ORDER — SODIUM CHLORIDE 0.9 % IV SOLN: Freq: Once | INTRAVENOUS | Status: AC

## 2020-04-05 MED ORDER — EPINEPHRINE 0.3 MG/0.3ML IJ SOAJ: 0.3000 mg | Freq: Once | INTRAMUSCULAR | Status: DC | PRN

## 2020-04-05 MED ORDER — METHYLPREDNISOLONE SODIUM SUCC 125 MG IJ SOLR: 125.0000 mg | Freq: Once | INTRAMUSCULAR | Status: DC | PRN

## 2020-04-05 MED ORDER — ALBUTEROL SULFATE HFA 108 (90 BASE) MCG/ACT IN AERS: 2.0000 | INHALATION_SPRAY | Freq: Once | RESPIRATORY_TRACT | Status: DC | PRN

## 2020-04-05 NOTE — Progress Notes (Signed)
  Diagnosis: COVID-19  Physician: Dr Joya Gaskins  Procedure: [facility] Med: bamlanivimab\etesevimab infusion - Provided patient with bamlanimivab\etesevimab fact sheet for patients, parents and caregivers prior to infusion.  Complications: No immediate complications noted.  Discharge: Discharged home   Erin Alvarez, Erin Alvarez 04/05/2020

## 2020-04-05 NOTE — Discharge Instructions (Signed)

## 2020-04-25 DIAGNOSIS — H903 Sensorineural hearing loss, bilateral: Secondary | ICD-10-CM | POA: Diagnosis not present

## 2020-04-25 DIAGNOSIS — R070 Pain in throat: Secondary | ICD-10-CM | POA: Diagnosis not present

## 2020-04-25 DIAGNOSIS — H9313 Tinnitus, bilateral: Secondary | ICD-10-CM | POA: Diagnosis not present

## 2020-05-14 DIAGNOSIS — G4733 Obstructive sleep apnea (adult) (pediatric): Secondary | ICD-10-CM | POA: Diagnosis not present

## 2020-07-01 DIAGNOSIS — L299 Pruritus, unspecified: Secondary | ICD-10-CM | POA: Diagnosis not present

## 2020-07-01 DIAGNOSIS — L400 Psoriasis vulgaris: Secondary | ICD-10-CM | POA: Diagnosis not present

## 2020-07-01 DIAGNOSIS — L820 Inflamed seborrheic keratosis: Secondary | ICD-10-CM | POA: Diagnosis not present

## 2020-07-01 DIAGNOSIS — L578 Other skin changes due to chronic exposure to nonionizing radiation: Secondary | ICD-10-CM | POA: Diagnosis not present

## 2020-07-11 DIAGNOSIS — N952 Postmenopausal atrophic vaginitis: Secondary | ICD-10-CM | POA: Diagnosis not present

## 2020-07-11 DIAGNOSIS — Z01419 Encounter for gynecological examination (general) (routine) without abnormal findings: Secondary | ICD-10-CM | POA: Diagnosis not present

## 2020-07-11 DIAGNOSIS — Z6826 Body mass index (BMI) 26.0-26.9, adult: Secondary | ICD-10-CM | POA: Diagnosis not present

## 2020-07-11 DIAGNOSIS — N841 Polyp of cervix uteri: Secondary | ICD-10-CM | POA: Diagnosis not present

## 2020-07-12 DIAGNOSIS — H10413 Chronic giant papillary conjunctivitis, bilateral: Secondary | ICD-10-CM | POA: Diagnosis not present

## 2020-07-25 DIAGNOSIS — H00015 Hordeolum externum left lower eyelid: Secondary | ICD-10-CM | POA: Diagnosis not present

## 2020-07-25 DIAGNOSIS — H04123 Dry eye syndrome of bilateral lacrimal glands: Secondary | ICD-10-CM | POA: Diagnosis not present

## 2020-08-13 DIAGNOSIS — L400 Psoriasis vulgaris: Secondary | ICD-10-CM | POA: Diagnosis not present

## 2020-08-13 DIAGNOSIS — L728 Other follicular cysts of the skin and subcutaneous tissue: Secondary | ICD-10-CM | POA: Diagnosis not present

## 2020-08-13 DIAGNOSIS — G4733 Obstructive sleep apnea (adult) (pediatric): Secondary | ICD-10-CM | POA: Diagnosis not present

## 2020-09-26 ENCOUNTER — Other Ambulatory Visit: Payer: Self-pay | Admitting: Cardiology

## 2020-09-26 DIAGNOSIS — R600 Localized edema: Secondary | ICD-10-CM

## 2020-10-01 DIAGNOSIS — L400 Psoriasis vulgaris: Secondary | ICD-10-CM | POA: Diagnosis not present

## 2020-10-01 DIAGNOSIS — L578 Other skin changes due to chronic exposure to nonionizing radiation: Secondary | ICD-10-CM | POA: Diagnosis not present

## 2020-10-01 DIAGNOSIS — L299 Pruritus, unspecified: Secondary | ICD-10-CM | POA: Diagnosis not present

## 2020-10-05 ENCOUNTER — Other Ambulatory Visit: Payer: Self-pay | Admitting: Cardiology

## 2020-10-28 ENCOUNTER — Ambulatory Visit: Payer: Medicare Other | Admitting: Cardiology

## 2020-10-29 ENCOUNTER — Other Ambulatory Visit: Payer: Self-pay | Admitting: Cardiology

## 2020-10-29 DIAGNOSIS — L400 Psoriasis vulgaris: Secondary | ICD-10-CM | POA: Diagnosis not present

## 2020-10-29 DIAGNOSIS — R600 Localized edema: Secondary | ICD-10-CM

## 2020-10-29 DIAGNOSIS — L299 Pruritus, unspecified: Secondary | ICD-10-CM | POA: Diagnosis not present

## 2020-11-06 DIAGNOSIS — G4733 Obstructive sleep apnea (adult) (pediatric): Secondary | ICD-10-CM | POA: Diagnosis not present

## 2020-11-14 DIAGNOSIS — Z1231 Encounter for screening mammogram for malignant neoplasm of breast: Secondary | ICD-10-CM | POA: Diagnosis not present

## 2020-11-19 ENCOUNTER — Telehealth: Payer: Self-pay | Admitting: *Deleted

## 2020-11-19 ENCOUNTER — Ambulatory Visit: Payer: Medicare HMO | Admitting: Cardiology

## 2020-11-19 ENCOUNTER — Other Ambulatory Visit: Payer: Self-pay

## 2020-11-19 ENCOUNTER — Encounter: Payer: Self-pay | Admitting: Cardiology

## 2020-11-19 VITALS — BP 128/70 | HR 52 | Ht 65.0 in | Wt 158.2 lb

## 2020-11-19 DIAGNOSIS — I272 Pulmonary hypertension, unspecified: Secondary | ICD-10-CM

## 2020-11-19 DIAGNOSIS — I4819 Other persistent atrial fibrillation: Secondary | ICD-10-CM

## 2020-11-19 DIAGNOSIS — R600 Localized edema: Secondary | ICD-10-CM

## 2020-11-19 DIAGNOSIS — G4733 Obstructive sleep apnea (adult) (pediatric): Secondary | ICD-10-CM

## 2020-11-19 DIAGNOSIS — Z9989 Dependence on other enabling machines and devices: Secondary | ICD-10-CM | POA: Diagnosis not present

## 2020-11-19 DIAGNOSIS — I10 Essential (primary) hypertension: Secondary | ICD-10-CM

## 2020-11-19 MED ORDER — DILTIAZEM HCL ER COATED BEADS 120 MG PO CP24
120.0000 mg | ORAL_CAPSULE | Freq: Every day | ORAL | 3 refills | Status: DC
Start: 2020-11-19 — End: 2021-11-24

## 2020-11-19 MED ORDER — FLECAINIDE ACETATE 100 MG PO TABS
100.0000 mg | ORAL_TABLET | Freq: Two times a day (BID) | ORAL | 3 refills | Status: DC
Start: 2020-11-19 — End: 2021-12-22

## 2020-11-19 MED ORDER — FUROSEMIDE 20 MG PO TABS: ORAL_TABLET | ORAL | 3 refills | Status: DC

## 2020-11-19 MED ORDER — ELIQUIS 5 MG PO TABS
5.0000 mg | ORAL_TABLET | Freq: Two times a day (BID) | ORAL | 3 refills | Status: DC
Start: 2020-11-19 — End: 2021-11-24

## 2020-11-19 NOTE — Telephone Encounter (Signed)
-----   Message from Sueanne Margarita, MD sent at 11/19/2020  2:42 PM EDT ----- change her to auto CPAP from 4 to 10cm H2O to see if lower pressure helps her dry mouth>>get download in 2 weeks

## 2020-11-19 NOTE — Addendum Note (Signed)
Addended by: Antonieta Iba on: 11/19/2020 02:44 PM   Modules accepted: Orders

## 2020-11-19 NOTE — Patient Instructions (Signed)
Medication Instructions:  Your physician recommends that you continue on your current medications as directed. Please refer to the Current Medication list given to you today.  *If you need a refill on your cardiac medications before your next appointment, please call your pharmacy*   Lab Work: TODAY: CBC, BMET If you have labs (blood work) drawn today and your tests are completely normal, you will receive your results only by: Marland Kitchen MyChart Message (if you have MyChart) OR . A paper copy in the mail If you have any lab test that is abnormal or we need to change your treatment, we will call you to review the results.  Follow-Up: At [facility], you and your health needs are our priority.  As part of our continuing mission to provide you with exceptional heart care, we have created designated Provider Care Teams.  These Care Teams include your primary Cardiologist (physician) and Advanced Practice Providers (APPs -  Physician Assistants and Nurse Practitioners) who all work together to provide you with the care you need, when you need it.  Your next appointment:   1 year(s)  The format for your next appointment:   In Person  Provider:   You may see [name], MD or one of the following Advanced Practice Providers on your designated Care Team:    Melina Copa, PA-C  Ermalinda Barrios, PA-C

## 2020-11-19 NOTE — Progress Notes (Signed)
Cardiology Office Note:    Date:  11/19/2020   ID:  , DOB 1946/04/24, MRN [id number]  PCP:  [name], MD  Cardiologist:  [name], MD    Referring MD: [name], MD   Chief Complaint  Patient presents with  . Atrial Fibrillation  . Hypertension    History of Present Illness:    Erin Alvarez is a 75 y.o. female with a hx of atypical CPand subsequentstress echo with no ischemia,rheumatic fever as a child,grade 2 diastolic dysfunction by echo and persistent atrial fibrillation (first diagnosed in2013)and has been on Eliquis for a CHADS2Vasc score of 3, Flecainide and Cardizem. She has severe OSA with an initial AHI of 29/hr on her sleep study and is on CPAP.  She is here today for followup and is doing well.  She denies any chest pain or pressure, SOB, DOE, PND, orthopnea, LE edema, dizziness, palpitations or syncope. She is compliant with her meds and is tolerating meds with no SE.    She is doing well with her CPAP device and thinks that she has gotten used to it.  She tolerates the mask and feels the pressure is adequate.  Since going on CPAP she feels rested in the am and has no significant daytime sleepiness.  She denies any significant mouth or nasal dryness or nasal congestion.  She does not think that he snores.     Past Medical History:  Diagnosis Date  . Allergic rhinitis, cause unspecified   . Disorder of bone and cartilage, unspecified   . Diverticulosis of colon (without mention of hemorrhage)   . Elbow fracture, right 12/12  . Essential hypertension, benign   . Family history of malignant neoplasm of breast   . H/O: rheumatic fever   . Heart murmur    dx as child - s/p rheumatic fever  . Leukocytopenia, unspecified   . Lung nodule 06/2009   CT of chest, 4 mm subpleural nodule in the superior segment of the right lower lobe  . Mild mitral regurgitation   . Mild tricuspid regurgitation   . OSA (obstructive sleep apnea)    Severe w AHI 29/hr  and successful CPAP now on auto CPAP  . Osteopenia   . Persistent atrial fibrillation ([place]) 05/04/2012   on systemic anticoagulation w Xarelto for CHADS VASC score of 3  . Pneumonia, organism unspecified(486)   . Pulmonary HTN ([place])    resolved on echo 10/2015  . Sleep apnea    cpap  . Unspecified sinusitis (chronic)     Past Surgical History:  Procedure Laterality Date  . BREAST BIOPSY Left    biopsy - benign  . DILATION AND CURETTAGE OF UTERUS    . FOOT SURGERY Left   . TONSILLECTOMY    . TUBAL LIGATION      Current Medications: Current Meds  Medication Sig  . apixaban (ELIQUIS) 5 MG TABS tablet Take 1 tablet (5 mg total) by mouth 2 (two) times daily.  Marland Kitchen Apremilast (OTEZLA) 30 MG TABS Take 30 mg by mouth daily.  . Ascorbic Acid (VITAMIN C) 1000 MG tablet Take 1,000 mg by mouth daily.  . cetirizine (ZYRTEC) 10 MG tablet Take 10 mg by mouth daily as needed. For allergies  . diltiazem (CARDIZEM CD) 120 MG 24 hr capsule TAKE 1 CAPSULE DAILY  . estradiol (ESTRACE) 0.1 MG/GM vaginal cream Place 1 Applicatorful vaginally once a week.  . flecainide (TAMBOCOR) 100 MG tablet TAKE 1 TABLET TWICE A DAY  .  furosemide (LASIX) 20 MG tablet TAKE 1 TABLET BY MOUTH ONCE DAILY AS NEEDED FOR FLUID  . hydrocortisone 2.5 % cream Apply 1 application topically 2 (two) times daily. Apply to lower lip BID  . losartan (COZAAR) 50 MG tablet Take 1 tablet (50 mg total) by mouth daily.  . Magnesium 400 MG CAPS Take 400 mg by mouth daily.  . mometasone (NASONEX) 50 MCG/ACT nasal spray Place 2 sprays into the nose daily as needed (allergies).  . Multiple Vitamins-Minerals (CENTRUM PO) Take 1 tablet by mouth daily.    . NON FORMULARY CPAP at bedtime  . Omega-3 Fatty Acids (FISH OIL) 1200 MG CAPS Take 2 capsules by mouth daily.    . RESTASIS 0.05 % ophthalmic emulsion 1 drop 2 (two) times daily.   Current Facility-Administered Medications for the 11/19/20 encounter (Office Visit) with Sueanne Margarita, MD   Medication  . 0.9 %  sodium chloride infusion     Allergies:   Ciprofloxacin and Naproxen   Social History   Socioeconomic History  . Marital status: Married    Spouse name: Richard  . Number of children: 2  . Years of education: Not on file  . Highest education level: Not on file  Occupational History  . Occupation: Retired Geophysical data processor: UNEMPLOYED  Tobacco Use  . Smoking status: Former Smoker    Packs/day: 2.00    Years: 15.00    Pack years: 30.00    Quit date: 06/22/1974    Years since quitting: 46.4  . Smokeless tobacco: Never Used  Vaping Use  . Vaping Use: Never used  Substance and Sexual Activity  . Alcohol use: Yes    Alcohol/week: 0.0 standard drinks    Comment: rare wine  . Drug use: No  . Sexual activity: Not on file  Other Topics Concern  . Not on file  Social History Narrative   Married   2 children   Quit tobacco 1976 - smoked 2 ppd x 15   Retired - Emergency planning/management officer   Alcohol use: yes   Son coming in overseas - living in [place] Strain: Not on file  Food Insecurity: Not on file  Transportation Needs: Not on file  Physical Activity: Not on file  Stress: Not on file  Social Connections: Not on file     Family History: The patient's family history includes Brain cancer in her mother; Emphysema in her father; Heart disease (age of onset: 29) in her mother; Hyperlipidemia in her brother and another family member; Hypertension in her brother, father, and another family member; Lung cancer in her mother.  ROS:   Please see the history of present illness.    Review of Systems  Musculoskeletal: Negative for gout.    All other systems reviewed and negative.   EKGs/Labs/Other Studies Reviewed:    The following studies were reviewed today: none  EKG:  EKG is  ordered today.  The ekg ordered today demonstrates sinus bradycardia with iRBBB and LAFB  Recent Labs: No results found for  requested labs within last 8760 hours.   Recent Lipid Panel    Component Value Date/Time   CHOL 169 07/03/2010 2029   TRIG 52 07/03/2010 2029   HDL 72 07/03/2010 2029   CHOLHDL 2.3 Ratio 07/03/2010 2029   VLDL 10 07/03/2010 2029   LDLCALC 87 07/03/2010 2029    Physical Exam:    VS:  BP 128/70  Pulse (!) 52   Ht 5\' 5"  (1.651 m)   Wt 158 lb 3.2 oz (71.8 kg)   SpO2 96%   BMI 26.33 kg/m     Wt Readings from Last 3 Encounters:  11/19/20 158 lb 3.2 oz (71.8 kg)  10/31/19 191 lb 12.8 oz (87 kg)  08/18/19 184 lb (83.5 kg)     GEN: Well nourished, well developed in no acute distress HEENT: Normal NECK: No JVD; No carotid bruits LYMPHATICS: No lymphadenopathy CARDIAC:RRR, no murmurs, rubs, gallops RESPIRATORY:  Clear to auscultation without rales, wheezing or rhonchi  ABDOMEN: Soft, non-tender, non-distended MUSCULOSKELETAL:  No edema; No deformity  SKIN: Warm and dry NEUROLOGIC:  Alert and oriented x 3 PSYCHIATRIC:  Normal affect    ASSESSMENT:    1. Persistent atrial fibrillation ([place])   2. HYPERTENSION, BENIGN ESSENTIAL   3. Pulmonary HTN ([place])   4. Leg edema   5. OSA on CPAP    PLAN:    In order of problems listed above:  1.  Persistent atrial fibrillation -she continues to maintain NSR on exam today -she has not had any bleeding issues on Eliquis but does have some bruising -Continue prescription drug management with Eliquis 5mg  BID, Cardizem CD 120mg  daily, Flecainide 100mg  BID with refills sent today -EKG intervals are stable on Flecainide -check BMET and CBC today  2.  HTN -BP is well controlled on exam today -Continue prescription drug management with Losartan 50mg  daily and Cardizem CD 120mg  daily  3.  Pulmonary HTN -this was resolved on last 2D echo  4.  LE edema -this is well controlled on diuretics  5.  OSA - The patient is tolerating PAP therapy well without any problems. The PAP download performed by his DME was personally reviewed and  interpreted by me today and showed an AHI of 1.2/hr on 8 cm H2O with 97% compliance in using more than 4 hours nightly.  The patient has been using and benefiting from PAP use and will continue to benefit from therapy.  -I will change her to auto CPAP from 4 to 10cm H2O to see if lower pressure helps her dry mouth   Medication Adjustments/Labs and Tests Ordered: Current medicines are reviewed at length with the patient today.  Concerns regarding medicines are outlined above.  Orders Placed This Encounter  Procedures  . EKG 12-Lead   No orders of the defined types were placed in this encounter.   Signed, [name], MD  11/19/2020 2:39 PM    [place]

## 2020-11-19 NOTE — Addendum Note (Signed)
Addended by: Antonieta Iba on: 11/19/2020 02:47 PM   Modules accepted: Orders

## 2020-11-19 NOTE — Telephone Encounter (Signed)
Order placed to [facility] via community message to change her to auto CPAP from 4 to 10cm H2O to see if lower pressure helps her dry mouth>>get download in 2 weeks

## 2020-11-20 LAB — BASIC METABOLIC PANEL
BUN/Creatinine Ratio: 21 (ref 12–28)
BUN: 19 mg/dL (ref 8–27)
CO2: 22 mmol/L (ref 20–29)
Calcium: 9.1 mg/dL (ref 8.7–10.3)
Chloride: 100 mmol/L (ref 96–106)
Creatinine, Ser: 0.92 mg/dL (ref 0.57–1.00)
Glucose: 89 mg/dL (ref 65–99)
Potassium: 4.1 mmol/L (ref 3.5–5.2)
Sodium: 141 mmol/L (ref 134–144)
eGFR: 65 mL/min/{1.73_m2} (ref >59)

## 2020-11-20 LAB — CBC
Hematocrit: 42 % (ref 34.0–46.6)
Hemoglobin: 13.7 g/dL (ref 11.1–15.9)
MCH: 29.7 pg (ref 26.6–33.0)
MCHC: 32.6 g/dL (ref 31.5–35.7)
MCV: 91 fL (ref 79–97)
Platelets: 244 10*3/uL (ref 150–450)
RBC: 4.62 x10E6/uL (ref 3.77–5.28)
RDW: 12 % (ref 11.7–15.4)
WBC: 7.6 10*3/uL (ref 3.4–10.8)

## 2020-12-03 DIAGNOSIS — L400 Psoriasis vulgaris: Secondary | ICD-10-CM | POA: Diagnosis not present

## 2020-12-03 DIAGNOSIS — L299 Pruritus, unspecified: Secondary | ICD-10-CM | POA: Diagnosis not present

## 2020-12-07 DIAGNOSIS — G4733 Obstructive sleep apnea (adult) (pediatric): Secondary | ICD-10-CM | POA: Diagnosis not present

## 2020-12-10 DIAGNOSIS — H00014 Hordeolum externum left upper eyelid: Secondary | ICD-10-CM | POA: Diagnosis not present

## 2020-12-18 DIAGNOSIS — H0100B Unspecified blepharitis left eye, upper and lower eyelids: Secondary | ICD-10-CM | POA: Diagnosis not present

## 2020-12-18 DIAGNOSIS — H0014 Chalazion left upper eyelid: Secondary | ICD-10-CM | POA: Diagnosis not present

## 2020-12-21 ENCOUNTER — Other Ambulatory Visit: Payer: Self-pay | Admitting: Cardiology

## 2020-12-24 ENCOUNTER — Telehealth: Payer: Self-pay | Admitting: Cardiology

## 2020-12-24 MED ORDER — LOSARTAN POTASSIUM 50 MG PO TABS
50.0000 mg | ORAL_TABLET | Freq: Every day | ORAL | 3 refills | Status: DC
Start: 2020-12-24 — End: 2020-12-24

## 2020-12-24 MED ORDER — LOSARTAN POTASSIUM 50 MG PO TABS
50.0000 mg | ORAL_TABLET | Freq: Every day | ORAL | 0 refills | Status: DC
Start: 2020-12-24 — End: 2021-11-24

## 2020-12-24 NOTE — Telephone Encounter (Signed)
Pt's medication was sent to mail order pharmacy and local pharmacy for a 15 day supply until mail order arrives. Confirmation for both received.

## 2020-12-24 NOTE — Telephone Encounter (Signed)
  *  STAT* If patient is at the pharmacy, call can be transferred to refill team.   1. Which medications need to be refilled? (please list name of each medication and dose if known) losartan (COZAAR) 50 MG tablet  2. Which pharmacy/location (including street and city if local pharmacy) is medication to be sent to? [place], [place]  3. Do they need a 30 day or 90 day supply? 90 days   Pt also need emergency refill send to [place], [place] - [address] For 2 weeks supply, she is running out of meds tomorrow

## 2021-01-01 DIAGNOSIS — H5203 Hypermetropia, bilateral: Secondary | ICD-10-CM | POA: Diagnosis not present

## 2021-01-01 DIAGNOSIS — H2513 Age-related nuclear cataract, bilateral: Secondary | ICD-10-CM | POA: Diagnosis not present

## 2021-01-01 DIAGNOSIS — H25013 Cortical age-related cataract, bilateral: Secondary | ICD-10-CM | POA: Diagnosis not present

## 2021-01-01 DIAGNOSIS — H0014 Chalazion left upper eyelid: Secondary | ICD-10-CM | POA: Diagnosis not present

## 2021-01-06 DIAGNOSIS — G4733 Obstructive sleep apnea (adult) (pediatric): Secondary | ICD-10-CM | POA: Diagnosis not present

## 2021-01-11 DIAGNOSIS — J0100 Acute maxillary sinusitis, unspecified: Secondary | ICD-10-CM | POA: Diagnosis not present

## 2021-01-11 DIAGNOSIS — Z20828 Contact with and (suspected) exposure to other viral communicable diseases: Secondary | ICD-10-CM | POA: Diagnosis not present

## 2021-01-11 DIAGNOSIS — R509 Fever, unspecified: Secondary | ICD-10-CM | POA: Diagnosis not present

## 2021-01-11 DIAGNOSIS — J029 Acute pharyngitis, unspecified: Secondary | ICD-10-CM | POA: Diagnosis not present

## 2021-02-07 DIAGNOSIS — G4733 Obstructive sleep apnea (adult) (pediatric): Secondary | ICD-10-CM | POA: Diagnosis not present

## 2021-02-10 DIAGNOSIS — L400 Psoriasis vulgaris: Secondary | ICD-10-CM | POA: Diagnosis not present

## 2021-02-10 DIAGNOSIS — L299 Pruritus, unspecified: Secondary | ICD-10-CM | POA: Diagnosis not present

## 2021-02-12 DIAGNOSIS — H10413 Chronic giant papillary conjunctivitis, bilateral: Secondary | ICD-10-CM | POA: Diagnosis not present

## 2021-02-12 DIAGNOSIS — H0100B Unspecified blepharitis left eye, upper and lower eyelids: Secondary | ICD-10-CM | POA: Diagnosis not present

## 2021-02-12 DIAGNOSIS — H0100A Unspecified blepharitis right eye, upper and lower eyelids: Secondary | ICD-10-CM | POA: Diagnosis not present

## 2021-02-28 DIAGNOSIS — M859 Disorder of bone density and structure, unspecified: Secondary | ICD-10-CM | POA: Diagnosis not present

## 2021-02-28 DIAGNOSIS — I10 Essential (primary) hypertension: Secondary | ICD-10-CM | POA: Diagnosis not present

## 2021-03-05 DIAGNOSIS — I4891 Unspecified atrial fibrillation: Secondary | ICD-10-CM | POA: Diagnosis not present

## 2021-03-05 DIAGNOSIS — Z1331 Encounter for screening for depression: Secondary | ICD-10-CM | POA: Diagnosis not present

## 2021-03-05 DIAGNOSIS — Z1339 Encounter for screening examination for other mental health and behavioral disorders: Secondary | ICD-10-CM | POA: Diagnosis not present

## 2021-03-05 DIAGNOSIS — D6869 Other thrombophilia: Secondary | ICD-10-CM | POA: Diagnosis not present

## 2021-03-05 DIAGNOSIS — G4730 Sleep apnea, unspecified: Secondary | ICD-10-CM | POA: Diagnosis not present

## 2021-03-05 DIAGNOSIS — I10 Essential (primary) hypertension: Secondary | ICD-10-CM | POA: Diagnosis not present

## 2021-03-05 DIAGNOSIS — D692 Other nonthrombocytopenic purpura: Secondary | ICD-10-CM | POA: Diagnosis not present

## 2021-03-05 DIAGNOSIS — I272 Pulmonary hypertension, unspecified: Secondary | ICD-10-CM | POA: Diagnosis not present

## 2021-03-05 DIAGNOSIS — Z0000 Encounter for general adult medical examination without abnormal findings: Secondary | ICD-10-CM | POA: Diagnosis not present

## 2021-05-20 DIAGNOSIS — M8589 Other specified disorders of bone density and structure, multiple sites: Secondary | ICD-10-CM | POA: Diagnosis not present

## 2021-06-20 DIAGNOSIS — R300 Dysuria: Secondary | ICD-10-CM | POA: Diagnosis not present

## 2021-06-20 DIAGNOSIS — N390 Urinary tract infection, site not specified: Secondary | ICD-10-CM | POA: Diagnosis not present

## 2021-07-02 DIAGNOSIS — Z23 Encounter for immunization: Secondary | ICD-10-CM | POA: Diagnosis not present

## 2021-07-02 DIAGNOSIS — S61202A Unspecified open wound of right middle finger without damage to nail, initial encounter: Secondary | ICD-10-CM | POA: Diagnosis not present

## 2021-07-14 DIAGNOSIS — S61202D Unspecified open wound of right middle finger without damage to nail, subsequent encounter: Secondary | ICD-10-CM | POA: Diagnosis not present

## 2021-07-31 DIAGNOSIS — G4733 Obstructive sleep apnea (adult) (pediatric): Secondary | ICD-10-CM | POA: Diagnosis not present

## 2021-09-09 DIAGNOSIS — Z87440 Personal history of urinary (tract) infections: Secondary | ICD-10-CM | POA: Diagnosis not present

## 2021-09-09 DIAGNOSIS — Z6829 Body mass index (BMI) 29.0-29.9, adult: Secondary | ICD-10-CM | POA: Diagnosis not present

## 2021-09-09 DIAGNOSIS — N952 Postmenopausal atrophic vaginitis: Secondary | ICD-10-CM | POA: Diagnosis not present

## 2021-09-09 DIAGNOSIS — Z01419 Encounter for gynecological examination (general) (routine) without abnormal findings: Secondary | ICD-10-CM | POA: Diagnosis not present

## 2021-09-10 DIAGNOSIS — I10 Essential (primary) hypertension: Secondary | ICD-10-CM | POA: Diagnosis not present

## 2021-09-10 DIAGNOSIS — M810 Age-related osteoporosis without current pathological fracture: Secondary | ICD-10-CM | POA: Diagnosis not present

## 2021-10-03 DIAGNOSIS — I4892 Unspecified atrial flutter: Secondary | ICD-10-CM | POA: Diagnosis not present

## 2021-10-03 DIAGNOSIS — Z008 Encounter for other general examination: Secondary | ICD-10-CM | POA: Diagnosis not present

## 2021-10-03 DIAGNOSIS — D6869 Other thrombophilia: Secondary | ICD-10-CM | POA: Diagnosis not present

## 2021-10-03 DIAGNOSIS — I272 Pulmonary hypertension, unspecified: Secondary | ICD-10-CM | POA: Diagnosis not present

## 2021-10-03 DIAGNOSIS — I25119 Atherosclerotic heart disease of native coronary artery with unspecified angina pectoris: Secondary | ICD-10-CM | POA: Diagnosis not present

## 2021-10-03 DIAGNOSIS — I129 Hypertensive chronic kidney disease with stage 1 through stage 4 chronic kidney disease, or unspecified chronic kidney disease: Secondary | ICD-10-CM | POA: Diagnosis not present

## 2021-10-03 DIAGNOSIS — L400 Psoriasis vulgaris: Secondary | ICD-10-CM | POA: Diagnosis not present

## 2021-10-03 DIAGNOSIS — I4891 Unspecified atrial fibrillation: Secondary | ICD-10-CM | POA: Diagnosis not present

## 2021-10-03 DIAGNOSIS — E663 Overweight: Secondary | ICD-10-CM | POA: Diagnosis not present

## 2021-10-03 DIAGNOSIS — J302 Other seasonal allergic rhinitis: Secondary | ICD-10-CM | POA: Diagnosis not present

## 2021-10-03 DIAGNOSIS — G4733 Obstructive sleep apnea (adult) (pediatric): Secondary | ICD-10-CM | POA: Diagnosis not present

## 2021-10-03 DIAGNOSIS — D6859 Other primary thrombophilia: Secondary | ICD-10-CM | POA: Diagnosis not present

## 2021-10-03 DIAGNOSIS — I739 Peripheral vascular disease, unspecified: Secondary | ICD-10-CM | POA: Diagnosis not present

## 2021-10-07 ENCOUNTER — Other Ambulatory Visit (HOSPITAL_COMMUNITY): Payer: Self-pay | Admitting: *Deleted

## 2021-10-08 ENCOUNTER — Inpatient Hospital Stay (HOSPITAL_COMMUNITY): Admission: RE | Admit: 2021-10-08 | Payer: Medicare HMO | Source: Ambulatory Visit

## 2021-10-10 ENCOUNTER — Other Ambulatory Visit (HOSPITAL_COMMUNITY): Payer: Self-pay | Admitting: *Deleted

## 2021-10-13 ENCOUNTER — Ambulatory Visit (HOSPITAL_COMMUNITY)
Admission: RE | Admit: 2021-10-13 | Discharge: 2021-10-13 | Disposition: A | Discharge status: Home / Self Care | Payer: Medicare HMO | Source: Ambulatory Visit | Attending: Internal Medicine | Admitting: Internal Medicine

## 2021-10-13 DIAGNOSIS — M810 Age-related osteoporosis without current pathological fracture: Secondary | ICD-10-CM | POA: Insufficient documentation

## 2021-10-13 MED ORDER — DENOSUMAB 60 MG/ML ~~LOC~~ SOSY
PREFILLED_SYRINGE | SUBCUTANEOUS | Status: AC
  Filled 2021-10-13: qty 1

## 2021-10-13 MED ORDER — DENOSUMAB 60 MG/ML ~~LOC~~ SOSY
60.0000 mg | PREFILLED_SYRINGE | Freq: Once | SUBCUTANEOUS | Status: AC
  Administered 2021-10-13: 60 mg via SUBCUTANEOUS

## 2021-10-14 DIAGNOSIS — L820 Inflamed seborrheic keratosis: Secondary | ICD-10-CM | POA: Diagnosis not present

## 2021-10-14 DIAGNOSIS — L400 Psoriasis vulgaris: Secondary | ICD-10-CM | POA: Diagnosis not present

## 2021-10-14 DIAGNOSIS — L299 Pruritus, unspecified: Secondary | ICD-10-CM | POA: Diagnosis not present

## 2021-11-01 ENCOUNTER — Other Ambulatory Visit: Payer: Self-pay | Admitting: Cardiology

## 2021-11-01 DIAGNOSIS — R600 Localized edema: Secondary | ICD-10-CM

## 2021-11-18 DIAGNOSIS — Z1231 Encounter for screening mammogram for malignant neoplasm of breast: Secondary | ICD-10-CM | POA: Diagnosis not present

## 2021-11-18 DIAGNOSIS — G4733 Obstructive sleep apnea (adult) (pediatric): Secondary | ICD-10-CM | POA: Diagnosis not present

## 2021-11-19 ENCOUNTER — Encounter: Payer: Self-pay | Admitting: Physician Assistant

## 2021-11-19 ENCOUNTER — Ambulatory Visit: Payer: Medicare HMO | Admitting: Physician Assistant

## 2021-11-19 VITALS — BP 130/70 | HR 55 | Ht 65.0 in | Wt 169.2 lb

## 2021-11-19 DIAGNOSIS — Z9989 Dependence on other enabling machines and devices: Secondary | ICD-10-CM

## 2021-11-19 DIAGNOSIS — I10 Essential (primary) hypertension: Secondary | ICD-10-CM

## 2021-11-19 DIAGNOSIS — R600 Localized edema: Secondary | ICD-10-CM

## 2021-11-19 DIAGNOSIS — G4733 Obstructive sleep apnea (adult) (pediatric): Secondary | ICD-10-CM | POA: Diagnosis not present

## 2021-11-19 DIAGNOSIS — I4819 Other persistent atrial fibrillation: Secondary | ICD-10-CM

## 2021-11-19 DIAGNOSIS — L400 Psoriasis vulgaris: Secondary | ICD-10-CM | POA: Diagnosis not present

## 2021-11-19 DIAGNOSIS — L299 Pruritus, unspecified: Secondary | ICD-10-CM | POA: Diagnosis not present

## 2021-11-19 LAB — CBC
Hematocrit: 42.4 % (ref 34.0–46.6)
Hemoglobin: 14 g/dL (ref 11.1–15.9)
MCH: 29.5 pg (ref 26.6–33.0)
MCHC: 33 g/dL (ref 31.5–35.7)
MCV: 90 fL (ref 79–97)
Platelets: 208 10*3/uL (ref 150–450)
RBC: 4.74 x10E6/uL (ref 3.77–5.28)
RDW: 12.6 % (ref 11.7–15.4)
WBC: 5.2 10*3/uL (ref 3.4–10.8)

## 2021-11-19 LAB — BASIC METABOLIC PANEL
BUN/Creatinine Ratio: 24 (ref 12–28)
BUN: 21 mg/dL (ref 8–27)
CO2: 30 mmol/L — ABNORMAL HIGH (ref 20–29)
Calcium: 9.5 mg/dL (ref 8.7–10.3)
Chloride: 101 mmol/L (ref 96–106)
Creatinine, Ser: 0.86 mg/dL (ref 0.57–1.00)
Glucose: 89 mg/dL (ref 70–99)
Potassium: 4.3 mmol/L (ref 3.5–5.2)
Sodium: 143 mmol/L (ref 134–144)
eGFR: 70 mL/min/{1.73_m2} (ref >59)

## 2021-11-19 LAB — MAGNESIUM: Magnesium: 2.3 mg/dL (ref 1.6–2.3)

## 2021-11-19 MED ORDER — FUROSEMIDE 20 MG PO TABS: 20.0000 mg | ORAL_TABLET | Freq: Every day | ORAL | 3 refills | Status: DC

## 2021-11-19 NOTE — Patient Instructions (Addendum)
Medication Instructions:  Your physician has recommended you make the following change in your medication: Take furosemide every day--dose is 20 mg daily *If you need a refill on your cardiac medications before your next appointment, please call your pharmacy*   Lab Work: Lab work to be done today--BMP, Magnesium, CBC If you have labs (blood work) drawn today and your tests are completely normal, you will receive your results only by: [place] (if you have MyChart) OR A paper copy in the mail If you have any lab test that is abnormal or we need to change your treatment, we will call you to review the results.   Testing/Procedures: none   Follow-Up: At [facility], you and your health needs are our priority.  As part of our continuing mission to provide you with exceptional heart care, we have created designated Provider Care Teams.  These Care Teams include your primary Cardiologist (physician) and Advanced Practice Providers (APPs -  Physician Assistants and Nurse Practitioners) who all work together to provide you with the care you need, when you need it.  We recommend signing up for the patient portal called "MyChart".  Sign up information is provided on this After Visit Summary.  MyChart is used to connect with patients for Virtual Visits (Telemedicine).  Patients are able to view lab/test results, encounter notes, upcoming appointments, etc.  Non-urgent messages can be sent to your provider as well.   To learn more about what you can do with MyChart, go to [email or web address].    Your next appointment:   12 month(s)  The format for your next appointment:   In Person  Provider:   [name], MD     Other Instructions    Important Information About Sugar

## 2021-11-19 NOTE — Progress Notes (Signed)
Cardiology Office Note:    Date:  11/19/2021   ID:  TKEYAH Alvarez, DOB 25-Dec-1945, MRN [id number]  PCP:  [name], MD  [facility] Cardiologist:  [name], MD  [place] Electrophysiologist:  None   Chief Complaint: yearly follow up   History of Present Illness:    Erin Alvarez is a 76 y.o. female with a hx of paroxysmal atrial fibrillation (remain on flecainide and Eliquis), chronic diastolic dysfunction, hypertension, rheumatic fever as a child, and sleep apnea on CPAP presents for follow-up.  Normal Myoview 10/2013. Last echocardiogram in March 2020 with LV function of 60 to 97%, grade 2 diastolic dysfunction.  Normal RA and RV pressures. Normal ETT 08/2018. Monitor 02/2020: Sinus bradycardia/normal sinus rhythm.  Last seen by Dr.Turner 10/2020.  Patient is here for follow-up.  She has stable lower extremity edema on Lasix 20 mg daily.  Occasionally need to take extra 20 mg based on edema, especially while traveling.  She tries stocking occasionally.  Patient denies chest pain, shortness of breath, orthopnea, PND, syncope or melena.  She tries to watch her salt.    Past Medical History:  Diagnosis Date   Allergic rhinitis, cause unspecified    Disorder of bone and cartilage, unspecified    Diverticulosis of colon (without mention of hemorrhage)    Elbow fracture, right 12/12   Essential hypertension, benign    Family history of malignant neoplasm of breast    H/O: rheumatic fever    Heart murmur    dx as child - s/p rheumatic fever   Leukocytopenia, unspecified    Lung nodule 06/2009   CT of chest, 4 mm subpleural nodule in the superior segment of the right lower lobe   Mild mitral regurgitation    Mild tricuspid regurgitation    OSA (obstructive sleep apnea)    Severe w AHI 29/hr and successful CPAP now on auto CPAP   Osteopenia    Persistent atrial fibrillation ([place]) 05/04/2012   on systemic anticoagulation w Xarelto for CHADS VASC score of 3    Pneumonia, organism unspecified(486)    Pulmonary HTN ([place])    resolved on echo 10/2015   Sleep apnea    cpap   Unspecified sinusitis (chronic)     Past Surgical History:  Procedure Laterality Date   BREAST BIOPSY Left    biopsy - benign   DILATION AND CURETTAGE OF UTERUS     FOOT SURGERY Left    TONSILLECTOMY     TUBAL LIGATION      Current Medications: Current Meds  Medication Sig   apixaban (ELIQUIS) 5 MG TABS tablet Take 1 tablet (5 mg total) by mouth 2 (two) times daily.   cetirizine (ZYRTEC) 10 MG tablet Take 10 mg by mouth daily as needed. For allergies   diltiazem (CARDIZEM CD) 120 MG 24 hr capsule Take 1 capsule (120 mg total) by mouth daily.   flecainide (TAMBOCOR) 100 MG tablet Take 1 tablet (100 mg total) by mouth 2 (two) times daily.   furosemide (LASIX) 20 MG tablet Take 1 tablet (20 mg total) by mouth daily.   hydrocortisone 2.5 % cream Apply 1 application topically 2 (two) times daily. Apply to lower lip BID   losartan (COZAAR) 50 MG tablet Take 1 tablet (50 mg total) by mouth daily.   Magnesium 400 MG CAPS Take 400 mg by mouth daily.   mometasone (NASONEX) 50 MCG/ACT nasal spray Place 2 sprays into the nose daily as needed (allergies).  Multiple Vitamins-Minerals (CENTRUM PO) Take 1 tablet by mouth daily.     NON FORMULARY CPAP at bedtime   Omega-3 Fatty Acids (FISH OIL) 1200 MG CAPS Take 2 capsules by mouth daily.     [DISCONTINUED] furosemide (LASIX) 20 MG tablet TAKE 1 TABLET ONCE DAILY AS NEEDED FOR FLUID (Patient taking differently: daily.)   Current Facility-Administered Medications for the 11/19/21 encounter (Office Visit) with Leanor Kail, PA  Medication   0.9 %  sodium chloride infusion     Allergies:   Ciprofloxacin and Naproxen   Social History   Socioeconomic History   Marital status: Married    Spouse name: Richard   Number of children: 2   Years of education: Not on file   Highest education level: Not on file  Occupational  History   Occupation: Retired Geophysical data processor: UNEMPLOYED  Tobacco Use   Smoking status: Former    Packs/day: 2.00    Years: 15.00    Pack years: 30.00    Types: Cigarettes    Quit date: 06/22/1974    Years since quitting: 47.4   Smokeless tobacco: Never  Vaping Use   Vaping Use: Never used  Substance and Sexual Activity   Alcohol use: Yes    Alcohol/week: 0.0 standard drinks    Comment: rare wine   Drug use: No   Sexual activity: Not on file  Other Topics Concern   Not on file  Social History Narrative   Married   2 children   Quit tobacco 1976 - smoked 2 ppd x 15   Retired - Emergency planning/management officer   Alcohol use: yes   Son coming in overseas - living in [place] Strain: Not on file  Food Insecurity: Not on file  Transportation Needs: Not on file  Physical Activity: Not on file  Stress: Not on file  Social Connections: Not on file     Family History: The patient's family history includes Brain cancer in her mother; Emphysema in her father; Heart disease (age of onset: 62) in her mother; Hyperlipidemia in her brother and another family member; Hypertension in her brother, father, and another family member; Lung cancer in her mother.    ROS:   Please see the history of present illness.    All other systems reviewed and are negative.   EKGs/Labs/Other Studies Reviewed:    The following studies were reviewed today:  As above   EKG:  EKG is  ordered today.  The ekg ordered today demonstrates sinus bradycardia with prolonged PR interval, right bundle branch block  Recent Labs: 11/19/2020: BUN 19; Creatinine, Ser 0.92; Hemoglobin 13.7; Platelets 244; Potassium 4.1; Sodium 141  Recent Lipid Panel    Component Value Date/Time   CHOL 169 07/03/2010 2029   TRIG 52 07/03/2010 2029   HDL 72 07/03/2010 2029   CHOLHDL 2.3 Ratio 07/03/2010 2029   VLDL 10 07/03/2010 2029   LDLCALC 87 07/03/2010 2029     Risk  Assessment/Calculations:    CHA2DS2-VASc Score = 5   This indicates a 7.2% annual risk of stroke. The patient's score is based upon: CHF History: 1 HTN History: 1 Diabetes History: 0 Stroke History: 0 Vascular Disease History: 0 Age Score: 2 Gender Score: 1      Physical Exam:    VS:  BP 130/70   Pulse (!) 55   Ht '5\' 5"'$  (1.651 m)   Wt 169 lb 3.2 oz (76.7  kg)   SpO2 94%   BMI 28.16 kg/m     Wt Readings from Last 3 Encounters:  11/19/21 169 lb 3.2 oz (76.7 kg)  11/19/20 158 lb 3.2 oz (71.8 kg)  10/31/19 191 lb 12.8 oz (87 kg)     GEN:  Well nourished, well developed in no acute distress HEENT: Normal NECK: No JVD; No carotid bruits LYMPHATICS: No lymphadenopathy CARDIAC: RRR, no murmurs, rubs, gallops RESPIRATORY:  Clear to auscultation without rales, wheezing or rhonchi  ABDOMEN: Soft, non-tender, non-distended MUSCULOSKELETAL:  No edema; No deformity  SKIN: Warm and dry NEUROLOGIC:  Alert and oriented x 3 PSYCHIATRIC:  Normal affect   ASSESSMENT AND PLAN:    Paroxysmal atrial fibrillation Maintaining sinus rhythm.  Continue flecainide, Cardizem and Eliquis.  No bleeding issue.  Check labs.  2.  Chronic diastolic dysfunction -She takes Lasix 20 mg daily.  Intermittent lower extremity edema likely due to dietary noncompliance with salt.  Discussed in detail.  Continue Lasix and losartan.  3.  Hypertension -Blood pressure stable and controlled on current medication.  4. OSA on CPAP - Compliant   Medication Adjustments/Labs and Tests Ordered: Current medicines are reviewed at length with the patient today.  Concerns regarding medicines are outlined above.  Orders Placed This Encounter  Procedures   Basic Metabolic Panel (BMET)   Magnesium   CBC   EKG 12-Lead   Meds ordered this encounter  Medications   furosemide (LASIX) 20 MG tablet    Sig: Take 1 tablet (20 mg total) by mouth daily.    Dispense:  90 tablet    Refill:  3    Dose change--patient  will call when she needs filled    Patient Instructions  Medication Instructions:  Your physician has recommended you make the following change in your medication: Take furosemide every day--dose is 20 mg daily *If you need a refill on your cardiac medications before your next appointment, please call your pharmacy*   Lab Work: Lab work to be done today--BMP, Magnesium, CBC If you have labs (blood work) drawn today and your tests are completely normal, you will receive your results only by: [place] (if you have MyChart) OR A paper copy in the mail If you have any lab test that is abnormal or we need to change your treatment, we will call you to review the results.   Testing/Procedures: none   Follow-Up: At [facility], you and your health needs are our priority.  As part of our continuing mission to provide you with exceptional heart care, we have created designated Provider Care Teams.  These Care Teams include your primary Cardiologist (physician) and Advanced Practice Providers (APPs -  Physician Assistants and Nurse Practitioners) who all work together to provide you with the care you need, when you need it.  We recommend signing up for the patient portal called "MyChart".  Sign up information is provided on this After Visit Summary.  MyChart is used to connect with patients for Virtual Visits (Telemedicine).  Patients are able to view lab/test results, encounter notes, upcoming appointments, etc.  Non-urgent messages can be sent to your provider as well.   To learn more about what you can do with MyChart, go to [email or web address].    Your next appointment:   12 month(s)  The format for your next appointment:   In Person  Provider:   [name], MD     Other Instructions    Important Information About Sugar  Jarrett Soho, [place]  11/19/2021 10:59 AM     [facility]

## 2021-11-23 ENCOUNTER — Other Ambulatory Visit: Payer: Self-pay | Admitting: Cardiology

## 2021-11-24 ENCOUNTER — Other Ambulatory Visit: Payer: Self-pay

## 2021-11-24 ENCOUNTER — Other Ambulatory Visit: Payer: Self-pay | Admitting: Cardiology

## 2021-11-24 MED ORDER — LOSARTAN POTASSIUM 50 MG PO TABS: 50.0000 mg | ORAL_TABLET | Freq: Every day | ORAL | 3 refills | Status: DC

## 2021-11-24 MED ORDER — DILTIAZEM HCL ER COATED BEADS 120 MG PO CP24: 120.0000 mg | ORAL_CAPSULE | Freq: Every day | ORAL | 3 refills | Status: DC

## 2021-11-24 MED ORDER — APIXABAN 5 MG PO TABS: 5.0000 mg | ORAL_TABLET | Freq: Two times a day (BID) | ORAL | 1 refills | Status: DC

## 2021-11-24 NOTE — Telephone Encounter (Signed)
Pt's medications were sent to pt's pharmacy as requested. Confirmation received.  

## 2021-11-24 NOTE — Telephone Encounter (Signed)
*  STAT* If patient is at the pharmacy, call can be transferred to refill team.   1. Which medications need to be refilled? (please list name of each medication and dose if known) flecainide (TAMBOCOR) 100 MG tablet apixaban (ELIQUIS) 5 MG TABS tabletdiltiazem (CARDIZEM CD) 120 MG 24 hr capsule losartan (COZAAR) 50 MG tablet  2. Which pharmacy/location (including street and city if local pharmacy) is medication to be sent to?  [facility] [place] [place] [facility]  3. Do they need a 30 day or 90 day supply?  77  Pt states that this medication may not have been sent to the right place and requesting to have [facility] to [facility] of [facility]

## 2021-11-24 NOTE — Telephone Encounter (Signed)
Prescription refill request for Eliquis received. Indication:  Atrial Fib Last office visit: 11/19/21  B Bhagat PA Scr: 0.86 on 11/19/21 Age: 76 Weight: 76.7kg  Based on above findings Eliquis '5mg'$  twice daily is the appropriate dose.  Refill approved.

## 2021-11-24 NOTE — Telephone Encounter (Signed)
Outpatient Medication Detail   Disp Refills Start End   losartan (COZAAR) 50 MG tablet 90 tablet 3 11/24/2021    Sig - Route: Take 1 tablet (50 mg total) by mouth daily. - Oral   Sent to pharmacy as: losartan (COZAAR) 50 MG tablet   E-Prescribing Status: Receipt confirmed by pharmacy (11/24/2021 11:25 AM EDT)    Pharmacy  [facility] [place] [place]

## 2021-12-10 DIAGNOSIS — Z1211 Encounter for screening for malignant neoplasm of colon: Secondary | ICD-10-CM | POA: Diagnosis not present

## 2021-12-10 DIAGNOSIS — N952 Postmenopausal atrophic vaginitis: Secondary | ICD-10-CM | POA: Diagnosis not present

## 2021-12-10 DIAGNOSIS — R8279 Other abnormal findings on microbiological examination of urine: Secondary | ICD-10-CM | POA: Diagnosis not present

## 2021-12-11 DIAGNOSIS — R531 Weakness: Secondary | ICD-10-CM | POA: Diagnosis not present

## 2021-12-11 DIAGNOSIS — L400 Psoriasis vulgaris: Secondary | ICD-10-CM | POA: Diagnosis not present

## 2021-12-11 DIAGNOSIS — Z79899 Other long term (current) drug therapy: Secondary | ICD-10-CM | POA: Diagnosis not present

## 2021-12-11 DIAGNOSIS — Z111 Encounter for screening for respiratory tuberculosis: Secondary | ICD-10-CM | POA: Diagnosis not present

## 2021-12-17 ENCOUNTER — Encounter: Payer: Self-pay | Admitting: Gastroenterology

## 2021-12-22 ENCOUNTER — Other Ambulatory Visit: Payer: Self-pay | Admitting: Cardiology

## 2022-01-06 DIAGNOSIS — L299 Pruritus, unspecified: Secondary | ICD-10-CM | POA: Diagnosis not present

## 2022-01-06 DIAGNOSIS — L400 Psoriasis vulgaris: Secondary | ICD-10-CM | POA: Diagnosis not present

## 2022-02-02 DIAGNOSIS — R531 Weakness: Secondary | ICD-10-CM | POA: Diagnosis not present

## 2022-02-02 DIAGNOSIS — L400 Psoriasis vulgaris: Secondary | ICD-10-CM | POA: Diagnosis not present

## 2022-02-02 DIAGNOSIS — Z79899 Other long term (current) drug therapy: Secondary | ICD-10-CM | POA: Diagnosis not present

## 2022-02-03 DIAGNOSIS — S80811A Abrasion, right lower leg, initial encounter: Secondary | ICD-10-CM | POA: Diagnosis not present

## 2022-02-03 DIAGNOSIS — S81801A Unspecified open wound, right lower leg, initial encounter: Secondary | ICD-10-CM | POA: Diagnosis not present

## 2022-02-03 DIAGNOSIS — L089 Local infection of the skin and subcutaneous tissue, unspecified: Secondary | ICD-10-CM | POA: Diagnosis not present

## 2022-02-06 DIAGNOSIS — U071 COVID-19: Secondary | ICD-10-CM | POA: Diagnosis not present

## 2022-02-06 DIAGNOSIS — S70211A Abrasion, right hip, initial encounter: Secondary | ICD-10-CM | POA: Diagnosis not present

## 2022-02-16 ENCOUNTER — Encounter: Payer: Self-pay | Admitting: Gastroenterology

## 2022-02-16 DIAGNOSIS — G4733 Obstructive sleep apnea (adult) (pediatric): Secondary | ICD-10-CM | POA: Diagnosis not present

## 2022-03-10 DIAGNOSIS — R233 Spontaneous ecchymoses: Secondary | ICD-10-CM | POA: Diagnosis not present

## 2022-03-10 DIAGNOSIS — L400 Psoriasis vulgaris: Secondary | ICD-10-CM | POA: Diagnosis not present

## 2022-03-19 DIAGNOSIS — G4733 Obstructive sleep apnea (adult) (pediatric): Secondary | ICD-10-CM | POA: Diagnosis not present

## 2022-04-01 ENCOUNTER — Ambulatory Visit: Payer: Medicare HMO | Admitting: Gastroenterology

## 2022-04-01 ENCOUNTER — Telehealth: Payer: Self-pay

## 2022-04-01 ENCOUNTER — Encounter: Payer: Self-pay | Admitting: Gastroenterology

## 2022-04-01 VITALS — BP 128/64 | HR 57 | Ht 65.0 in | Wt 174.0 lb

## 2022-04-01 DIAGNOSIS — Z86010 Personal history of colonic polyps: Secondary | ICD-10-CM | POA: Diagnosis not present

## 2022-04-01 DIAGNOSIS — M810 Age-related osteoporosis without current pathological fracture: Secondary | ICD-10-CM | POA: Diagnosis not present

## 2022-04-01 DIAGNOSIS — R768 Other specified abnormal immunological findings in serum: Secondary | ICD-10-CM

## 2022-04-01 DIAGNOSIS — R7989 Other specified abnormal findings of blood chemistry: Secondary | ICD-10-CM | POA: Diagnosis not present

## 2022-04-01 DIAGNOSIS — I10 Essential (primary) hypertension: Secondary | ICD-10-CM | POA: Diagnosis not present

## 2022-04-01 MED ORDER — NA SULFATE-K SULFATE-MG SULF 17.5-3.13-1.6 GM/177ML PO SOLN: 1.0000 | Freq: Once | ORAL | 0 refills | Status: AC

## 2022-04-01 NOTE — Progress Notes (Signed)
[facility] Consult Note:  History: Erin Alvarez 04/01/2022  Referring provider: Dr. Lenise Herald , [facility] [address], [place], [place] Fax [phone]  Reason for consult/chief complaint: Hepatitis (Patient states that dermatologist referred her here due to testing results of positive hepatitis. Pt states that they are not 100 % sure that the testing is accurate. )   Subjective  HPI:  Erin Alvarez is known to me for a screening colonoscopy in June 2018, at which time a diminutive right colon tubular adenoma was removed.  5-year recall was recommended.  She is referred to me at this time by her dermatologist for concern of a positive hepatitis B test.  That test was done because she is on Tremfaya for psoriasis, medicine which has been very helpful and that she has also continued. She has no no clear risk factors for viral hepatitis, but does say that when she was a child her mother had "hepatitis" and that all family contacts were given immunoglobulin.Erin Alvarez has never been jaundiced.  Erin Alvarez feels well, has not noticed jaundice, has no abdominal pain, she does still tend toward constipation and takes stool softeners with some improvement.  She gets plenty of dietary fiber and water and is working on getting more exercise.  ROS:  Review of Systems  Constitutional:  Negative for appetite change and unexpected weight change.  HENT:  Negative for mouth sores and voice change.   Eyes:  Negative for pain and redness.  Respiratory:  Negative for cough and shortness of breath.   Cardiovascular:  Negative for chest pain and palpitations.  Genitourinary:  Negative for dysuria and hematuria.  Musculoskeletal:  Negative for arthralgias and myalgias.  Skin:  Positive for rash. Negative for pallor.  Neurological:  Negative for weakness and headaches.  Hematological:  Negative for adenopathy.     Past Medical History: Past Medical History:   Diagnosis Date   Allergic rhinitis, cause unspecified    Disorder of bone and cartilage, unspecified    Diverticulosis of colon (without mention of hemorrhage)    Elbow fracture, right 12/12   Essential hypertension, benign    Family history of malignant neoplasm of breast    H/O: rheumatic fever    Heart murmur    dx as child - s/p rheumatic fever   Leukocytopenia, unspecified    Lung nodule 06/2009   CT of chest, 4 mm subpleural nodule in the superior segment of the right lower lobe   Mild mitral regurgitation    Mild tricuspid regurgitation    OSA (obstructive sleep apnea)    Severe w AHI 29/hr and successful CPAP now on auto CPAP   Osteopenia    Persistent atrial fibrillation ([place]) 05/04/2012   on systemic anticoagulation w Xarelto for CHADS VASC score of 3   Pneumonia, organism unspecified(486)    Pulmonary HTN ([place])    resolved on echo 10/2015   Sleep apnea    cpap   Unspecified sinusitis (chronic)      Past Surgical History: Past Surgical History:  Procedure Laterality Date   BREAST BIOPSY Left    biopsy - benign   DILATION AND CURETTAGE OF UTERUS     FOOT SURGERY Left    TONSILLECTOMY     TUBAL LIGATION       Family History: Family History  Problem Relation Age of Onset   Lung cancer Mother        smoker   Heart disease Mother 7  CABG   Brain cancer Mother    Emphysema Father        smoker   Hypertension Father    Hypertension Brother    Hyperlipidemia Brother    Hyperlipidemia Other    Hypertension Other     Social History: Social History   Socioeconomic History   Marital status: Married    Spouse name: Richard   Number of children: 2   Years of education: Not on file   Highest education level: Not on file  Occupational History   Occupation: Retired Geophysical data processor: UNEMPLOYED  Tobacco Use   Smoking status: Former    Packs/day: 2.00    Years: 15.00    Total pack years: 30.00    Types: Cigarettes    Quit date: 06/22/1974     Years since quitting: 47.8   Smokeless tobacco: Never  Vaping Use   Vaping Use: Never used  Substance and Sexual Activity   Alcohol use: Yes    Alcohol/week: 0.0 standard drinks of alcohol    Comment: rare wine   Drug use: No   Sexual activity: Not Currently  Other Topics Concern   Not on file  Social History Narrative   Married   2 children   Quit tobacco 1976 - smoked 2 ppd x 15   Retired - Emergency planning/management officer   Alcohol use: yes   Son coming in overseas - living in [place] Strain: Not on file  Food Insecurity: Not on file  Transportation Needs: Not on file  Physical Activity: Not on file  Stress: Not on file  Social Connections: Not on file    Allergies: Allergies  Allergen Reactions   Ciprofloxacin Rash   Naproxen Rash    Outpatient Meds: Current Outpatient Medications  Medication Sig Dispense Refill   apixaban (ELIQUIS) 5 MG TABS tablet Take 1 tablet (5 mg total) by mouth 2 (two) times daily. 180 tablet 1   Cholecalciferol (VITAMIN D3) 50 MCG (2000 UT) TABS Take 50 mcg by mouth daily.     diltiazem (CARDIZEM CD) 120 MG 24 hr capsule Take 1 capsule (120 mg total) by mouth daily. 90 capsule 3   estradiol (ESTRACE) 0.1 MG/GM vaginal cream Place 1 Applicatorful vaginally once a week.     flecainide (TAMBOCOR) 100 MG tablet TAKE 1 TABLET TWICE A DAY 180 tablet 3   furosemide (LASIX) 20 MG tablet Take 1 tablet (20 mg total) by mouth daily. 90 tablet 3   Guselkumab (TREMFYA) 100 MG/ML SOSY Inject into the skin every 6 (six) weeks.     losartan (COZAAR) 50 MG tablet Take 1 tablet (50 mg total) by mouth daily. 90 tablet 3   Magnesium 400 MG CAPS Take 400 mg by mouth daily.     mometasone (NASONEX) 50 MCG/ACT nasal spray Place 2 sprays into the nose daily as needed (allergies).     Multiple Vitamins-Minerals (CENTRUM PO) Take 1 tablet by mouth daily.       NON FORMULARY CPAP at bedtime     Omega-3 Fatty Acids (FISH OIL)  1200 MG CAPS Take 2 capsules by mouth daily.       Current Facility-Administered Medications  Medication Dose Route Frequency Provider Last Rate Last Admin   0.9 %  sodium chloride infusion  500 mL Intravenous Continuous Danis, Kirke Corin, MD          ___________________________________________________________________ Objective   Exam:  BP 128/64  Pulse (!) 57   Ht '5\' 5"'$  (1.651 m)   Wt 174 lb (78.9 kg)   SpO2 98%   BMI 28.96 kg/m  Wt Readings from Last 3 Encounters:  04/01/22 174 lb (78.9 kg)  11/19/21 169 lb 3.2 oz (76.7 kg)  11/19/20 158 lb 3.2 oz (71.8 kg)    General: Well-appearing Eyes: sclera anicteric, no redness ENT: oral mucosa moist without lesions, no cervical or supraclavicular lymphadenopathy CV: Irregular, no JVD, no peripheral edema Resp: clear to auscultation bilaterally, normal RR and effort noted GI: soft, no tenderness, with active bowel sounds. No guarding or palpable organomegaly noted. Skin: Small dark spots on shins that she says are remnants of her psoriasis. Neuro: awake, alert and oriented x 3. Normal gross motor function and fluent speech  Labs:  Labs from her dermatology practice only became available after Aaralyn had left the office today.  It is an acute hepatitis panel with the following results:  Hepatitis A IgM negative Hepatitis B surface antigen negative Hepatitis B core antibody, IgM positive Hepatitis C antibody negative    Assessment: Encounter Diagnoses  Name Primary?   Hx of colonic polyps Yes   Hepatitis B core antibody positive     History of colon polyps, due for surveillance colonoscopy.  She was agreeable after discussion of procedure and risks.  The benefits and risks of the planned procedure were described in detail with the patient or (when appropriate) their health care proxy.  Risks were outlined as including, but not limited to, bleeding, infection, perforation, adverse medication reaction leading to cardiac or  pulmonary decompensation, pancreatitis (if ERCP).  The limitation of incomplete mucosal visualization was also discussed.  No guarantees or warranties were given.  Last dose of Eliquis 2 evenings prior to procedure.  She is aware of the small but real risk of stroke or other clotting problem during the brief hold of [place].  We will clear this with her cardiologist.  Regarding the main reason for consultation of a positive hepatitis B test, Kyesha does not have chronic hepatitis B since her surface antigen is negative. The positive IgM core antibody is most likely a false positive. She had a family exposure to what may have been a viral hepatitis in her youth, but it was not clearly hepatitis B. No LFTs available today, but soon says she had had labs just that morning her primary care appointment.  Plan:  We will contact her to get the following labs done:  Hepatitis B surface antigen, surface antibody, and total core antibody  Note to our dermatology colleagues: In future screening such as this, it would be best to get the labs I have mentioned in addition to 8 total hepatitis A antibody and hepatitis C antibody. The "acute hepatitis panel" can be misleading or incomplete unless it is being obtained in the setting of suspected acute viral hepatitis.  Thank you for the courtesy of this consult.  Please call me with any questions or concerns.  Nelida Meuse III  CC: Referring provider noted above

## 2022-04-01 NOTE — Telephone Encounter (Signed)
[facility] Pre-operative Risk Assessment     Request for surgical clearance:     Endoscopy Procedure  What type of surgery is being performed?     Colonoscopy  When is this surgery scheduled?     04-28-2022  What type of clearance is required ?   Pharmacy  Are there any medications that need to be held prior to surgery and how long? Eliquis 2 days  Practice name and name of physician performing surgery?      [facility]  What is your office phone and fax number?      Phone- [phone]  [phone]  Anesthesia type (None, local, MAC, general) ?       MAC

## 2022-04-01 NOTE — Patient Instructions (Signed)
_______________________________________________________  If you are age 78 or older, your body mass index should be between 23-30. Your Body mass index is 28.96 kg/m. If this is out of the aforementioned range listed, please consider follow up with your Primary Care Provider.  If you are age 85 or younger, your body mass index should be between 19-25. Your Body mass index is 28.96 kg/m. If this is out of the aformentioned range listed, please consider follow up with your Primary Care Provider.   ________________________________________________________  The [facility] providers would like to encourage you to use [facility] to communicate with providers for non-urgent requests or questions.  Due to long hold times on the telephone, sending your provider a message by [facility] may be a faster and more efficient way to get a response.  Please allow 48 business hours for a response.  Please remember that this is for non-urgent requests.  _______________________________________________________  Erin Alvarez have been scheduled for a colonoscopy. Please follow written instructions given to you at your visit today.  Please pick up your prep supplies at the pharmacy within the next 1-3 days. If you use inhalers (even only as needed), please bring them with you on the day of your procedure.  Due to recent changes in healthcare laws, you may see the results of your imaging and laboratory studies on MyChart before your provider has had a chance to review them.  We understand that in some cases there may be results that are confusing or concerning to you. Not all laboratory results come back in the same time frame and the provider may be waiting for multiple results in order to interpret others.  Please give [place] 48 hours in order for your provider to thoroughly review all the results before contacting the office for clarification of your results.   It was a pleasure to see you today!  Thank you for trusting me with your  gastrointestinal care!

## 2022-04-02 ENCOUNTER — Telehealth: Payer: Self-pay | Admitting: Gastroenterology

## 2022-04-02 DIAGNOSIS — R768 Other specified abnormal immunological findings in serum: Secondary | ICD-10-CM

## 2022-04-02 DIAGNOSIS — Z86010 Personal history of colonic polyps: Secondary | ICD-10-CM

## 2022-04-02 NOTE — Telephone Encounter (Signed)
Please call this patient that I saw in clinic yesterday, tell her I received and reviewed the June labs from the dermatology office, and that she does not have chronic hepatitis B infection.  I will discuss it with her further when I see her for the upcoming colonoscopy.  I would like her to have the following labs drawn, and she can do them the day of her colonoscopy rather than making an additional trip here since she lives in [place]:  Hepatitis B surface antibody Hepatitis B surface antigen Hepatitis B total core antibody  - HD

## 2022-04-08 ENCOUNTER — Other Ambulatory Visit (HOSPITAL_COMMUNITY): Payer: Self-pay

## 2022-04-08 DIAGNOSIS — L409 Psoriasis, unspecified: Secondary | ICD-10-CM | POA: Diagnosis not present

## 2022-04-08 DIAGNOSIS — D84821 Immunodeficiency due to drugs: Secondary | ICD-10-CM | POA: Diagnosis not present

## 2022-04-08 DIAGNOSIS — M810 Age-related osteoporosis without current pathological fracture: Secondary | ICD-10-CM | POA: Diagnosis not present

## 2022-04-08 DIAGNOSIS — G4730 Sleep apnea, unspecified: Secondary | ICD-10-CM | POA: Diagnosis not present

## 2022-04-08 DIAGNOSIS — I272 Pulmonary hypertension, unspecified: Secondary | ICD-10-CM | POA: Diagnosis not present

## 2022-04-08 DIAGNOSIS — D6869 Other thrombophilia: Secondary | ICD-10-CM | POA: Diagnosis not present

## 2022-04-08 DIAGNOSIS — Z0000 Encounter for general adult medical examination without abnormal findings: Secondary | ICD-10-CM | POA: Diagnosis not present

## 2022-04-08 DIAGNOSIS — D692 Other nonthrombocytopenic purpura: Secondary | ICD-10-CM | POA: Diagnosis not present

## 2022-04-08 DIAGNOSIS — I4891 Unspecified atrial fibrillation: Secondary | ICD-10-CM | POA: Diagnosis not present

## 2022-04-08 DIAGNOSIS — I10 Essential (primary) hypertension: Secondary | ICD-10-CM | POA: Diagnosis not present

## 2022-04-09 ENCOUNTER — Ambulatory Visit (HOSPITAL_COMMUNITY)
Admission: RE | Admit: 2022-04-09 | Discharge: 2022-04-09 | Disposition: A | Discharge status: Home / Self Care | Payer: Medicare HMO | Source: Ambulatory Visit | Attending: Internal Medicine | Admitting: Internal Medicine

## 2022-04-09 DIAGNOSIS — M810 Age-related osteoporosis without current pathological fracture: Secondary | ICD-10-CM | POA: Insufficient documentation

## 2022-04-09 MED ORDER — DENOSUMAB 60 MG/ML ~~LOC~~ SOSY
60.0000 mg | PREFILLED_SYRINGE | Freq: Once | SUBCUTANEOUS | Status: AC
  Administered 2022-04-09: 60 mg via SUBCUTANEOUS

## 2022-04-09 MED ORDER — DENOSUMAB 60 MG/ML ~~LOC~~ SOSY
PREFILLED_SYRINGE | SUBCUTANEOUS | Status: AC
  Filled 2022-04-09: qty 1

## 2022-04-10 DIAGNOSIS — H524 Presbyopia: Secondary | ICD-10-CM | POA: Diagnosis not present

## 2022-04-10 DIAGNOSIS — H43813 Vitreous degeneration, bilateral: Secondary | ICD-10-CM | POA: Diagnosis not present

## 2022-04-10 DIAGNOSIS — H04123 Dry eye syndrome of bilateral lacrimal glands: Secondary | ICD-10-CM | POA: Diagnosis not present

## 2022-04-10 DIAGNOSIS — H25013 Cortical age-related cataract, bilateral: Secondary | ICD-10-CM | POA: Diagnosis not present

## 2022-04-10 DIAGNOSIS — H0100A Unspecified blepharitis right eye, upper and lower eyelids: Secondary | ICD-10-CM | POA: Diagnosis not present

## 2022-04-10 DIAGNOSIS — H02402 Unspecified ptosis of left eyelid: Secondary | ICD-10-CM | POA: Diagnosis not present

## 2022-04-10 DIAGNOSIS — H5203 Hypermetropia, bilateral: Secondary | ICD-10-CM | POA: Diagnosis not present

## 2022-04-10 DIAGNOSIS — H2513 Age-related nuclear cataract, bilateral: Secondary | ICD-10-CM | POA: Diagnosis not present

## 2022-04-10 NOTE — Telephone Encounter (Signed)
Patient with diagnosis of atrial fibrillation on Eliquis for anticoagulation.    Procedure: colonoscopy Date of procedure: 04/28/22   CHA2DS2-VASc Score = 5   This indicates a 7.2% annual risk of stroke. The patient's score is based upon: CHF History: 1 HTN History: 1 Diabetes History: 0 Stroke History: 0 Vascular Disease History: 0 Age Score: 2 Gender Score: 1   CrCl 67 Platelet count 208  Per office protocol, patient can hold Eliquis for 2 days prior to procedure.   Patient will not need bridging with Lovenox (enoxaparin) around procedure.  **This guidance is not considered finalized until pre-operative APP has relayed final recommendations.**

## 2022-04-13 ENCOUNTER — Telehealth: Payer: Self-pay | Admitting: *Deleted

## 2022-04-13 NOTE — Telephone Encounter (Signed)
Pt agreeable to plan of care for tele pre op appt 04/23/22 @ 10 am. Med rec and consent are done.     Patient Consent for Virtual Visit        Erin Alvarez has provided verbal consent on 04/13/2022 for a virtual visit (video or telephone).   CONSENT FOR VIRTUAL VISIT FOR:  Erin Alvarez  By participating in this virtual visit I agree to the following:  I hereby voluntarily request, consent and authorize [facility] and its employed or contracted physicians, physician assistants, nurse practitioners or other licensed health care professionals (the Practitioner), to provide me with telemedicine health care services (the "Services") as deemed necessary by the treating Practitioner. I acknowledge and consent to receive the Services by the Practitioner via telemedicine. I understand that the telemedicine visit will involve communicating with the Practitioner through live audiovisual communication technology and the disclosure of certain medical information by electronic transmission. I acknowledge that I have been given the opportunity to request an in-person assessment or other available alternative prior to the telemedicine visit and am voluntarily participating in the telemedicine visit.  I understand that I have the right to withhold or withdraw my consent to the use of telemedicine in the course of my care at any time, without affecting my right to future care or treatment, and that the Practitioner or I may terminate the telemedicine visit at any time. I understand that I have the right to inspect all information obtained and/or recorded in the course of the telemedicine visit and may receive copies of available information for a reasonable fee.  I understand that some of the potential risks of receiving the Services via telemedicine include:  Delay or interruption in medical evaluation due to technological equipment failure or disruption; Information transmitted may not be sufficient  (e.g. poor resolution of images) to allow for appropriate medical decision making by the Practitioner; and/or  In rare instances, security protocols could fail, causing a breach of personal health information.  Furthermore, I acknowledge that it is my responsibility to provide information about my medical history, conditions and care that is complete and accurate to the best of my ability. I acknowledge that Practitioner's advice, recommendations, and/or decision may be based on factors not within their control, such as incomplete or inaccurate data provided by me or distortions of diagnostic images or specimens that may result from electronic transmissions. I understand that the practice of medicine is not an exact science and that Practitioner makes no warranties or guarantees regarding treatment outcomes. I acknowledge that a copy of this consent can be made available to me via my patient portal ([place]), or I can request a printed copy by calling the office of [place].    I understand that my insurance will be billed for this visit.   I have read or had this consent read to me. I understand the contents of this consent, which adequately explains the benefits and risks of the Services being provided via telemedicine.  I have been provided ample opportunity to ask questions regarding this consent and the Services and have had my questions answered to my satisfaction. I give my informed consent for the services to be provided through the use of telemedicine in my medical care

## 2022-04-13 NOTE — Telephone Encounter (Signed)
   Name: Erin Alvarez  DOB: 1945-10-24  MRN: [id number]  Primary Cardiologist: [name], MD   Preoperative team, please contact this patient and set up a phone call appointment for further preoperative risk assessment. Please obtain consent and complete medication review. Thank you for your help.  I confirm that guidance regarding antiplatelet and oral anticoagulation therapy has been completed and, if necessary, noted below.  Per office protocol, patient can hold Eliquis for 2 days prior to procedure.   Patient will not need bridging with Lovenox (enoxaparin) around procedure.   Deberah Pelton, NP 04/13/2022, 8:31 AM [place]

## 2022-04-13 NOTE — Telephone Encounter (Signed)
Pt agreeable to plan of care for tele pre op appt 04/23/22 @ 10 am. Med rec and consent are done.  

## 2022-04-17 DIAGNOSIS — Z0100 Encounter for examination of eyes and vision without abnormal findings: Secondary | ICD-10-CM | POA: Diagnosis not present

## 2022-04-18 DIAGNOSIS — G4733 Obstructive sleep apnea (adult) (pediatric): Secondary | ICD-10-CM | POA: Diagnosis not present

## 2022-04-20 ENCOUNTER — Telehealth: Payer: Self-pay | Admitting: Cardiology

## 2022-04-20 NOTE — Telephone Encounter (Signed)
I s/w the pt and she asked to change her tele 11/2 appt to afternoon. While speaking with the pt she tells me that her BP has been fluctuating up and down. I offered an in office appt instead to check BP. Pt said yes that she would prefer to be seen in office. I have cancelled the 11/2/ tele pre op appt.  I will update the requesting office and PAC for the upcoming appt.

## 2022-04-20 NOTE — Progress Notes (Unsigned)
Cardiology Office Note    Date:  04/22/2022   ID:  Erin Alvarez, DOB 07-02-1945, MRN [id number]  PCP:  [name], MD  Cardiologist:  [name], MD  Electrophysiologist:  None   Chief Complaint: pre-procedure for colonoscopy, also reporting BP issues  History of Present Illness:   Erin Alvarez is a 76 y.o. 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  She was previously seen by Dr. [name] but more recently followed by Dr. [name]. She has history of atypical chest pain with prior normal stress testing. In 2013 she was admitted with new onset atrial fibrillation. She has been on flecainide and Xarelto for many years. Last nuc in 2017 was low risk without obvious ischemia - probable normal perfusion and soft tissue attenuation. Last ETT in 2020 was normal, did not achieve THR (78% similar to prior study).  She carries a prior history of pulm HTN but this subsequently normalized. Last echo in 2020 showed EF 60-65%, pseudonormalization, mild calcification of AV, normal RA/RV pressures. Event monitor in 2021 showed SB/NSR, nonsutained atrial tach (5 beats), rare PACs/PVCs, felt benign. She has a history of occasional LE edema requiring sporadic Lasix. She was last seen in f/u 10/2021 and felt to be doing well.  Cardiology was recently consulted for pre-colonoscopy eval and patient requested in-office appointment due to fluctuating BP.  She is seen for follow-up today reporting the last few weeks she's seen some intermittent BP spikes to the 150s. No CP, SOB, palpitations, dizziness or syncope. She feels her afib has been well controlled. She has gained a few lb of body weight which she attributes to decreased  activity. She continues to have some mild LE edema which she states is at baseline. She uses Lasix only once a week or less, citing that it doesn't really even seem to do much when she does take it. She was on HCTZ previously but this was stopped in the past when she was more regularly taking Lasix.   Labwork independently reviewed: 10/2021 CBC wnl, Mg 2.3, K 4.3, Cr 0.86 2016 TSH wnl   Cardiology Studies:   Studies reviewed are outlined and summarized above. Reports included below if pertinent.   Monitor 02/2020 Sinus bradycardia and normal sinus rhythm. Nonsustained atrial tachycardia occurring for 5 beats at 139bpm. Occasional PACs. Occasional PVCs.  ETT 08/2018 Blood pressure demonstrated a normal response to exercise. There was no ST segment deviation noted during stress.   Normal ETT However patient only achieved 78% of PMHR  Echo 08/2021    1. The left ventricle has normal systolic function with an ejection  fraction of 60-65%. The cavity size was normal. Left ventricular diastolic  Doppler parameters are consistent with pseudonormalization.   2. The right ventricle has normal systolic function. The cavity was  normal. There is no increase in right ventricular wall thickness.   3. Left atrial size was mildly dilated.   4. The aortic valve is tricuspid Mild calcification of the aortic valve.   5. The aortic root and ascending aorta are normal in size and structure.   6. Normal RA and RV pressures.   NST 2017 Nuclear stress EF: 60%. There was no ST segment deviation noted during stress. No T wave inversion was noted  during stress. Defect 1: There is a medium defect of mild severity present in the basal inferoseptal, mid anterior, mid inferoseptal and mid anterolateral location. This is a low risk study. The left ventricular ejection fraction is normal (55-65%). Probable normal perfusion and soft tissue attenuation (breast, diaphragm) No significant ischemia    Past  Medical History:  Diagnosis Date   Allergic rhinitis, cause unspecified    Disorder of bone and cartilage, unspecified    Diverticulosis of colon (without mention of hemorrhage)    Elbow fracture, right 12/12   Essential hypertension, benign    Family history of malignant neoplasm of breast    H/O: rheumatic fever    Heart murmur    dx as child - s/p rheumatic fever   Leukocytopenia, unspecified    Lung nodule 06/2009   CT of chest, 4 mm subpleural nodule in the superior segment of the right lower lobe   Mild mitral regurgitation    Mild tricuspid regurgitation    OSA (obstructive sleep apnea)    Severe w AHI 29/hr and successful CPAP now on auto CPAP   Osteopenia    Persistent atrial fibrillation ([place]) 05/04/2012   on systemic anticoagulation w Xarelto for CHADS VASC score of 3   Pneumonia, organism unspecified(486)    Pulmonary HTN ([place])    resolved on echo 10/2015   Sleep apnea    cpap   Unspecified sinusitis (chronic)     Past Surgical History:  Procedure Laterality Date   BREAST BIOPSY Left    biopsy - benign   DILATION AND CURETTAGE OF UTERUS     FOOT SURGERY Left    TONSILLECTOMY     TUBAL LIGATION      Current Medications: Current Meds  Medication Sig   apixaban (ELIQUIS) 5 MG TABS tablet Take 1 tablet (5 mg total) by mouth 2 (two) times daily.   Cholecalciferol (VITAMIN D3) 50 MCG (2000 UT) TABS Take 50 mcg by mouth daily.   diltiazem (CARDIZEM CD) 120 MG 24 hr capsule Take 1 capsule (120 mg total) by mouth daily.   estradiol (ESTRACE) 0.1 MG/GM vaginal cream Place 1 Applicatorful vaginally once a week.   flecainide (TAMBOCOR) 100 MG tablet TAKE 1 TABLET TWICE A DAY   furosemide (LASIX) 20 MG tablet Take 20 mg by mouth daily as needed for edema.   Guselkumab (TREMFYA) 100 MG/ML SOSY Inject into the skin every 6 (six) weeks.   losartan (COZAAR) 50 MG tablet Take 1 tablet (50 mg total) by mouth daily.   Magnesium 400 MG CAPS Take 400 mg by mouth daily.    mometasone (NASONEX) 50 MCG/ACT nasal spray Place 2 sprays into the nose daily as needed (allergies).   Multiple Vitamins-Minerals (CENTRUM PO) Take 1 tablet by mouth daily.     NON FORMULARY CPAP at bedtime   Omega-3 Fatty Acids (FISH OIL) 1200 MG CAPS Take 2 capsules by mouth daily.          Allergies:   Ciprofloxacin and Naproxen   Social History   Socioeconomic History   Marital status: Married    Spouse name: Richard   Number of children: 2   Years of education: Not on file   Highest education level: Not on file  Occupational History   Occupation: Retired Geophysical data processor: UNEMPLOYED  Tobacco Use   Smoking status: Former    Packs/day: 2.00    Years: 15.00    Total pack years: 30.00    Types: Cigarettes  Quit date: 06/22/1974    Years since quitting: 47.8   Smokeless tobacco: Never  Vaping Use   Vaping Use: Never used  Substance and Sexual Activity   Alcohol use: Yes    Alcohol/week: 0.0 standard drinks of alcohol    Comment: rare wine   Drug use: No   Sexual activity: Not Currently  Other Topics Concern   Not on file  Social History Narrative   Married   2 children   Quit tobacco 1976 - smoked 2 ppd x 15   Retired - Emergency planning/management officer   Alcohol use: yes   Son coming in overseas - living in [place] Strain: Not on file  Food Insecurity: Not on file  Transportation Needs: Not on file  Physical Activity: Not on file  Stress: Not on file  Social Connections: Not on file     Family History:  The patient's family history includes Brain cancer in her mother; Emphysema in her father; Heart disease (age of onset: 31) in her mother; Hyperlipidemia in her brother and another family member; Hypertension in her brother, father, and another family member; Lung cancer in her mother.  ROS:   Please see the history of present illness.  All other systems are reviewed and otherwise negative.    EKG(s)/Additional  Labs   EKG:  EKG is ordered today, personally reviewed, demonstrating SB 58bpm, first degree AVB, RBBB QRS 180m, nonspecific STTW changes, QTC 4311m- otherwise similar to prior  Recent Labs: 11/19/2021: BUN 21; Creatinine, Ser 0.86; Hemoglobin 14.0; Magnesium 2.3; Platelets 208; Potassium 4.3; Sodium 143  Recent Lipid Panel    Component Value Date/Time   CHOL 169 07/03/2010 2029   TRIG 52 07/03/2010 2029   HDL 72 07/03/2010 2029   CHOLHDL 2.3 Ratio 07/03/2010 2029   VLDL 10 07/03/2010 2029   LDLCALC 87 07/03/2010 2029    PHYSICAL EXAM:    VS:  BP (!) 148/68   Pulse (!) 58   Ht '5\' 5"'$  (1.651 m)   Wt 177 lb (80.3 kg)   SpO2 98%   BMI 29.45 kg/m   BMI: Body mass index is 29.45 kg/m.  GEN: Well nourished, well developed female in no acute distress HEENT: normocephalic, atraumatic Neck: no JVD, carotid bruits, or masses Cardiac: RRR; soft SEM, no rubs or gallops, mild soft nonpitting LE ankle edema  Respiratory:  clear to auscultation bilaterally, normal work of breathing GI: soft, nontender, nondistended, + BS MS: no deformity or atrophy Skin: warm and dry, no rash Neuro:  Alert and Oriented x 3, Strength and sensation are intact, follows commands Psych: euthymic mood, full affect  Wt Readings from Last 3 Encounters:  04/22/22 177 lb (80.3 kg)  04/01/22 174 lb (78.9 kg)  11/19/21 169 lb 3.2 oz (76.7 kg)     ASSESSMENT & PLAN:   1. Preop cardiovascular evaluation - Clinically I am not concerned about her CV risk for colonoscopy but I would like to get her blood pressure under better control with follow-up electrolytes before clearing her to proceed. My concern is that if we start chlorthalidone for both her BP and edema, we would need recheck BMET in 1 week around the same time she'd be doing her colon prep which may skew results. She reports colonoscopy is for routine f/u for colon polyps rather than no acute issues, and feels comfortable deferring this a few weeks. I will  route this msg to Dr. DaLoletha Carrow  to make him aware.  2. Essential HTN - suboptimally controlled. Get BMET, TSH today. Start chlorthalidone 12.'5mg'$  daily with BMET in 1 week with office follow-up in 2 weeks. Has gained a little bit of weight since the spring which may be contributing. Continue diltiazem and losartan. Can consider increasing losartan if chlorthalidone does not bring her to goal. Pt denies increase in sodium intake recently.  Also overdue for OSA f/u with Dr. [name], last in 10/2020.  3. Persistent atrial fibrillation, also rare PACs, PVCs, NSAT - maintaining NSR on combination of diltiazem and flecainide. Her EKG shows normal QTC and she had reassuring ETT last in 2020. She has a known first degree AVB and prior IRBBB. This year she now meets criteria for RBBB (QRS >145m) so I will review this with Dr. TRadford Paxin regards to her flecainide. EKG otherwise not acutely changed. Will update echocardiogram since last study was in 2020. She otherwise does not have any symptoms of any breakthrough arrhythmia. Continue Eliquis - dose appropriate. Addendum: Dr. TRadford Paxrecommended I review with EP. I reviewed with DOD Dr. CCurt Bearswho feels the interval duration change is not clinically significant enough at this time to warrant any change to dosing, but he would not suggest increasing flecainide any further in the future if needed.  4. History of lower extremity edema - wonder to what degree her diltiazem is contributing. She had prior fatigue on metoprolol though remote notes state this persisted despite cessation of metoprolol. With her increased BP, I would not change diltiazem acutely right now. Since we are starting back a thiazide diuretic, we will update her Lasix rx to reflect PRN only (which is what she's been doing anyway).  5. RBBB - has had known first degree AVB and IRBBB in the past, now meets criteria for QRS>1218m Will update echocardiogram as above. She has systolic murmur heard on exam, but  also documented in prior visits as well. Denies recent new cardiac symptoms aside from elevated BP. Otherwise see #3.    Disposition: F/u with me in 2 weeks. Also overdue for OSA f/u with Dr. TuRadford Paxlast in 10/2020.   Medication Adjustments/Labs and Tests Ordered: Current medicines are reviewed at length with the patient today.  Concerns regarding medicines are outlined above. Medication changes, Labs and Tests ordered today are summarized above and listed in the Patient Instructions accessible in Encounters.   Signed, DaCharlie PitterPA-C  04/22/2022 3:12 PM    [place]: [phone]: [phone]

## 2022-04-20 NOTE — Telephone Encounter (Signed)
New Message:      Patient would like to reschedule the time for her Pre-Op Phone Visit on 04-23-22 please.

## 2022-04-20 NOTE — Telephone Encounter (Signed)
I s/w the pt and she asked to change her tele 11/2 appt to afternoon. While speaking with the pt she tells me that her BP has been fluctuating up and down. I offered an in office appt instead to check BP. Pt said yes that she would prefer to be seen in office. I have cancelled the 11/2/ tele pre op appt.   I will update the requesting office and PAC for the upcoming appt.

## 2022-04-20 NOTE — Telephone Encounter (Signed)
Thank you for the update.  Please message me and my CMA Magdalene River (included on the message stream) after this patient's cardiology office visit on 04/22/2022.  That way we will know to plan for periprocedural [facility] management for colonoscopy that is scheduled for November 7.   - Wilfrid Lund, MD    [facility]

## 2022-04-21 ENCOUNTER — Encounter: Payer: Self-pay | Admitting: Gastroenterology

## 2022-04-21 DIAGNOSIS — L400 Psoriasis vulgaris: Secondary | ICD-10-CM | POA: Diagnosis not present

## 2022-04-21 DIAGNOSIS — L299 Pruritus, unspecified: Secondary | ICD-10-CM | POA: Diagnosis not present

## 2022-04-22 ENCOUNTER — Ambulatory Visit: Payer: Medicare HMO | Attending: Physician Assistant | Admitting: Physician Assistant

## 2022-04-22 ENCOUNTER — Encounter: Payer: Self-pay | Admitting: Physician Assistant

## 2022-04-22 VITALS — BP 152/72 | HR 58 | Ht 65.0 in | Wt 177.0 lb

## 2022-04-22 DIAGNOSIS — Z01810 Encounter for preprocedural cardiovascular examination: Secondary | ICD-10-CM

## 2022-04-22 DIAGNOSIS — I10 Essential (primary) hypertension: Secondary | ICD-10-CM

## 2022-04-22 DIAGNOSIS — R600 Localized edema: Secondary | ICD-10-CM

## 2022-04-22 DIAGNOSIS — I4510 Unspecified right bundle-branch block: Secondary | ICD-10-CM | POA: Diagnosis not present

## 2022-04-22 DIAGNOSIS — I4819 Other persistent atrial fibrillation: Secondary | ICD-10-CM

## 2022-04-22 MED ORDER — CHLORTHALIDONE 25 MG PO TABS: 12.5000 mg | ORAL_TABLET | Freq: Every day | ORAL | 1 refills | Status: DC

## 2022-04-22 NOTE — Addendum Note (Signed)
Addended by: Ulice Brilliant T on: 04/22/2022 03:32 PM   Modules accepted: Orders

## 2022-04-22 NOTE — Telephone Encounter (Signed)
Hi there! I routed you my OV note today. Erin Alvarez is concerned about getting her BP under control before proceeding with colonoscopy. I plan to add chlorthalidone for both BP + edema, check lytes in 1 week, then see her back in 2 weeks. We felt it would be a little tricky starting the chlorthalidone then doing the colon prep without enough time to make sure her f/u electrolytes are OK with this change. We are also getting an updated echo - less so for any concern about pre-procedure risk, moreso to f/u her structure given RBBB and flecainide use. I will keep you posted at next visit when she gets the all clear. Thank you!

## 2022-04-22 NOTE — Patient Instructions (Addendum)
Medication Instructions:  START Chlorthalidone 12.'5mg'$  (tablet may come in '25mg'$  if so you will have to break tablet in half)  *If you need a refill on your cardiac medications before your next appointment, please call your pharmacy*   Lab Work: TODAY-BMET & TSH BMET 1 WEEK  If you have labs (blood work) drawn today and your tests are completely normal, you will receive your results only by: [place] (if you have MyChart) OR A paper copy in the mail If you have any lab test that is abnormal or we need to change your treatment, we will call you to review the results.   Testing/Procedures: Your physician has requested that you have an echocardiogram. Echocardiography is a painless test that uses sound waves to create images of your heart. It provides your doctor with information about the size and shape of your heart and how well your heart's chambers and valves are working. This procedure takes approximately one hour. There are no restrictions for this procedure. Please do NOT wear cologne, perfume, aftershave, or lotions (deodorant is allowed). Please arrive 15 minutes prior to your appointment time.   Follow-Up: At [facility], you and your health needs are our priority.  As part of our continuing mission to provide you with exceptional heart care, we have created designated Provider Care Teams.  These Care Teams include your primary Cardiologist (physician) and Advanced Practice Providers (APPs -  Physician Assistants and Nurse Practitioners) who all work together to provide you with the care you need, when you need it.  We recommend signing up for the patient portal called "MyChart".  Sign up information is provided on this After Visit Summary.  MyChart is used to connect with patients for Virtual Visits (Telemedicine).  Patients are able to view lab/test results, encounter notes, upcoming appointments, etc.  Non-urgent messages can be sent to your provider as well.   To  learn more about what you can do with MyChart, go to [email or web address].    Your next appointment:   2 week(s)  The format for your next appointment:   In Person  Provider:   Melina Copa, PA-C       SCHEDULE SLEEP FOLLOW UP WITH DR TURNER (FIRST AVAILABLE)  Other Instructions   Important Information About Sugar

## 2022-04-23 ENCOUNTER — Telehealth: Payer: Medicare HMO

## 2022-04-23 LAB — BASIC METABOLIC PANEL
BUN/Creatinine Ratio: 23 (ref 12–28)
BUN: 16 mg/dL (ref 8–27)
CO2: 25 mmol/L (ref 20–29)
Calcium: 9.5 mg/dL (ref 8.7–10.3)
Chloride: 102 mmol/L (ref 96–106)
Creatinine, Ser: 0.71 mg/dL (ref 0.57–1.00)
Glucose: 91 mg/dL (ref 70–99)
Potassium: 4.1 mmol/L (ref 3.5–5.2)
Sodium: 142 mmol/L (ref 134–144)
eGFR: 87 mL/min/{1.73_m2} (ref >59)

## 2022-04-23 LAB — TSH: TSH: 1.93 u[IU]/mL (ref 0.450–4.500)

## 2022-04-23 NOTE — Telephone Encounter (Signed)
Erin Alvarez,  Please see the most recent note from this patient's cardiology provider attached to this message stream.  They are still working on this patient's blood pressure new medication, so this colonoscopy needs to be moved back to sometime in December.  Please contact patient and make the arrangements, though it sounds like this should not be a surprise to her after her visit and discussion with cardiology this week.  - HD

## 2022-04-23 NOTE — Telephone Encounter (Signed)
Patient called and would like to know if can proceed with lab orders since she is no longer getting a colonoscopy

## 2022-04-24 ENCOUNTER — Other Ambulatory Visit: Payer: Self-pay

## 2022-04-24 ENCOUNTER — Telehealth: Payer: Self-pay | Admitting: Cardiology

## 2022-04-24 DIAGNOSIS — R768 Other specified abnormal immunological findings in serum: Secondary | ICD-10-CM

## 2022-04-24 DIAGNOSIS — I10 Essential (primary) hypertension: Secondary | ICD-10-CM

## 2022-04-24 DIAGNOSIS — Z86010 Personal history of colonic polyps: Secondary | ICD-10-CM

## 2022-04-24 NOTE — Telephone Encounter (Signed)
Heart care is requesting [place] to draw a BMP with her hepatitis labs so the patient can avoid having two blood draws

## 2022-04-24 NOTE — Telephone Encounter (Signed)
Spoke to the patient, she has already called and cx her procedure. She would like to call back after the first of the year to reschedule. She will still come one day next week to have her labs drawn.

## 2022-04-24 NOTE — Telephone Encounter (Signed)
Pt would like a callback regarding labs that she is to have done. Please advise

## 2022-04-24 NOTE — Telephone Encounter (Signed)
See other communications on the clearance request. Pt is coming next week to have labs and pt cx her procedure until the new year.

## 2022-04-24 NOTE — Telephone Encounter (Signed)
Returned call to patient.  Patient states she is having labs drawn by [facility] Dr. Loletha Carrow and would like to know if she can have the BMET requested by Melina Copa, PA-C drawn at the same time.  Informed patient I will need to contact Dr. Loletha Carrow' office to see if this can be arranged.  Patient verbalized understanding.  Called Dr. Loletha Carrow' office and left message for nurse to return call.

## 2022-04-24 NOTE — Telephone Encounter (Signed)
Order has been placed and I left a message for the patient to make her aware.

## 2022-04-24 NOTE — Telephone Encounter (Signed)
Yes, that's fine.   Please place the order, and I will forward the result to her Cardiology provider.  - HD

## 2022-04-24 NOTE — Telephone Encounter (Signed)
Inbound call from [facility] with [place]  [facility] with a call back number at [phone].  Calling to inquire if we they can add a lab to patient lab work that needs to be done.  Drue Dun states they would like to add " Basic Metabolic Panel"  Advised her if she could enter the lab and put the drawn center at [facility] then do so if not to please wait on the nurse to give her a call back.  Advised understanding.  Thank you

## 2022-04-24 NOTE — Addendum Note (Signed)
Addended by: Drue Novel I on: 04/24/2022 04:07 PM   Modules accepted: Orders

## 2022-04-28 ENCOUNTER — Encounter: Payer: Medicare HMO | Admitting: Gastroenterology

## 2022-04-29 ENCOUNTER — Other Ambulatory Visit: Payer: Medicare HMO

## 2022-04-29 ENCOUNTER — Other Ambulatory Visit: Payer: Self-pay | Admitting: *Deleted

## 2022-04-29 ENCOUNTER — Other Ambulatory Visit (INDEPENDENT_AMBULATORY_CARE_PROVIDER_SITE_OTHER): Payer: Medicare HMO

## 2022-04-29 DIAGNOSIS — I10 Essential (primary) hypertension: Secondary | ICD-10-CM | POA: Diagnosis not present

## 2022-04-29 DIAGNOSIS — Z86010 Personal history of colonic polyps: Secondary | ICD-10-CM | POA: Diagnosis not present

## 2022-04-29 DIAGNOSIS — R768 Other specified abnormal immunological findings in serum: Secondary | ICD-10-CM

## 2022-04-29 LAB — BASIC METABOLIC PANEL
BUN: 16 mg/dL (ref 6–23)
CO2: 33 mEq/L — ABNORMAL HIGH (ref 19–32)
Calcium: 9.5 mg/dL (ref 8.4–10.5)
Chloride: 100 mEq/L (ref 96–112)
Creatinine, Ser: 0.75 mg/dL (ref 0.40–1.20)
GFR: 76.28 mL/min (ref >60.00)
Glucose, Bld: 98 mg/dL (ref 70–99)
Potassium: 3.4 mEq/L — ABNORMAL LOW (ref 3.5–5.1)
Sodium: 139 mEq/L (ref 135–145)

## 2022-04-30 LAB — HEPATITIS B SURFACE ANTIBODY,QUALITATIVE: Hep B S Ab: NONREACTIVE

## 2022-04-30 LAB — HEPATITIS B CORE ANTIBODY, TOTAL: Hep B Core Total Ab: NONREACTIVE

## 2022-04-30 LAB — HEPATITIS B SURFACE ANTIGEN: Hepatitis B Surface Ag: NONREACTIVE

## 2022-05-01 ENCOUNTER — Telehealth: Payer: Self-pay

## 2022-05-01 MED ORDER — POTASSIUM CHLORIDE CRYS ER 20 MEQ PO TBCR: 40.0000 meq | EXTENDED_RELEASE_TABLET | Freq: Every day | ORAL | 3 refills | Status: DC

## 2022-05-01 NOTE — Telephone Encounter (Signed)
-----   Message from Charlie Pitter, [place] sent at 05/01/2022  7:42 AM EST ----- Triage, please let pt know kidney function stable and find out how BP has been running since starting chlorthalidone. Potassium level slightly decreased on chlorthalidone, not unusual to find. Please start KCl 38mq daily, starting today. On days when she takes PRN Lasix, take an extra 426m - if having to use Lasix more frequently than once a week, needs to let us[place]ow. Please increase dietary intake of healthy sources of potassium including bananas, squash, yogurt, white beans, sweet potatoes, leafy greens, and avocados (if not allergic to these things). Recheck BMET when she returns for her echocardiogram. Will cc to Dr. DaLoletha Carrowo close the loop that we acted on these results. Keep f/u as planned.

## 2022-05-01 NOTE — Telephone Encounter (Signed)
The patient has been notified of the result and verbalized understanding.  All questions (if any) were answered. Bernestine Amass, RN 05/01/2022 2:21 PM   Patient states blood pressure has still been up and down.  Recent readings include 109/60, 106/48, 121/60, 164/83, 139/71, 132/69, 128/65.  She reports swelling has improved and she has not had to take any Lasix.  Potassium has been sent in and patient is aware to take an extra tablet if she takes Lasix. Patient advised on dietary recommendations.

## 2022-05-01 NOTE — Telephone Encounter (Signed)
Spoke with pt and advised of recommendation per Melina Copa, PA-C as below.  Pt verbalizes understanding and agrees with current plan.

## 2022-05-01 NOTE — Telephone Encounter (Addendum)
OK! Please have her check it at least once a day over this weekend at least 3 hours after AM meds and reply Monday afternoon or Tuesday with readings. Notify for any new symptoms.

## 2022-05-04 NOTE — Telephone Encounter (Signed)
Pt is scheduled for repeat BMET due to abnormal lab on 04/29/2022 with GI office.  See Result note for complete details.

## 2022-05-05 ENCOUNTER — Ambulatory Visit: Payer: Medicare HMO

## 2022-05-05 ENCOUNTER — Ambulatory Visit (HOSPITAL_COMMUNITY): Payer: Medicare HMO | Attending: Physician Assistant

## 2022-05-05 DIAGNOSIS — R600 Localized edema: Secondary | ICD-10-CM

## 2022-05-05 DIAGNOSIS — Z01810 Encounter for preprocedural cardiovascular examination: Secondary | ICD-10-CM | POA: Diagnosis not present

## 2022-05-05 DIAGNOSIS — I4819 Other persistent atrial fibrillation: Secondary | ICD-10-CM | POA: Diagnosis not present

## 2022-05-05 DIAGNOSIS — I10 Essential (primary) hypertension: Secondary | ICD-10-CM

## 2022-05-05 DIAGNOSIS — I4510 Unspecified right bundle-branch block: Secondary | ICD-10-CM | POA: Diagnosis not present

## 2022-05-05 LAB — ECHOCARDIOGRAM COMPLETE
Area-P 1/2: 4.44 cm2
MV VTI: 2.07 cm2
S' Lateral: 2.6 cm

## 2022-05-06 LAB — BASIC METABOLIC PANEL
BUN/Creatinine Ratio: 22 (ref 12–28)
BUN: 17 mg/dL (ref 8–27)
CO2: 28 mmol/L (ref 20–29)
Calcium: 9.9 mg/dL (ref 8.7–10.3)
Chloride: 98 mmol/L (ref 96–106)
Creatinine, Ser: 0.76 mg/dL (ref 0.57–1.00)
Glucose: 95 mg/dL (ref 70–99)
Potassium: 3.9 mmol/L (ref 3.5–5.2)
Sodium: 141 mmol/L (ref 134–144)
eGFR: 80 mL/min/{1.73_m2} (ref >59)

## 2022-05-18 ENCOUNTER — Encounter: Payer: Self-pay | Admitting: Physician Assistant

## 2022-05-18 DIAGNOSIS — G4733 Obstructive sleep apnea (adult) (pediatric): Secondary | ICD-10-CM | POA: Diagnosis not present

## 2022-05-18 NOTE — Progress Notes (Unsigned)
Cardiology Office Note    Date:  05/20/2022   ID:  Erin Alvarez, DOB [birth year], MRN [id number]  PCP:  [name], MD  Cardiologist:  [name], MD  Electrophysiologist:  None   Chief Complaint: f/u BP  History of Present Illness:   Erin Alvarez is a 76 y.o. 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   She was previously seen by Dr. [name] but more recently followed by Dr. [name]. She has history of atypical chest pain with prior normal stress testing. In 2013 she was admitted with new onset atrial fibrillation. She has been on flecainide and Xarelto for many years. Last nuc in 2017 was low risk without obvious ischemia - probable normal perfusion and soft tissue attenuation. Last ETT in 2020 was normal, did not achieve THR (78% similar to prior study).  Event monitor in 2021 showed SB/NSR, nonsutained atrial tach (5 beats), rare PACs/PVCs, felt benign. She has a history of occasional LE edema requiring sporadic Lasix. Prior echoes have shown varying degrees of either mild pulm HTN or normal pressures. She was seen in f/u 10/2021 and felt to be doing well. Cardiology was recently contacted for pre-colonoscopy clearance and patient requested in-office appointment due to fluctuating BP prior to proceeding. I saw her 04/22/22 at which time we added chlorthalidone for her BP, in hopes this would also help with her history of ankle edema. She had not had to take Lasix in a long time so we were not planning on dual therapy. We also added low dose potassium in follow-up. I had also reviewed her EKG with Dr. Curt Bears as she now had a RBBB (previously iRBBB) - he felt the interval duration change was not clinically  significant enough at this time to warrant any change to dosing, but he would not suggest increasing flecainide any further in the future if needed. Repeat echo 05/05/22 showed EF 60-65%, G2DD, mildly elevated PASP.  She is seen for follow-up today overall tolerating the med adjustment well. She does report that when she was in [place] recently she had worsening ankle swelling but this resolved without intervention (she had not taken any Lasix with her). She brings in a log of readings which are variable from the 1teens-140s. She is concerned that she still sees spikes 130s-140s ranges regularly. She has not had any symptoms of low blood pressure. She otherwise denies any CP, SOB, palpitations, or any other acute concerns.    Labwork independently reviewed: 05/05/22 K 3.9, Cr 0.76 04/2022 TSH wnl 10/2021 CBC wnl, Mg 2.3   Cardiology Studies:   Studies reviewed are outlined and summarized above. Reports included below if pertinent.   2D echo 04/2022   1. Left ventricular ejection fraction, by estimation, is 60 to 65%. The  left ventricle has normal function. The left ventricle has no regional  wall motion abnormalities. Left ventricular diastolic parameters are  consistent with Grade II diastolic  dysfunction (pseudonormalization).   2. Right ventricular systolic function is normal. The right ventricular  size is normal. There is mildly elevated pulmonary artery systolic  pressure.   3. No evidence of mitral valve regurgitation.   4. The aortic valve is tricuspid. Aortic valve regurgitation is not  visualized.   5. The inferior vena  cava is normal in size with greater than 50%  respiratory variability, suggesting right atrial pressure of 3 mmHg.   Comparison(s): No significant change from prior study.     Past Medical History:  Diagnosis Date   Allergic rhinitis, cause unspecified    Disorder of bone and cartilage, unspecified    Diverticulosis of colon (without mention of  hemorrhage)    Elbow fracture, right 05/2011   Essential hypertension, benign    Family history of malignant neoplasm of breast    H/O: rheumatic fever    Heart murmur    dx as child - s/p rheumatic fever   Leukocytopenia, unspecified    Lung nodule 06/2009   CT of chest, 4 mm subpleural nodule in the superior segment of the right lower lobe   Mild mitral regurgitation    Mild tricuspid regurgitation    OSA (obstructive sleep apnea)    Severe w AHI 29/hr and successful CPAP now on auto CPAP   Osteopenia    Persistent atrial fibrillation ([place]) 05/04/2012   on systemic anticoagulation w Xarelto for CHADS VASC score of 3   Pneumonia, organism unspecified(486)    Pulmonary HTN (HCC)    Sleep apnea    cpap   Unspecified sinusitis (chronic)     Past Surgical History:  Procedure Laterality Date   BREAST BIOPSY Left    biopsy - benign   DILATION AND CURETTAGE OF UTERUS     FOOT SURGERY Left    TONSILLECTOMY     TUBAL LIGATION      Current Medications: Current Meds  Medication Sig   apixaban (ELIQUIS) 5 MG TABS tablet Take 1 tablet (5 mg total) by mouth 2 (two) times daily.   chlorthalidone (HYGROTON) 25 MG tablet Take 0.5 tablets (12.5 mg total) by mouth daily.   Cholecalciferol (VITAMIN D3) 50 MCG (2000 UT) TABS Take 50 mcg by mouth daily.   diltiazem (CARDIZEM CD) 120 MG 24 hr capsule Take 1 capsule (120 mg total) by mouth daily.   estradiol (ESTRACE) 0.1 MG/GM vaginal cream Place 1 Applicatorful vaginally once a week.   flecainide (TAMBOCOR) 100 MG tablet TAKE 1 TABLET TWICE A DAY   furosemide (LASIX) 20 MG tablet Take 20 mg by mouth daily as needed for edema.   Guselkumab (TREMFYA) 100 MG/ML SOSY Inject into the skin every 6 (six) weeks.   losartan (COZAAR) 50 MG tablet Take 1 tablet (50 mg total) by mouth daily.   Magnesium 400 MG CAPS Take 400 mg by mouth daily.   mometasone (NASONEX) 50 MCG/ACT nasal spray Place 2 sprays into the nose daily as needed (allergies).    Multiple Vitamins-Minerals (CENTRUM PO) Take 1 tablet by mouth daily.     NON FORMULARY CPAP at bedtime   Omega-3 Fatty Acids (FISH OIL) 1200 MG CAPS Take 2 capsules by mouth daily.     potassium chloride SA (KLOR-CON M20) 20 MEQ tablet Take 2 tablets (40 mEq total) by mouth daily.    Allergies:   Ciprofloxacin and Naproxen   Social History   Socioeconomic History   Marital status: Married    Spouse name: Richard   Number of children: 2   Years of education: Not on file   Highest education level: Not on file  Occupational History   Occupation: Retired Geophysical data processor: UNEMPLOYED  Tobacco Use   Smoking status: Former    Packs/day: 2.00    Years: 15.00    Total pack years: 30.00  Types: Cigarettes    Quit date: 06/22/1974    Years since quitting: 47.9   Smokeless tobacco: Never  Vaping Use   Vaping Use: Never used  Substance and Sexual Activity   Alcohol use: Yes    Alcohol/week: 0.0 standard drinks of alcohol    Comment: rare wine   Drug use: No   Sexual activity: Not Currently  Other Topics Concern   Not on file  Social History Narrative   Married   2 children   Quit tobacco 1976 - smoked 2 ppd x 15   Retired - Emergency planning/management officer   Alcohol use: yes   Son coming in overseas - living in [place] Strain: Not on file  Food Insecurity: Not on file  Transportation Needs: Not on file  Physical Activity: Not on file  Stress: Not on file  Social Connections: Not on file     Family History:  The patient's family history includes Brain cancer in her mother; Emphysema in her father; Heart disease (age of onset: 36) in her mother; Hyperlipidemia in her brother and another family member; Hypertension in her brother, father, and another family member; Lung cancer in her mother.  ROS:   Please see the history of present illness.  All other systems are reviewed and otherwise negative.    EKG(s)/Additional Labs    EKG:  EKG is not ordered today  Recent Labs: 11/19/2021: Hemoglobin 14.0; Magnesium 2.3; Platelets 208 04/22/2022: TSH 1.930 05/05/2022: BUN 17; Creatinine, Ser 0.76; Potassium 3.9; Sodium 141  Recent Lipid Panel    Component Value Date/Time   CHOL 169 07/03/2010 2029   TRIG 52 07/03/2010 2029   HDL 72 07/03/2010 2029   CHOLHDL 2.3 Ratio 07/03/2010 2029   VLDL 10 07/03/2010 2029   LDLCALC 87 07/03/2010 2029    PHYSICAL EXAM:    VS:  BP 132/70   Pulse (!) 57   Ht '5\' 5"'$  (1.651 m)   Wt 174 lb 9.6 oz (79.2 kg)   SpO2 97%   BMI 29.05 kg/m   BMI: Body mass index is 29.05 kg/m.  GEN: Well nourished, well developed female in no acute distress HEENT: normocephalic, atraumatic Neck: no JVD, carotid bruits, or masses Cardiac: RRR; no murmurs, rubs, or gallops, no edema  Respiratory:  clear to auscultation bilaterally, normal work of breathing GI: soft, nontender, nondistended, + BS MS: no deformity or atrophy Skin: warm and dry, no rash Neuro:  Alert and Oriented x 3, Strength and sensation are intact, follows commands Psych: euthymic mood, full affect  Wt Readings from Last 3 Encounters:  05/20/22 174 lb 9.6 oz (79.2 kg)  04/22/22 177 lb (80.3 kg)  04/01/22 174 lb (78.9 kg)     ASSESSMENT & PLAN:   1. Essential HTN - BP has begun to improve with addition of chlorthalidone. She is still concerned about spikes of 130-140s intermittently. There is no specific pattern to these spikes. We will recheck her BMET today to help guide next steps - could consider titrating losartan slightly to '75mg'$  daily but would not be too aggressive as she is also intermittently seeing normal values as well. Otherwise continue chlorthalidone and diltiazem at present doses.  2. Preop cardiovascular evaluation - now that her BP is better and last BMET was stable, OK to undergo colonoscopy from my standpoint. She plans on pursuing this sometime in January. Per pharmD review from recent notes, patient  can hold Eliquis for 2  days prior to procedure. Patient will not need bridging with Lovenox (enoxaparin) around procedure. I will route this note to Dr. Loletha Carrow and Magdalene River, CMA per our previous communication.  3. Persistent atrial fibrillation, also h/o rare PACs, PVCs, NSAT, with h/o first degree AVB, RBBB - had had known first degree AVB and iRBBB but progressed to regular RBBB as of recent OV. Updated echo did not show any major changes and she's been doing well on present regimen. Last visit I reviewed EKG with Dr. Curt Bears who felt the interval QRS duration change was not clinically significant but he would not suggest increasing flecainide any further in the future if ever needed. Eliquis dose remains appropriate. Will get Mg with labs since we added chlorthalidone. She takes mag citrate OTC supplement to keep herself regular.  4. History of LE edema - no longer present on exam on current regimen. We revisited the conversation to avoid Lasix use unless absolutely necessary given that she is also on chlorthalidone. She fortunately has not had to use this recently. I think her historical distribution of edema is likely dependent in the setting of diltiazem use. However, given stability of her atrial fibrillation, I am not keen on upheaving her antiarrhythmic regimen. We also previously advised that she take an extra 43mq of potassium on days that she takes Lasix.  5. Mild pulmonary HTN - this has been a finding seen on and off echocardiograms over the last 10 years. PASP was 383mg in 2017, for example, now 39.3m78m. Given lack of significant progression or symptoms, follow clinically. She also has appt with Dr. TurRadford Paxheduled for OSA f/u.     Disposition: F/u with Dr. TurRadford Pax 07/2022 as scheduled. She is good about following her blood pressure at home in the meantime and relaying results at our request.   Medication Adjustments/Labs and Tests Ordered: Current medicines are reviewed at length  with the patient today.  Concerns regarding medicines are outlined above. Medication changes, Labs and Tests ordered today are summarized above and listed in the Patient Instructions accessible in Encounters.   Signed, DayCharlie PitterA-C  05/20/2022 2:20 PM    [place]: [phone]: [phone]

## 2022-05-20 ENCOUNTER — Encounter: Payer: Self-pay | Admitting: Physician Assistant

## 2022-05-20 ENCOUNTER — Ambulatory Visit: Payer: Medicare HMO | Attending: Physician Assistant | Admitting: Physician Assistant

## 2022-05-20 VITALS — BP 132/70 | HR 57 | Ht 65.0 in | Wt 174.6 lb

## 2022-05-20 DIAGNOSIS — I4819 Other persistent atrial fibrillation: Secondary | ICD-10-CM

## 2022-05-20 DIAGNOSIS — R600 Localized edema: Secondary | ICD-10-CM | POA: Diagnosis not present

## 2022-05-20 DIAGNOSIS — I272 Pulmonary hypertension, unspecified: Secondary | ICD-10-CM

## 2022-05-20 DIAGNOSIS — I10 Essential (primary) hypertension: Secondary | ICD-10-CM | POA: Diagnosis not present

## 2022-05-20 DIAGNOSIS — Z01810 Encounter for preprocedural cardiovascular examination: Secondary | ICD-10-CM | POA: Diagnosis not present

## 2022-05-20 NOTE — Patient Instructions (Signed)
Medication Instructions:  Your physician recommends that you continue on your current medications as directed. Please refer to the Current Medication list given to you today.  *If you need a refill on your cardiac medications before your next appointment, please call your pharmacy*   Lab Work: Lab work to be done today--BMP and Magnesium If you have labs (blood work) drawn today and your tests are completely normal, you will receive your results only by: [place] (if you have MyChart) OR A paper copy in the mail If you have any lab test that is abnormal or we need to change your treatment, we will call you to review the results.   Testing/Procedures: none   Follow-Up: At [facility], you and your health needs are our priority.  As part of our continuing mission to provide you with exceptional heart care, we have created designated Provider Care Teams.  These Care Teams include your primary Cardiologist (physician) and Advanced Practice Providers (APPs -  Physician Assistants and Nurse Practitioners) who all work together to provide you with the care you need, when you need it.  We recommend signing up for the patient portal called "MyChart".  Sign up information is provided on this After Visit Summary.  MyChart is used to connect with patients for Virtual Visits (Telemedicine).  Patients are able to view lab/test results, encounter notes, upcoming appointments, etc.  Non-urgent messages can be sent to your provider as well.   To learn more about what you can do with MyChart, go to [email or web address].    Your next appointment:   November 26, 2022  The format for your next appointment:   In Person  Provider:   [name], MD     Other Instructions    Important Information About Sugar

## 2022-05-21 ENCOUNTER — Telehealth: Payer: Self-pay

## 2022-05-21 DIAGNOSIS — R600 Localized edema: Secondary | ICD-10-CM

## 2022-05-21 DIAGNOSIS — I10 Essential (primary) hypertension: Secondary | ICD-10-CM

## 2022-05-21 LAB — BASIC METABOLIC PANEL
BUN/Creatinine Ratio: 27 (ref 12–28)
BUN: 23 mg/dL (ref 8–27)
CO2: 28 mmol/L (ref 20–29)
Calcium: 8.9 mg/dL (ref 8.7–10.3)
Chloride: 100 mmol/L (ref 96–106)
Creatinine, Ser: 0.84 mg/dL (ref 0.57–1.00)
Glucose: 92 mg/dL (ref 70–99)
Potassium: 4.2 mmol/L (ref 3.5–5.2)
Sodium: 142 mmol/L (ref 134–144)
eGFR: 71 mL/min/{1.73_m2} (ref >59)

## 2022-05-21 LAB — MAGNESIUM: Magnesium: 2.1 mg/dL (ref 1.6–2.3)

## 2022-05-21 MED ORDER — POTASSIUM CHLORIDE CRYS ER 20 MEQ PO TBCR: 40.0000 meq | EXTENDED_RELEASE_TABLET | Freq: Every day | ORAL | 3 refills | Status: DC

## 2022-05-21 MED ORDER — LOSARTAN POTASSIUM 50 MG PO TABS: 75.0000 mg | ORAL_TABLET | Freq: Every day | ORAL | 3 refills | Status: DC

## 2022-05-21 NOTE — Addendum Note (Signed)
Addended by: Aris Georgia, Famous Eisenhardt L on: 05/21/2022 05:03 PM   Modules accepted: Orders

## 2022-05-21 NOTE — Telephone Encounter (Signed)
The patient has been notified of the result and verbalized understanding.  All questions (if any) were answered. Erin Barter, RN 05/21/2022 4:49 PM    Patient will come in on 05/29/22 for lab work.

## 2022-05-21 NOTE — Telephone Encounter (Signed)
-----   Message from Charlie Pitter, [place] sent at 05/21/2022  7:57 AM EST ----- Please let pt know labs are totally stable. Potassium looks great. At this point I would suggest we go up on losartan to 1.5 tablets ('75mg'$ ) once daily. If the tablets seem too hard to cut we can instead rx three '25mg'$  tablets once daily. Recheck BMET 1 week after increasing. Please relay BP readings at least 3 hours after BP medicines in 1 week. Notify for any med intolerance or low BP. Please also remind her that if she has to take Lasix (which she only takes very sparingly), she will need to take an extra 68mq of KCl that afternoon in addition to her normal dose. We had previously advised her of this but I would like her RX to reflect this in case she ever has to take it.

## 2022-05-22 ENCOUNTER — Other Ambulatory Visit: Payer: Self-pay

## 2022-05-22 DIAGNOSIS — Z86010 Personal history of colonic polyps: Secondary | ICD-10-CM

## 2022-05-27 DIAGNOSIS — J205 Acute bronchitis due to respiratory syncytial virus: Secondary | ICD-10-CM | POA: Diagnosis not present

## 2022-05-27 DIAGNOSIS — J189 Pneumonia, unspecified organism: Secondary | ICD-10-CM | POA: Diagnosis not present

## 2022-05-27 DIAGNOSIS — R509 Fever, unspecified: Secondary | ICD-10-CM | POA: Diagnosis not present

## 2022-05-29 ENCOUNTER — Other Ambulatory Visit: Payer: Medicare HMO

## 2022-06-01 ENCOUNTER — Ambulatory Visit: Payer: Medicare HMO | Attending: Physician Assistant

## 2022-06-01 DIAGNOSIS — R600 Localized edema: Secondary | ICD-10-CM | POA: Diagnosis not present

## 2022-06-01 DIAGNOSIS — I10 Essential (primary) hypertension: Secondary | ICD-10-CM

## 2022-06-02 LAB — BASIC METABOLIC PANEL
BUN/Creatinine Ratio: 22 (ref 12–28)
BUN: 22 mg/dL (ref 8–27)
CO2: 26 mmol/L (ref 20–29)
Calcium: 9.3 mg/dL (ref 8.7–10.3)
Chloride: 98 mmol/L (ref 96–106)
Creatinine, Ser: 1.02 mg/dL — ABNORMAL HIGH (ref 0.57–1.00)
Glucose: 85 mg/dL (ref 70–99)
Potassium: 4.1 mmol/L (ref 3.5–5.2)
Sodium: 140 mmol/L (ref 134–144)
eGFR: 56 mL/min/{1.73_m2} — ABNORMAL LOW (ref >59)

## 2022-06-03 ENCOUNTER — Telehealth: Payer: Self-pay

## 2022-06-03 NOTE — Telephone Encounter (Signed)
-----   Message from Charlie Pitter, [place] sent at 06/02/2022  8:15 PM EST ----- Correction dictation typo below. Surprised to see the change with simple bump in losartan from '50mg'$  to 75# not '25mg'$ .

## 2022-06-04 ENCOUNTER — Other Ambulatory Visit: Payer: Self-pay

## 2022-06-04 DIAGNOSIS — R7989 Other specified abnormal findings of blood chemistry: Secondary | ICD-10-CM

## 2022-06-11 ENCOUNTER — Ambulatory Visit: Payer: Medicare HMO | Attending: Cardiology

## 2022-06-11 DIAGNOSIS — R7989 Other specified abnormal findings of blood chemistry: Secondary | ICD-10-CM

## 2022-06-12 LAB — BASIC METABOLIC PANEL
BUN/Creatinine Ratio: 17 (ref 12–28)
BUN: 15 mg/dL (ref 8–27)
CO2: 27 mmol/L (ref 20–29)
Calcium: 9.6 mg/dL (ref 8.7–10.3)
Chloride: 103 mmol/L (ref 96–106)
Creatinine, Ser: 0.86 mg/dL (ref 0.57–1.00)
Glucose: 96 mg/dL (ref 70–99)
Potassium: 4.2 mmol/L (ref 3.5–5.2)
Sodium: 143 mmol/L (ref 134–144)
eGFR: 69 mL/min/{1.73_m2} (ref >59)

## 2022-06-16 ENCOUNTER — Other Ambulatory Visit: Payer: Self-pay

## 2022-06-17 DIAGNOSIS — G4733 Obstructive sleep apnea (adult) (pediatric): Secondary | ICD-10-CM | POA: Diagnosis not present

## 2022-07-03 ENCOUNTER — Telehealth: Payer: Self-pay | Admitting: Physician Assistant

## 2022-07-03 MED ORDER — SPIRONOLACTONE 25 MG PO TABS: 25.0000 mg | ORAL_TABLET | Freq: Every day | ORAL | 3 refills | Status: DC

## 2022-07-03 NOTE — Telephone Encounter (Signed)
  Pt c/o medication issue:  1. Name of Medication:   potassium chloride SA (KLOR-CON M20) 20 MEQ tablet    furosemide (LASIX) 20 MG tablet    chlorthalidone (HYGROTON) 25 MG tablet   2. How are you currently taking this medication (dosage and times per day)? As written  3. Are you having a reaction (difficulty breathing--STAT)? No   4. What is your medication issue? Pt is requesting to speak with a nurse to  discuss these medications. She said she have some questions. [phone]

## 2022-07-03 NOTE — Telephone Encounter (Signed)
  Charlie Pitter, PA-C 06/16/2022  8:00 AM EST     Reviewed reply re: chlorthalidone. We have two options for her complaint that chlorthalidone is crumbling when she tries to cut it. She can either: a) trial a different pill cutter and continue present doses since labs seemed to be doing well on this dose (let us know how BPs are running) -or- B) stop chlorthalidone, change to spironolactone '25mg'$  once daily, stop daily potassium supplement and go to KCl 59mq daily PRN ONLY when taking Lasix with BMET in 1 week   Let me know what she chooses to do-thanks.    Pt calling today because she received medication in the mail from [facility] with different instructions.  She would like clarification.  Pt received Furosemide 20 mg tablets - to take 1 tablet daily.  Per pt, [facility]  That rx was sent to [facility] 11/19/21.  She has currently only been taking 20 mg as needed.  Advised to continue to taking as needed and not daily based on information above from documentation.   Pt also concerned b/c she can't seem to be able to cut the chlorthalidone in half without it breaking.  Advised (per above orders) to d/c the chlorthalidone and start spironolactone 25 mg a day.   RX sent into [facility] [address] as requested.  Reviewed other medications as listed.  Pt will also take potassium chloride 20 meq as needed with taking her Furosemide.  Pt is aware to call back with any other questions or concerns.  She was appreciative of the call and information.

## 2022-07-09 ENCOUNTER — Encounter: Payer: Self-pay | Admitting: Gastroenterology

## 2022-07-16 ENCOUNTER — Ambulatory Visit (AMBULATORY_SURGERY_CENTER): Payer: Medicare HMO | Admitting: Gastroenterology

## 2022-07-16 ENCOUNTER — Encounter: Payer: Self-pay | Admitting: Gastroenterology

## 2022-07-16 VITALS — BP 120/46 | HR 51 | Temp 98.2°F | Resp 15 | Ht 65.0 in | Wt 174.0 lb

## 2022-07-16 DIAGNOSIS — Z09 Encounter for follow-up examination after completed treatment for conditions other than malignant neoplasm: Secondary | ICD-10-CM

## 2022-07-16 DIAGNOSIS — I10 Essential (primary) hypertension: Secondary | ICD-10-CM | POA: Diagnosis not present

## 2022-07-16 DIAGNOSIS — G4733 Obstructive sleep apnea (adult) (pediatric): Secondary | ICD-10-CM | POA: Diagnosis not present

## 2022-07-16 DIAGNOSIS — Z86010 Personal history of colonic polyps: Secondary | ICD-10-CM | POA: Diagnosis not present

## 2022-07-16 DIAGNOSIS — I4891 Unspecified atrial fibrillation: Secondary | ICD-10-CM | POA: Diagnosis not present

## 2022-07-16 DIAGNOSIS — D122 Benign neoplasm of ascending colon: Secondary | ICD-10-CM | POA: Diagnosis not present

## 2022-07-16 MED ORDER — SODIUM CHLORIDE 0.9 % IV SOLN: 500.0000 mL | Freq: Once | INTRAVENOUS | Status: DC

## 2022-07-16 NOTE — Progress Notes (Signed)
Pt didn't pass air in the RR.  Abdomen soft, non distended.  Pt denies need to pass air and discomfort.  Told to ambulate and drink warm fluids at home if need, understanding voiced

## 2022-07-16 NOTE — Progress Notes (Signed)
Called to room to assist during endoscopic procedure.  Patient ID and intended procedure confirmed with present staff. Received instructions for my participation in the procedure from the performing physician.  

## 2022-07-16 NOTE — Patient Instructions (Addendum)
Resume previous diet. Resume Eliquis (apixaban) at prior dose today. Await pathology results No repeat surveillance colonoscopy recommended due to age, low risk polyp history and current guidelines. Please read over handouts about polyps and diverticulosis  YOU HAD AN ENDOSCOPIC PROCEDURE TODAY AT [facility]:   Refer to the procedure report that was given to you for any specific questions about what was found during the examination.  If the procedure report does not answer your questions, please call your gastroenterologist to clarify.  If you requested that your care partner not be given the details of your procedure findings, then the procedure report has been included in a sealed envelope for you to review at your convenience later.  YOU SHOULD EXPECT: Some feelings of bloating in the abdomen. Passage of more gas than usual.  Walking can help get rid of the air that was put into your GI tract during the procedure and reduce the bloating. If you had a lower endoscopy (such as a colonoscopy or flexible sigmoidoscopy) you may notice spotting of blood in your stool or on the toilet paper. If you underwent a bowel prep for your procedure, you may not have a normal bowel movement for a few days.  Please Note:  You might notice some irritation and congestion in your nose or some drainage.  This is from the oxygen used during your procedure.  There is no need for concern and it should clear up in a day or so.  SYMPTOMS TO REPORT IMMEDIATELY:  Following lower endoscopy (colonoscopy or flexible sigmoidoscopy):  Excessive amounts of blood in the stool  Significant tenderness or worsening of abdominal pains  Swelling of the abdomen that is new, acute  Fever of 100F or higher  For urgent or emergent issues, a gastroenterologist can be reached at any hour by calling [phone]. Do not use MyChart messaging for urgent concerns.    DIET:  We do recommend a small meal at first, but  then you may proceed to your regular diet.  Drink plenty of fluids but you should avoid alcoholic beverages for 24 hours.  ACTIVITY:  You should plan to take it easy for the rest of today and you should NOT DRIVE or use heavy machinery until tomorrow (because of the sedation medicines used during the test).    FOLLOW UP: Our staff will call the number listed on your records the next business day following your procedure.  We will call around 7:15- 8:00 am to check on you and address any questions or concerns that you may have regarding the information given to you following your procedure. If we do not reach you, we will leave a message.     If any biopsies were taken you will be contacted by phone or by letter within the next 1-3 weeks.  Please call us at [phone] if you have not heard about the biopsies in 3 weeks.    SIGNATURES/CONFIDENTIALITY: You and/or your care partner have signed paperwork which will be entered into your electronic medical record.  These signatures attest to the fact that that the information above on your After Visit Summary has been reviewed and is understood.  Full responsibility of the confidentiality of this discharge information lies with you and/or your care-partner.

## 2022-07-16 NOTE — Progress Notes (Signed)
History and Physical:  This patient presents for endoscopic testing for: Encounter Diagnosis  Name Primary?   Hx of colonic polyps Yes    Diminutive TA June 2018 Last office visit Oct 2023 with question of HBV infection (which she did not have)  Patient is otherwise without complaints or active issues today.   Past Medical History: Past Medical History:  Diagnosis Date   Allergic rhinitis, cause unspecified    Allergy    Cataract    Disorder of bone and cartilage, unspecified    Diverticulosis of colon (without mention of hemorrhage)    Elbow fracture, right 05/2011   Essential hypertension, benign    Family history of malignant neoplasm of breast    H/O: rheumatic fever    Heart murmur    dx as child - s/p rheumatic fever   Leukocytopenia, unspecified    Lung nodule 06/2009   CT of chest, 4 mm subpleural nodule in the superior segment of the right lower lobe   Mild mitral regurgitation    Mild tricuspid regurgitation    OSA (obstructive sleep apnea)    Severe w AHI 29/hr and successful CPAP now on auto CPAP   Osteopenia    Osteoporosis    Persistent atrial fibrillation ([place]) 05/04/2012   on systemic anticoagulation w Xarelto for CHADS VASC score of 3   Pneumonia, organism unspecified(486)    Pulmonary HTN (HCC)    Sleep apnea    cpap   Unspecified sinusitis (chronic)      Past Surgical History: Past Surgical History:  Procedure Laterality Date   BREAST BIOPSY Left    biopsy - benign   DILATION AND CURETTAGE OF UTERUS     FOOT SURGERY Left    TONSILLECTOMY     TUBAL LIGATION      Allergies: Allergies  Allergen Reactions   Ciprofloxacin Rash   Naproxen Rash    Outpatient Meds: Current Outpatient Medications  Medication Sig Dispense Refill   Cholecalciferol (VITAMIN D3) 50 MCG (2000 UT) TABS Take 50 mcg by mouth daily.     diltiazem (CARDIZEM CD) 120 MG 24 hr capsule Take 1 capsule (120 mg total) by mouth daily. 90 capsule 3   estradiol (ESTRACE) 0.1  MG/GM vaginal cream Place 1 Applicatorful vaginally once a week.     flecainide (TAMBOCOR) 100 MG tablet TAKE 1 TABLET TWICE A DAY 180 tablet 3   furosemide (LASIX) 20 MG tablet Take 20 mg by mouth daily as needed for edema.     losartan (COZAAR) 50 MG tablet Take 1.5 tablets (75 mg total) by mouth daily. 135 tablet 3   Magnesium 400 MG CAPS Take 400 mg by mouth daily.     Multiple Vitamins-Minerals (CENTRUM PO) Take 1 tablet by mouth daily.       NON FORMULARY CPAP at bedtime     Omega-3 Fatty Acids (FISH OIL) 1200 MG CAPS Take 2 capsules by mouth daily.       spironolactone (ALDACTONE) 25 MG tablet Take 1 tablet (25 mg total) by mouth daily. 90 tablet 3   apixaban (ELIQUIS) 5 MG TABS tablet Take 1 tablet (5 mg total) by mouth 2 (two) times daily. 180 tablet 1   Guselkumab (TREMFYA) 100 MG/ML SOSY Inject into the skin every 6 (six) weeks.     mometasone (NASONEX) 50 MCG/ACT nasal spray Place 2 sprays into the nose daily as needed (allergies).     potassium chloride SA (KLOR-CON M20) 20 MEQ tablet Take 2 tablets (40  mEq total) by mouth daily. Take extra 2 tablet by mouth with furosemide when needed. (Patient taking differently: Take 20 mEq by mouth daily as needed.) 180 tablet 3   Current Facility-Administered Medications  Medication Dose Route Frequency Provider Last Rate Last Admin   0.9 %  sodium chloride infusion  500 mL Intravenous Continuous Danis, Estill Cotta III, MD       0.9 %  sodium chloride infusion  500 mL Intravenous Once Danis, Kirke Corin, MD          ___________________________________________________________________ Objective   Exam:  BP 125/61   Pulse (!) 55   Temp 98.2 F (36.8 C) (Skin)   Ht '5\' 5"'$  (1.651 m)   Wt 174 lb (78.9 kg)   SpO2 96%   BMI 28.96 kg/m   CV: regular , S1/S2 (NSR on monitor) Resp: clear to auscultation bilaterally, normal RR and effort noted GI: soft, no tenderness, with active bowel sounds.   Assessment: Encounter Diagnosis  Name  Primary?   Hx of colonic polyps Yes     Plan: Colonoscopy  The benefits and risks of the planned procedure were described in detail with the patient or (when appropriate) their health care proxy.  Risks were outlined as including, but not limited to, bleeding, infection, perforation, adverse medication reaction leading to cardiac or pulmonary decompensation, pancreatitis (if ERCP).  The limitation of incomplete mucosal visualization was also discussed.  No guarantees or warranties were given.    The patient is appropriate for an endoscopic procedure in the ambulatory setting.   - Wilfrid Lund, MD

## 2022-07-16 NOTE — Op Note (Signed)
[place] Patient Name: Druanne Bosques Procedure Date: 07/16/2022 11:02 AM MRN: [id number] Endoscopist: Mallie Mussel L. Loletha Carrow , MD, [id number] Age: 77 Referring MD:  Date of Birth: 03-17-1946 Gender: Female Account #: [id number] Procedure:                Colonoscopy Indications:              Surveillance: Personal history of adenomatous                            polyps on last colonoscopy > 5 years ago                           diminutive TA in ascending colon June 2018 Medicines:                Monitored Anesthesia Care Procedure:                Pre-Anesthesia Assessment:                           - Prior to the procedure, a History and Physical                            was performed, and patient medications and                            allergies were reviewed. The patient's tolerance of                            previous anesthesia was also reviewed. The risks                            and benefits of the procedure and the sedation                            options and risks were discussed with the patient.                            All questions were answered, and informed consent                            was obtained. Prior Anticoagulants: The patient has                            taken Eliquis (apixaban), last dose was 2 days                            prior to procedure. ASA Grade Assessment: III - A                            patient with severe systemic disease. After                            reviewing the risks and benefits, the patient was  deemed in satisfactory condition to undergo the                            procedure.                           After obtaining informed consent, the colonoscope                            was passed under direct vision. Throughout the                            procedure, the patient's blood pressure, pulse, and                            oxygen saturations were monitored continuously. The                             Olympus Scope [id number] was introduced through the                            anus and advanced to the the cecum, identified by                            appendiceal orifice and ileocecal valve. The                            colonoscopy was somewhat difficult due to multiple                            diverticula in the colon and a redundant colon.                            Successful completion of the procedure was aided by                            using manual pressure and straightening and                            shortening the scope to obtain bowel loop                            reduction. The patient tolerated the procedure                            well. The quality of the bowel preparation was                            good. The ileocecal valve, appendiceal orifice, and                            rectum were photographed. The bowel preparation  used was SUPREP via split dose instruction. Scope In: 11:19:15 AM Scope Out: 85:02:77 AM Scope Withdrawal Time: 0 hours 8 minutes 16 seconds  Total Procedure Duration: 0 hours 17 minutes 31 seconds  Findings:                 The perianal and digital rectal examinations were                            normal except decreased resting sphincter tone. No                            hemorrhoids seen. Redundant skin folds.                           Multiple diverticula were found in the left colon                            and right colon.                           Repeat examination of right colon under NBI                            performed.                           A diminutive polyp was found in the ascending                            colon. The polyp was sessile. The polyp was removed                            with a cold snare. Resection and retrieval were                            complete.                           The exam was otherwise without abnormality on                             direct and retroflexion views. Complications:            No immediate complications. Estimated Blood Loss:     Estimated blood loss was minimal. Impression:               - Diverticulosis in the left colon and in the right                            colon.                           - One diminutive polyp in the ascending colon,                            removed with a cold snare. Resected and retrieved.                           -  The examination was otherwise normal on direct                            and retroflexion views. Recommendation:           - Patient has a contact number available for                            emergencies. The signs and symptoms of potential                            delayed complications were discussed with the                            patient. Return to normal activities tomorrow.                            Written discharge instructions were provided to the                            patient.                           - Resume previous diet.                           - Resume Eliquis (apixaban) at prior dose today.                           - Await pathology results.                           - No repeat surveillance colonoscopy recommended                            due to age, low risk polyp history and current                            guidelines. Jerusalem Brownstein L. Loletha Carrow, MD 07/16/2022 11:42:11 AM This report has been signed electronically.

## 2022-07-17 ENCOUNTER — Telehealth: Payer: Self-pay | Admitting: *Deleted

## 2022-07-17 NOTE — Telephone Encounter (Signed)
  Follow up Call-     07/16/2022   10:48 AM  Call back number  Post procedure Call Back phone  # [phone]  Permission to leave phone message Yes    [facility]

## 2022-07-18 DIAGNOSIS — G4733 Obstructive sleep apnea (adult) (pediatric): Secondary | ICD-10-CM | POA: Diagnosis not present

## 2022-07-21 ENCOUNTER — Encounter: Payer: Self-pay | Admitting: Gastroenterology

## 2022-07-22 NOTE — Progress Notes (Unsigned)
Virtual Visit via Video Note   Because of Aleiyah Halpin Bergfeld's co-morbid illnesses, she is at least at moderate risk for complications without adequate follow up.  This format is felt to be most appropriate for this patient at this time.  All issues noted in this document were discussed and addressed.  A limited physical exam was performed with this format.  Please refer to the patient's chart for her consent to telehealth for [facility].       Date:  07/23/2022   ID:  Erin Alvarez, DOB Mar 04, 1946, MRN [id number] The patient was identified using 2 identifiers.  Patient Location: Home Provider Location: Home Office   PCP:  [name], [place] [facility] Cardiologist:  [name], MD     Evaluation Performed:  Follow-Up Visit  Chief Complaint:  PAF, OSA, HTN  History of Present Illness:    Erin Alvarez is a 77 y.o. female with a hx of atypical CP and subsequent stress echo with no ischemia, rheumatic fever as a child,  grade 2 diastolic dysfunction by echo and persistent atrial fibrillation (first diagnosed in 2013) and has been on Eliquis for a CHADS2Vasc score of 3, Flecainide and Cardizem. She has severe OSA with an initial AHI of 29/hr on her sleep study and is on CPAP and last OV her PAP was decreased to 4-10cm H2O due to dry mouth.   She is here today for followup and is doing well.  She denies any chest pain or pressure, SOB, DOE, PND, orthopnea, dizziness, palpitations or syncope. Occasionally she will have some mild ankle edema but not bad. She is compliant with her meds and is tolerating meds with no SE.    She is doing well with her PAP device.  She tolerates the nasal cushion mask and uses a chin strap but her mouth still goes open. SHe has tried the full face mask and cannot use it.  She feels the pressure is very strong as it increases and then cannot keep her mouth closed.  She feels rested in the am for the most part but her mouth is  opening so much with dry mouth and so sometimes she feels tired during the day and has to nap after lunch.   Past Medical History:  Diagnosis Date   Allergic rhinitis, cause unspecified    Allergy    Cataract    Disorder of bone and cartilage, unspecified    Diverticulosis of colon (without mention of hemorrhage)    Elbow fracture, right 05/2011   Essential hypertension, benign    Family history of malignant neoplasm of breast    H/O: rheumatic fever    Heart murmur    dx as child - s/p rheumatic fever   Leukocytopenia, unspecified    Lung nodule 06/2009   CT of chest, 4 mm subpleural nodule in the superior segment of the right lower lobe   Mild mitral regurgitation    Mild tricuspid regurgitation    OSA (obstructive sleep apnea)    Severe w AHI 29/hr and successful CPAP now on auto CPAP   Osteopenia    Osteoporosis    Persistent atrial fibrillation ([place]) 05/04/2012   on systemic anticoagulation w Xarelto for CHADS VASC score of 3   Pneumonia, organism unspecified(486)    Pulmonary HTN (HCC)    Sleep apnea    cpap   Unspecified sinusitis (chronic)    Past Surgical History:  Procedure Laterality Date  BREAST BIOPSY Left    biopsy - benign   DILATION AND CURETTAGE OF UTERUS     FOOT SURGERY Left    TONSILLECTOMY     TUBAL LIGATION       Current Meds  Medication Sig   apixaban (ELIQUIS) 5 MG TABS tablet Take 1 tablet (5 mg total) by mouth 2 (two) times daily.   Cholecalciferol (VITAMIN D3) 50 MCG (2000 UT) TABS Take 50 mcg by mouth daily.   diltiazem (CARDIZEM CD) 120 MG 24 hr capsule Take 1 capsule (120 mg total) by mouth daily.   estradiol (ESTRACE) 0.1 MG/GM vaginal cream Place 1 Applicatorful vaginally once a week.   flecainide (TAMBOCOR) 100 MG tablet TAKE 1 TABLET TWICE A DAY   furosemide (LASIX) 20 MG tablet Take 20 mg by mouth daily as needed for edema.   Guselkumab (TREMFYA) 100 MG/ML SOSY Inject into the skin every 6 (six) weeks.   losartan (COZAAR) 50 MG  tablet Take 1.5 tablets (75 mg total) by mouth daily.   Magnesium 400 MG CAPS Take 400 mg by mouth daily.   mometasone (NASONEX) 50 MCG/ACT nasal spray Place 2 sprays into the nose daily as needed (allergies).   Multiple Vitamins-Minerals (CENTRUM PO) Take 1 tablet by mouth daily.     NON FORMULARY CPAP at bedtime   Omega-3 Fatty Acids (FISH OIL) 1200 MG CAPS Take 2 capsules by mouth daily.     potassium chloride SA (KLOR-CON M20) 20 MEQ tablet Take 2 tablets (40 mEq total) by mouth daily. Take extra 2 tablet by mouth with furosemide when needed. (Patient taking differently: Take 20 mEq by mouth daily as needed.)   spironolactone (ALDACTONE) 25 MG tablet Take 1 tablet (25 mg total) by mouth daily.     Allergies:   Ciprofloxacin and Naproxen   Social History   Tobacco Use   Smoking status: Former    Packs/day: 2.00    Years: 15.00    Total pack years: 30.00    Types: Cigarettes    Quit date: 06/22/1974    Years since quitting: 48.1   Smokeless tobacco: Never  Vaping Use   Vaping Use: Never used  Substance Use Topics   Alcohol use: Yes    Alcohol/week: 0.0 standard drinks of alcohol    Comment: rare wine   Drug use: No     Family Hx: The patient's family history includes Brain cancer in her mother; Colon cancer in her maternal aunt; Emphysema in her father; Heart disease (age of onset: 67) in her mother; Hyperlipidemia in her brother and another family member; Hypertension in her brother, father, and another family member; Lung cancer in her mother. There is no history of Stomach cancer or Rectal cancer.  ROS:   Please see the history of present illness.     All other systems reviewed and are negative.   Prior CV studies:   The following studies were reviewed today:  PAP compliance download  Labs/Other Tests and Data Reviewed:    EKG:  No ECG reviewed.  Recent Labs: 11/19/2021: Hemoglobin 14.0; Platelets 208 04/22/2022: TSH 1.930 05/20/2022: Magnesium 2.1 06/11/2022:  BUN 15; Creatinine, Ser 0.86; Potassium 4.2; Sodium 143   Recent Lipid Panel Lab Results  Component Value Date/Time   CHOL 169 07/03/2010 08:29 PM   TRIG 52 07/03/2010 08:29 PM   HDL 72 07/03/2010 08:29 PM   CHOLHDL 2.3 Ratio 07/03/2010 08:29 PM   LDLCALC 87 07/03/2010 08:29 PM    Wt Readings from  Last 3 Encounters:  07/23/22 173 lb (78.5 kg)  07/16/22 174 lb (78.9 kg)  05/20/22 174 lb 9.6 oz (79.2 kg)     Risk Assessment/Calculations:    CHA2DS2-VASc Score = 5   This indicates a 7.2% annual risk of stroke. The patient's score is based upon: CHF History: 1 HTN History: 1 Diabetes History: 0 Stroke History: 0 Vascular Disease History: 0 Age Score: 2 Gender Score: 1         Objective:    Vital Signs:  BP (!) 116/58   Pulse 63   Ht '5\' 5"'$  (1.651 m)   Wt 173 lb (78.5 kg)   BMI 28.79 kg/m    VITAL SIGNS:  reviewed GEN:  no acute distress EYES:  sclerae anicteric, EOMI - Extraocular Movements Intact RESPIRATORY:  normal respiratory effort, symmetric expansion CARDIOVASCULAR:  no peripheral edema SKIN:  no rash, lesions or ulcers. MUSCULOSKELETAL:  no obvious deformities. NEURO:  alert and oriented x 3, no obvious focal deficit PSYCH:  normal affect  ASSESSMENT & PLAN:    1.  Persistent atrial fibrillation -She is maintaining NSR on exam and denies any palpitations -She denies any bleeding issues on DOAC -continue prescription drug management with Eliquis '5mg'$  BID, Cardizem CD '120mg'$  daily and Flecainide '100mg'$  BID with PRN refills -I have personally reviewed and interpreted outside labs performed by patient's PCP which showed SCr 0.86 on 06/11/22 and Hbg 14 on 11/19/21   2.  HTN -BP controlled -continue prescription drug management with Losartan '50mg'$  daily and Cardizem CD '120mg'$  daily with PRN refills   3.  Pulmonary HTN -this was resolved on last 2D echo   4.  LE edema -remains controlled on diuretics   5.  OSA - The patient is tolerating PAP therapy well  without any problems. The PAP download performed by his DME was personally reviewed and interpreted by me today and showed an AHI of 2.6/hr on auto CPAP from 4 to 10 cm H2O with 100% compliance in using more than 4 hours nightly.  The patient has been using and benefiting from PAP use and will continue to benefit from therapy.  -she is having problems with the auto pressure.  She also has issues with her mouth opening even with the chin strap -I will order her a new chin strap and encouraged her to put it as tight as possible. -I will change her from auto CPAP to a set pressure of 8cm H2O and get a download in 4 weeks -she will call me if she does not like the set pressure of 8cm H2O   Time:   Today, I have spent 15 minutes with the patient with telehealth technology discussing the above problems.     Medication Adjustments/Labs and Tests Ordered: Current medicines are reviewed at length with the patient today.  Concerns regarding medicines are outlined above.   Tests Ordered: No orders of the defined types were placed in this encounter.   Medication Changes: No orders of the defined types were placed in this encounter.   Follow Up: 2 months virtual for followup of CPAP changes  Signed, [name], MD  07/23/2022 9:30 AM    [place]

## 2022-07-23 ENCOUNTER — Ambulatory Visit: Payer: Medicare HMO | Attending: Cardiology | Admitting: Cardiology

## 2022-07-23 ENCOUNTER — Encounter: Payer: Self-pay | Admitting: Cardiology

## 2022-07-23 ENCOUNTER — Telehealth: Payer: Self-pay | Admitting: *Deleted

## 2022-07-23 VITALS — BP 116/58 | HR 63 | Ht 65.0 in | Wt 173.0 lb

## 2022-07-23 DIAGNOSIS — G4733 Obstructive sleep apnea (adult) (pediatric): Secondary | ICD-10-CM | POA: Diagnosis not present

## 2022-07-23 DIAGNOSIS — I272 Pulmonary hypertension, unspecified: Secondary | ICD-10-CM | POA: Diagnosis not present

## 2022-07-23 DIAGNOSIS — R600 Localized edema: Secondary | ICD-10-CM

## 2022-07-23 DIAGNOSIS — I4819 Other persistent atrial fibrillation: Secondary | ICD-10-CM | POA: Diagnosis not present

## 2022-07-23 DIAGNOSIS — I10 Essential (primary) hypertension: Secondary | ICD-10-CM | POA: Diagnosis not present

## 2022-07-23 NOTE — Telephone Encounter (Signed)
Per Dr [name], change her from auto CPAP to a set pressure of 8cm H2O and get a download in 4 weeks   order a new chin strap as well

## 2022-07-23 NOTE — Telephone Encounter (Signed)
Order placed to adapt via community message.

## 2022-08-07 ENCOUNTER — Other Ambulatory Visit: Payer: Self-pay | Admitting: Physician Assistant

## 2022-08-20 DIAGNOSIS — G4733 Obstructive sleep apnea (adult) (pediatric): Secondary | ICD-10-CM | POA: Diagnosis not present

## 2022-08-21 ENCOUNTER — Other Ambulatory Visit: Payer: Self-pay

## 2022-08-21 MED ORDER — SPIRONOLACTONE 25 MG PO TABS: 25.0000 mg | ORAL_TABLET | Freq: Every day | ORAL | 3 refills | Status: DC

## 2022-09-02 DIAGNOSIS — L821 Other seborrheic keratosis: Secondary | ICD-10-CM | POA: Diagnosis not present

## 2022-09-02 DIAGNOSIS — L400 Psoriasis vulgaris: Secondary | ICD-10-CM | POA: Diagnosis not present

## 2022-09-02 DIAGNOSIS — L728 Other follicular cysts of the skin and subcutaneous tissue: Secondary | ICD-10-CM | POA: Diagnosis not present

## 2022-09-02 DIAGNOSIS — D225 Melanocytic nevi of trunk: Secondary | ICD-10-CM | POA: Diagnosis not present

## 2022-09-17 DIAGNOSIS — Z01419 Encounter for gynecological examination (general) (routine) without abnormal findings: Secondary | ICD-10-CM | POA: Diagnosis not present

## 2022-09-17 DIAGNOSIS — N952 Postmenopausal atrophic vaginitis: Secondary | ICD-10-CM | POA: Diagnosis not present

## 2022-09-17 DIAGNOSIS — Z6830 Body mass index (BMI) 30.0-30.9, adult: Secondary | ICD-10-CM | POA: Diagnosis not present

## 2022-09-20 NOTE — Progress Notes (Unsigned)
Virtual Visit via Video Note   Because of Erin Alvarez's co-morbid illnesses, she is at least at moderate risk for complications without adequate follow up.  This format is felt to be most appropriate for this patient at this time.  All issues noted in this document were discussed and addressed.  A limited physical exam was performed with this format.  Please refer to the patient's chart for her consent to telehealth for Erin Alvarez.  Date:  09/21/2022   ID:  Erin Alvarez, DOB January 25, 1946, MRN [id number] The patient was identified using 2 identifiers.  Patient Location: Home Provider Location: Home Office   PCP:  [name], [place] Providers Cardiologist:  [name], MD     Evaluation Performed:  Follow-Up Visit  Chief Complaint:  PAF, OSA, HTN  History of Present Illness:    Erin Alvarez is a 77 y.o. female with a hx of atypical CP and subsequent stress echo with no ischemia, rheumatic fever as a child,  grade 2 diastolic dysfunction by echo and persistent atrial fibrillation (first diagnosed in 2013) and has been on Eliquis for a CHADS2Vasc score of 3, Flecainide and Cardizem. She has severe OSA with an initial AHI of 29/hr on her sleep study and is on CPAP and last OV her PAP was decreased to 4-10cm H2O due to dry mouth.   She is here today for followup and is doing well.  She denies any chest pain or pressure, SOB, DOE, PND, orthopnea, LE edema, dizziness, palpitations or syncope. She is compliant with her meds and is tolerating meds with no SE.   At last OV she was having issues with her mouth opening with the nasal cushion mask. She felt the pressure was very strong as it increases and could not keep her mouth closed.   I changed her to CPAP at 31m H2O and ordered a new chin strap and she is now here for followup  [name] is doing well with her PAP device and thinks that she has gotten used to it.  She tolerates the mask and feels the pressure  is adequate.  Since going on PAP seh feels rested in the am and has no significant daytime sleepiness.  She denies any significant mouth or nasal dryness or nasal congestion.  She does not think that he snores.    Past Medical History:  Diagnosis Date   Allergic rhinitis, cause unspecified    Allergy    Cataract    Disorder of bone and cartilage, unspecified    Diverticulosis of colon (without mention of hemorrhage)    Elbow fracture, right 05/2011   Essential hypertension, benign    Family history of malignant neoplasm of breast    H/O: rheumatic fever    Heart murmur    dx as child - s/p rheumatic fever   Leukocytopenia, unspecified    Lung nodule 06/2009   CT of chest, 4 mm subpleural nodule in the superior segment of the right lower lobe   Mild mitral regurgitation    Mild tricuspid regurgitation    OSA (obstructive sleep apnea)    Severe w AHI 29/hr and successful CPAP now on auto CPAP   Osteopenia    Osteoporosis    Persistent atrial fibrillation 05/04/2012   on systemic anticoagulation w Xarelto for CHADS VASC score of 3   Pneumonia, organism unspecified(486)    Pulmonary HTN    Sleep apnea    cpap  Unspecified sinusitis (chronic)    Past Surgical History:  Procedure Laterality Date   BREAST BIOPSY Left    biopsy - benign   DILATION AND CURETTAGE OF UTERUS     FOOT SURGERY Left    TONSILLECTOMY     TUBAL LIGATION       No outpatient medications have been marked as taking for the 09/21/22 encounter (Video Visit) with Erin Margarita, MD.     Allergies:   Ciprofloxacin and Naproxen   Social History   Tobacco Use   Smoking status: Former    Packs/day: 2.00    Years: 15.00    Additional pack years: 0.00    Total pack years: 30.00    Types: Cigarettes    Quit date: 06/22/1974    Years since quitting: 48.2   Smokeless tobacco: Never  Vaping Use   Vaping Use: Never used  Substance Use Topics   Alcohol use: Yes    Alcohol/week: 0.0 standard drinks of alcohol     Comment: rare wine   Drug use: No     Family Hx: The patient's family history includes Brain cancer in her mother; Colon cancer in her maternal aunt; Emphysema in her father; Heart disease (age of onset: 44) in her mother; Hyperlipidemia in her brother and another family member; Hypertension in her brother, father, and another family member; Lung cancer in her mother. There is no history of Stomach cancer or Rectal cancer.  ROS:   Please see the history of present illness.     All other systems reviewed and are negative.   Prior CV studies:   The following studies were reviewed today:  PAP compliance download  Labs/Other Tests and Data Reviewed:    EKG:  No ECG reviewed.  Recent Labs: 11/19/2021: Hemoglobin 14.0; Platelets 208 04/22/2022: TSH 1.930 05/20/2022: Magnesium 2.1 06/11/2022: BUN 15; Creatinine, Ser 0.86; Potassium 4.2; Sodium 143   Recent Lipid Panel Lab Results  Component Value Date/Time   CHOL 169 07/03/2010 08:29 PM   TRIG 52 07/03/2010 08:29 PM   HDL 72 07/03/2010 08:29 PM   CHOLHDL 2.3 Ratio 07/03/2010 08:29 PM   LDLCALC 87 07/03/2010 08:29 PM    Wt Readings from Last 3 Encounters:  09/21/22 174 lb (78.9 kg)  07/23/22 173 lb (78.5 kg)  07/16/22 174 lb (78.9 kg)     Risk Assessment/Calculations:    CHA2DS2-VASc Score = 5   This indicates a 7.2% annual risk of stroke. The patient's score is based upon: CHF History: 1 HTN History: 1 Diabetes History: 0 Stroke History: 0 Vascular Disease History: 0 Age Score: 2 Gender Score: 1         Objective   Vital Signs:  BP 115/69   Pulse 62   Wt 174 lb (78.9 kg)   BMI 28.96 kg/m   Well nourished, well developed female in no acute distress. Well appearing, alert and conversant, regular work of breathing,  good skin color  Eyes- anicteric mouth- oral mucosa is pink  neuro- grossly intact skin- no apparent rash or lesions or cyanosis ASSESSMENT & PLAN:    1.  Persistent atrial  fibrillation -she has not had an palpitations recently -She denies any bleeding issues on DOAC -continue prescription drug management with flecainide 100mg  BID, Eliquis 5mg  BID and Cardizem CD 120mg  daily with PRN refills -I have personally reviewed and interpreted outside labs performed by patient's PCP which showed SCr 0.86 and K+ 4.2 on 06/11/22 -check Hbg -EKG done 04/2022 showed chronic  RBBB with 1st degree AVB with slight increase in QRS duration from 149ms to 188ms (was 153ms in May 2022) -will repeat EKG in May to make sure intervals remain stable   2.  HTN -BP is well controlled this am -continue prescription drug management with Cardizem CD 120mg  daily, Spironolactone 25mg  daily and losartan 75mg  daily with PRN refills   3.  Pulmonary HTN -this was resolved on last 2D echo   4.  LE edema -she occasionally has some ankle edema but not enough to take diuretics -Continue prescription drug management with Lasix 20mg  PRN for edema -change Kdur to 3meq to take only when she takes a lasix   5.  OSA - The patient is tolerating PAP therapy well without any problems. The PAP download performed by his DME was personally reviewed and interpreted by me today and showed an AHI of 2.9/hr on 8 cm H2O with 100% compliance in using more than 4 hours nightly.  The patient has been using and benefiting from PAP use and will continue to benefit from therapy.   Time:   Today, I have spent 15 minutes with the patient with telehealth technology discussing the above problems.     Medication Adjustments/Labs and Tests Ordered: Current medicines are reviewed at length with the patient today.  Concerns regarding medicines are outlined above.   Tests Ordered: No orders of the defined types were placed in this encounter.   Medication Changes: No orders of the defined types were placed in this encounter.   Follow Up: 2 months virtual for followup of CPAP changes  Signed, [name], MD   09/21/2022 9:01 AM    [place]

## 2022-09-21 ENCOUNTER — Encounter: Payer: Self-pay | Admitting: Cardiology

## 2022-09-21 ENCOUNTER — Ambulatory Visit: Payer: Medicare HMO | Attending: Cardiology | Admitting: Cardiology

## 2022-09-21 VITALS — BP 115/69 | HR 62 | Wt 174.0 lb

## 2022-09-21 DIAGNOSIS — I272 Pulmonary hypertension, unspecified: Secondary | ICD-10-CM

## 2022-09-21 DIAGNOSIS — G4733 Obstructive sleep apnea (adult) (pediatric): Secondary | ICD-10-CM | POA: Diagnosis not present

## 2022-09-21 DIAGNOSIS — I10 Essential (primary) hypertension: Secondary | ICD-10-CM

## 2022-09-21 DIAGNOSIS — R600 Localized edema: Secondary | ICD-10-CM | POA: Diagnosis not present

## 2022-09-21 DIAGNOSIS — I4819 Other persistent atrial fibrillation: Secondary | ICD-10-CM | POA: Diagnosis not present

## 2022-09-21 MED ORDER — POTASSIUM CHLORIDE CRYS ER 20 MEQ PO TBCR: 20.0000 meq | EXTENDED_RELEASE_TABLET | ORAL | 3 refills | Status: DC | PRN

## 2022-09-21 NOTE — Patient Instructions (Signed)
Medication Instructions:  Your physician has recommended you make the following change in your medication:   Potassium 20 mEq 1 tab daily when taking lasix only.  *If you need a refill on your cardiac medications before your next appointment, please call your pharmacy*   Lab Work: CBC 10/26/2022  If you have labs (blood work) drawn today and your tests are completely normal, you will receive your results only by: [place] (if you have MyChart) OR A paper copy in the mail If you have any lab test that is abnormal or we need to change your treatment, we will call you to review the results.   Testing/Procedures: EKG 10/26/2022   Follow-Up: At [facility], you and your health needs are our priority.  As part of our continuing mission to provide you with exceptional heart care, we have created designated Provider Care Teams.  These Care Teams include your primary Cardiologist (physician) and Advanced Practice Providers (APPs -  Physician Assistants and Nurse Practitioners) who all work together to provide you with the care you need, when you need it.   Your next appointment:   1 month(s)  Provider:   Nurse visit   Then 1 year with Dr [name]

## 2022-09-21 NOTE — Addendum Note (Signed)
Addended by: Vergia Alcon A on: 09/21/2022 10:12 AM   Modules accepted: Orders

## 2022-10-02 DIAGNOSIS — H16202 Unspecified keratoconjunctivitis, left eye: Secondary | ICD-10-CM | POA: Diagnosis not present

## 2022-10-12 ENCOUNTER — Other Ambulatory Visit: Payer: Self-pay | Admitting: Internal Medicine

## 2022-10-16 ENCOUNTER — Other Ambulatory Visit (HOSPITAL_COMMUNITY): Payer: Self-pay

## 2022-10-19 ENCOUNTER — Ambulatory Visit (HOSPITAL_COMMUNITY)
Admission: RE | Admit: 2022-10-19 | Discharge: 2022-10-19 | Disposition: A | Discharge status: Home / Self Care | Payer: Medicare HMO | Source: Ambulatory Visit | Attending: Internal Medicine | Admitting: Internal Medicine

## 2022-10-19 DIAGNOSIS — M810 Age-related osteoporosis without current pathological fracture: Secondary | ICD-10-CM | POA: Diagnosis not present

## 2022-10-19 MED ORDER — DENOSUMAB 60 MG/ML ~~LOC~~ SOSY
PREFILLED_SYRINGE | SUBCUTANEOUS | Status: AC
  Filled 2022-10-19: qty 1

## 2022-10-19 MED ORDER — DENOSUMAB 60 MG/ML ~~LOC~~ SOSY
60.0000 mg | PREFILLED_SYRINGE | Freq: Once | SUBCUTANEOUS | Status: AC
  Administered 2022-10-19: 60 mg via SUBCUTANEOUS

## 2022-10-26 ENCOUNTER — Ambulatory Visit: Payer: Medicare HMO | Attending: Internal Medicine

## 2022-10-26 DIAGNOSIS — I272 Pulmonary hypertension, unspecified: Secondary | ICD-10-CM

## 2022-10-26 DIAGNOSIS — R600 Localized edema: Secondary | ICD-10-CM | POA: Diagnosis not present

## 2022-10-26 DIAGNOSIS — I4819 Other persistent atrial fibrillation: Secondary | ICD-10-CM

## 2022-10-26 DIAGNOSIS — G4733 Obstructive sleep apnea (adult) (pediatric): Secondary | ICD-10-CM | POA: Diagnosis not present

## 2022-10-26 DIAGNOSIS — I10 Essential (primary) hypertension: Secondary | ICD-10-CM | POA: Diagnosis not present

## 2022-10-26 NOTE — Progress Notes (Signed)
   Nurse Visit   Date of Encounter: 10/26/2022 ID: Erin Alvarez, DOB March 04, 1946, MRN [id number]  PCP:  Alysia Penna, MD   [facility] Providers Cardiologist:  Armanda Magic, MD      Visit Details   VS:  BP 132/78 (BP Location: Left Arm, Patient Position: Sitting, Cuff Size: Normal)   Pulse (!) 58   Resp 18   Ht 5' 4.5" (1.638 m)   Wt 179 lb (81.2 kg)   SpO2 98%   BMI 30.25 kg/m  , BMI Body mass index is 30.25 kg/m.  Wt Readings from Last 3 Encounters:  10/26/22 179 lb (81.2 kg)  09/21/22 174 lb (78.9 kg)  07/23/22 173 lb (78.5 kg)     Reason for visit: EKG Performed today: Vitals, EKG, Provider consulted:Dr Anne Fu, and Education Changes (medications, testing, etc.) : None  Length of Visit: 15 minutes    Medications Adjustments/Labs and Tests Ordered: No orders of the defined types were placed in this encounter.  No orders of the defined types were placed in this encounter.    Signed, Festus Holts, RN  10/26/2022 11:56 AM

## 2022-10-27 LAB — CBC
Hematocrit: 42.3 % (ref 34.0–46.6)
Hemoglobin: 13.6 g/dL (ref 11.1–15.9)
MCH: 29.1 pg (ref 26.6–33.0)
MCHC: 32.2 g/dL (ref 31.5–35.7)
MCV: 91 fL (ref 79–97)
Platelets: 230 10*3/uL (ref 150–450)
RBC: 4.67 x10E6/uL (ref 3.77–5.28)
RDW: 12.1 % (ref 11.7–15.4)
WBC: 5.4 10*3/uL (ref 3.4–10.8)

## 2022-10-27 NOTE — Progress Notes (Signed)
Patient notified via My Chart

## 2022-10-29 ENCOUNTER — Other Ambulatory Visit: Payer: Self-pay | Admitting: Cardiology

## 2022-10-29 DIAGNOSIS — I4819 Other persistent atrial fibrillation: Secondary | ICD-10-CM

## 2022-10-29 NOTE — Telephone Encounter (Signed)
Eliquis 5mg  refill request received. Patient is 77 years old, weight-81.2kg, Crea-0.86 on 06/11/22, Diagnosis-Afib, and last seen by Dr. Mayford Knife on 09/21/22. Dose is appropriate based on dosing criteria. Will send in refill to requested pharmacy.

## 2022-11-03 DIAGNOSIS — M9903 Segmental and somatic dysfunction of lumbar region: Secondary | ICD-10-CM | POA: Diagnosis not present

## 2022-11-03 DIAGNOSIS — M25559 Pain in unspecified hip: Secondary | ICD-10-CM | POA: Diagnosis not present

## 2022-11-06 ENCOUNTER — Other Ambulatory Visit: Payer: Self-pay | Admitting: Cardiology

## 2022-11-18 DIAGNOSIS — G4733 Obstructive sleep apnea (adult) (pediatric): Secondary | ICD-10-CM | POA: Diagnosis not present

## 2022-11-20 DIAGNOSIS — Z1231 Encounter for screening mammogram for malignant neoplasm of breast: Secondary | ICD-10-CM | POA: Diagnosis not present

## 2022-11-26 ENCOUNTER — Encounter: Payer: Self-pay | Admitting: Cardiology

## 2022-11-26 ENCOUNTER — Ambulatory Visit: Payer: Medicare HMO | Attending: Cardiology | Admitting: Cardiology

## 2022-11-26 VITALS — BP 102/60 | HR 52 | Ht 64.5 in | Wt 179.0 lb

## 2022-11-26 DIAGNOSIS — R600 Localized edema: Secondary | ICD-10-CM

## 2022-11-26 DIAGNOSIS — I10 Essential (primary) hypertension: Secondary | ICD-10-CM | POA: Diagnosis not present

## 2022-11-26 DIAGNOSIS — I4819 Other persistent atrial fibrillation: Secondary | ICD-10-CM

## 2022-11-26 DIAGNOSIS — I272 Pulmonary hypertension, unspecified: Secondary | ICD-10-CM

## 2022-11-26 DIAGNOSIS — G4733 Obstructive sleep apnea (adult) (pediatric): Secondary | ICD-10-CM

## 2022-11-26 NOTE — Patient Instructions (Signed)
Medication Instructions:  Your physician recommends that you continue on your current medications as directed. Please refer to the Current Medication list given to you today.  *If you need a refill on your cardiac medications before your next appointment, please call your pharmacy*   Lab Work: None.  If you have labs (blood work) drawn today and your tests are completely normal, you will receive your results only by: MyChart Message (if you have MyChart) OR A paper copy in the mail If you have any lab test that is abnormal or we need to change your treatment, we will call you to review the results.   Testing/Procedures: None.   Follow-Up: At [facility], you and your health needs are our priority.  As part of our continuing mission to provide you with exceptional heart care, we have created designated Provider Care Teams.  These Care Teams include your primary Cardiologist (physician) and Advanced Practice Providers (APPs -  Physician Assistants and Nurse Practitioners) who all work together to provide you with the care you need, when you need it.  We recommend signing up for the patient portal called "MyChart".  Sign up information is provided on this After Visit Summary.  MyChart is used to connect with patients for Virtual Visits (Telemedicine).  Patients are able to view lab/test results, encounter notes, upcoming appointments, etc.  Non-urgent messages can be sent to your provider as well.   To learn more about what you can do with MyChart, go to [email or web address].    Your next appointment:   1 year(s)  Provider:   Traci Turner, MD     

## 2022-11-26 NOTE — Progress Notes (Signed)
Date:  11/26/2022   ID:  Erin Alvarez, DOB 09/16/1945, MRN [id number] The patient was identified using 2 identifiers.  PCP:  Alysia Penna, MD   [facility] Providers Cardiologist:  Armanda Magic, MD     Evaluation Performed:  Follow-Up Visit  Chief Complaint:  PAF, OSA, HTN  History of Present Illness:    Erin Alvarez is a 77 y.o. female with a hx of atypical CP and subsequent stress echo with no ischemia, rheumatic fever as a child,  grade 2 diastolic dysfunction by echo and persistent atrial fibrillation (first diagnosed in 2013) and has been on Eliquis for a CHADS2Vasc score of 3, Flecainide and Cardizem. She has severe OSA with an initial AHI of 29/hr on her sleep study and is on CPAP and last OV her PAP was decreased to 4-10cm H2O due to dry mouth.   She is here today for followup and is doing well.  She denies any chest pain or pressure, SOB, DOE, PND, orthopnea, dizziness, palpitations or syncope. She has mild chronic LE edema that is stable on PRN Lasix.  She is compliant with her meds and is tolerating meds with no SE.    She is doing well with her PAP device and thinks that she has gotten used to it.  She tolerates the mask and feels the pressure is adequate. Since going on PAP she feels rested in the am and has no significant daytime sleepiness.  She denies any significant mouth or nasal dryness or nasal congestion.  She does not think that she snores.    Past Medical History:  Diagnosis Date   Allergic rhinitis, cause unspecified    Allergy    Cataract    Disorder of bone and cartilage, unspecified    Diverticulosis of colon (without mention of hemorrhage)    Elbow fracture, right 05/2011   Essential hypertension, benign    Family history of malignant neoplasm of breast    H/O: rheumatic fever    Heart murmur    dx as child - s/p rheumatic fever   Leukocytopenia, unspecified    Lung nodule 06/2009   CT of chest, 4 mm subpleural nodule in the superior  segment of the right lower lobe   Mild mitral regurgitation    Mild tricuspid regurgitation    OSA (obstructive sleep apnea)    Severe w AHI 29/hr and successful CPAP now on auto CPAP   Osteopenia    Osteoporosis    Persistent atrial fibrillation (HCC) 05/04/2012   on systemic anticoagulation w Xarelto for CHADS VASC score of 3   Pneumonia, organism unspecified(486)    Pulmonary HTN (HCC)    Sleep apnea    cpap   Unspecified sinusitis (chronic)    Past Surgical History:  Procedure Laterality Date   BREAST BIOPSY Left    biopsy - benign   DILATION AND CURETTAGE OF UTERUS     FOOT SURGERY Left    TONSILLECTOMY     TUBAL LIGATION       Current Meds  Medication Sig   Cholecalciferol (VITAMIN D3) 50 MCG (2000 UT) TABS Take 50 mcg by mouth daily.   denosumab (PROLIA) 60 MG/ML SOSY injection Inject 60 mg into the skin every 6 (six) months.   diltiazem (CARDIZEM CD) 120 MG 24 hr capsule Take 1 capsule (120 mg total) by mouth daily.   ELIQUIS 5 MG TABS tablet TAKE 1 TABLET TWICE A DAY   estradiol (ESTRACE) 0.1 MG/GM vaginal cream  Place 1 Applicatorful vaginally once a week.   flecainide (TAMBOCOR) 100 MG tablet TAKE 1 TABLET TWICE A DAY   furosemide (LASIX) 20 MG tablet Take 20 mg by mouth daily as needed for edema.   Guselkumab (TREMFYA) 100 MG/ML SOSY Inject into the skin every 6 (six) weeks.   losartan (COZAAR) 50 MG tablet Take 1.5 tablets (75 mg total) by mouth daily.   Magnesium 400 MG CAPS Take 400 mg by mouth daily.   mometasone (NASONEX) 50 MCG/ACT nasal spray Place 2 sprays into the nose daily as needed (allergies).   Multiple Vitamins-Minerals (CENTRUM PO) Take 1 tablet by mouth daily.     NON FORMULARY CPAP at bedtime   Omega-3 Fatty Acids (FISH OIL) 1200 MG CAPS Take 2 capsules by mouth daily.     potassium chloride SA (KLOR-CON M20) 20 MEQ tablet Take 1 tablet (20 mEq total) by mouth as needed. Take when taking lasix only.     Allergies:   Ciprofloxacin and Naproxen    Social History   Tobacco Use   Smoking status: Former    Packs/day: 2.00    Years: 15.00    Additional pack years: 0.00    Total pack years: 30.00    Types: Cigarettes    Quit date: 06/22/1974    Years since quitting: 48.4   Smokeless tobacco: Never  Vaping Use   Vaping Use: Never used  Substance Use Topics   Alcohol use: Yes    Alcohol/week: 0.0 standard drinks of alcohol    Comment: rare wine   Drug use: No     Family Hx: The patient's family history includes Brain cancer in her mother; Colon cancer in her maternal aunt; Emphysema in her father; Heart disease (age of onset: 76) in her mother; Hyperlipidemia in her brother and another family member; Hypertension in her brother, father, and another family member; Lung cancer in her mother. There is no history of Stomach cancer or Rectal cancer.  ROS:   Please see the history of present illness.     All other systems reviewed and are negative.   Prior CV studies:   The following studies were reviewed today:  PAP compliance download  Labs/Other Tests and Data Reviewed:    EKG:  No ECG reviewed.  Recent Labs: 04/22/2022: TSH 1.930 05/20/2022: Magnesium 2.1 06/11/2022: BUN 15; Creatinine, Ser 0.86; Potassium 4.2; Sodium 143 10/26/2022: Hemoglobin 13.6; Platelets 230   Recent Lipid Panel Lab Results  Component Value Date/Time   CHOL 169 07/03/2010 08:29 PM   TRIG 52 07/03/2010 08:29 PM   HDL 72 07/03/2010 08:29 PM   CHOLHDL 2.3 Ratio 07/03/2010 08:29 PM   LDLCALC 87 07/03/2010 08:29 PM    Wt Readings from Last 3 Encounters:  11/26/22 179 lb (81.2 kg)  10/26/22 179 lb (81.2 kg)  09/21/22 174 lb (78.9 kg)     Risk Assessment/Calculations:    CHA2DS2-VASc Score =     This indicates a  % annual risk of stroke. The patient's score is based upon:           Objective   Vital Signs:  BP 102/60   Pulse (!) 52   Ht 5' 4.5" (1.638 m)   Wt 179 lb (81.2 kg)   SpO2 97%   BMI 30.25 kg/m   GEN: Well  nourished, well developed in no acute distress HEENT: Normal NECK: No JVD; No carotid bruits LYMPHATICS: No lymphadenopathy CARDIAC:RRR, no murmurs, rubs, gallops RESPIRATORY:  Clear to auscultation without  rales, wheezing or rhonchi  ABDOMEN: Soft, non-tender, non-distended MUSCULOSKELETAL:  No edema; No deformity  SKIN: Warm and dry NEUROLOGIC:  Alert and oriented x 3 PSYCHIATRIC:  Normal affect   ASSESSMENT & PLAN:    1.  Persistent atrial fibrillation -She remains in sinus rhythm and denies palpitations -No bleeding problems on DOAC -continue drug management Cardizem CD 120 mg daily, Eliquis 5 mg twice daily and Tambocor 100 mg twice daily with as needed refills -I have personally reviewed and interpreted outside labs performed by patient's PCP which showed serum creatinine 0.86 on 06/11/2022 and Hbg 13.6 on 10/2022 -EKG done 04/2022 showed chronic RBBB with 1st degree AVB with slight increase in QRS duration from to (was in May 2022).  EKG today shows chronic right bundle branch block with QRS duration of   2.  HTN -BP is controlled on exam today -Continue prescription management with Cardizem CD 120 mg daily, losartan 75 mg daily and spironolactone 25 mg daily with as needed refills   3.  Pulmonary HTN -this was resolved on last 2D echo   4.  LE edema -Her lower extremity edema is well-controlled on diuretics -Continue prescription drug agement with Lasix 20 mg daily as needed for swelling and spironolactone 25 mg daily with as needed refills -I have personally reviewed and interpreted outside labs performed by patient's PCP which showed serum creatinine 0.86 and potassium 4.2 on 06/11/2022   5.  OSA - The patient is tolerating PAP therapy well without any problems. The PAP download performed by his DME was personally reviewed and interpreted by me today and showed an AHI of 2.6/hr on 8 cm H2O with 100% compliance in using more than 4 hours nightly.  The  patient has been using and benefiting from PAP use and will continue to benefit from therapy.    Medication Adjustments/Labs and Tests Ordered: Current medicines are reviewed at length with the patient today.  Concerns regarding medicines are outlined above.   Tests Ordered: No orders of the defined types were placed in this encounter.   Medication Changes: No orders of the defined types were placed in this encounter.   Follow Up: 2 months virtual for followup of CPAP changes  Signed, Armanda Magic, MD  11/26/2022 2:18 PM    [facility]

## 2022-12-04 DIAGNOSIS — M545 Low back pain, unspecified: Secondary | ICD-10-CM | POA: Diagnosis not present

## 2022-12-04 DIAGNOSIS — M1611 Unilateral primary osteoarthritis, right hip: Secondary | ICD-10-CM | POA: Diagnosis not present

## 2022-12-17 ENCOUNTER — Other Ambulatory Visit: Payer: Self-pay | Admitting: Cardiology

## 2022-12-23 DIAGNOSIS — N760 Acute vaginitis: Secondary | ICD-10-CM | POA: Diagnosis not present

## 2022-12-23 DIAGNOSIS — R319 Hematuria, unspecified: Secondary | ICD-10-CM | POA: Diagnosis not present

## 2022-12-23 DIAGNOSIS — N950 Postmenopausal bleeding: Secondary | ICD-10-CM | POA: Diagnosis not present

## 2022-12-28 DIAGNOSIS — M1611 Unilateral primary osteoarthritis, right hip: Secondary | ICD-10-CM | POA: Diagnosis not present

## 2022-12-28 DIAGNOSIS — M545 Low back pain, unspecified: Secondary | ICD-10-CM | POA: Diagnosis not present

## 2022-12-31 ENCOUNTER — Telehealth: Payer: Self-pay | Admitting: *Deleted

## 2022-12-31 DIAGNOSIS — N950 Postmenopausal bleeding: Secondary | ICD-10-CM | POA: Diagnosis not present

## 2022-12-31 DIAGNOSIS — R9389 Abnormal findings on diagnostic imaging of other specified body structures: Secondary | ICD-10-CM | POA: Diagnosis not present

## 2022-12-31 NOTE — Telephone Encounter (Signed)
   Pre-operative Risk Assessment    Patient Name: Erin Alvarez  DOB: Feb 21, 1946 MRN: [id number]      Request for Surgical Clearance    Procedure:   HYST, D&C, POSSIBLE MYOSURE  Date of Surgery:  Clearance 02/08/23                                 Surgeon:  DR. Harold Hedge Surgeon's Group or Practice Name:  [facility] Phone number:  [phone] Fax number:  [phone]   Type of Clearance Requested:   - Medical  - Pharmacy:  Hold Apixaban (Eliquis)     Type of Anesthesia:  Not Indicated (GENERAL?)   Additional requests/questions:    [name]   12/31/2022, 5:59 PM

## 2023-01-01 NOTE — Telephone Encounter (Signed)
Patient with diagnosis of atrial fibrillation on Eliquis for anticoagulation.    Procedure:   HYST, D&C, POSSIBLE MYOSURE   Date of Surgery:  Clearance 02/08/23   CHA2DS2-VASc Score = 4   This indicates a 4.8% annual risk of stroke. The patient's score is based upon: CHF History: 0 HTN History: 1 Diabetes History: 0 Stroke History: 0 Vascular Disease History: 0 Age Score: 2 Gender Score: 1    CrCl 68 Platelet count 230  Per office protocol, patient can hold Eliquis for 2 days prior to procedure.   Patient will not need bridging with Lovenox (enoxaparin) around procedure.  **This guidance is not considered finalized until pre-operative APP has relayed final recommendations.**

## 2023-01-05 DIAGNOSIS — B370 Candidal stomatitis: Secondary | ICD-10-CM | POA: Diagnosis not present

## 2023-01-05 NOTE — Telephone Encounter (Signed)
     Primary Cardiologist: Armanda Magic, MD  Chart reviewed as part of pre-operative protocol coverage. Given past medical history and time since last visit, based on ACC/AHA guidelines, Erin Alvarez would be at acceptable risk for the planned procedure without further cardiovascular testing.   Patient with diagnosis of atrial fibrillation on Eliquis for anticoagulation.     Procedure:   HYST, D&C, POSSIBLE MYOSURE   Date of Surgery:  Clearance 02/08/23     CHA2DS2-VASc Score = 4   This indicates a 4.8% annual risk of stroke. The patient's score is based upon: CHF History: 0 HTN History: 1 Diabetes History: 0 Stroke History: 0 Vascular Disease History: 0 Age Score: 2 Gender Score: 1     CrCl 68 Platelet count 230   Per office protocol, patient can hold Eliquis for 2 days prior to procedure.   Patient will not need bridging with Lovenox (enoxaparin) around procedure.   I will route this recommendation to the requesting party via Epic fax function and remove from pre-op pool.  Please call with questions.  Thomasene Ripple. Kriti Katayama NP-C     01/05/2023, 9:02 AM [facility] [address] Office [phone] Fax [phone]

## 2023-01-21 DIAGNOSIS — U071 COVID-19: Secondary | ICD-10-CM | POA: Diagnosis not present

## 2023-01-21 DIAGNOSIS — R051 Acute cough: Secondary | ICD-10-CM | POA: Diagnosis not present

## 2023-01-28 DIAGNOSIS — R9389 Abnormal findings on diagnostic imaging of other specified body structures: Secondary | ICD-10-CM | POA: Diagnosis not present

## 2023-01-28 DIAGNOSIS — N950 Postmenopausal bleeding: Secondary | ICD-10-CM | POA: Diagnosis not present

## 2023-02-18 DIAGNOSIS — G4733 Obstructive sleep apnea (adult) (pediatric): Secondary | ICD-10-CM | POA: Diagnosis not present

## 2023-02-19 DIAGNOSIS — M1990 Unspecified osteoarthritis, unspecified site: Secondary | ICD-10-CM | POA: Diagnosis not present

## 2023-02-19 DIAGNOSIS — M810 Age-related osteoporosis without current pathological fracture: Secondary | ICD-10-CM | POA: Diagnosis not present

## 2023-02-19 DIAGNOSIS — Z008 Encounter for other general examination: Secondary | ICD-10-CM | POA: Diagnosis not present

## 2023-02-19 DIAGNOSIS — J302 Other seasonal allergic rhinitis: Secondary | ICD-10-CM | POA: Diagnosis not present

## 2023-02-19 DIAGNOSIS — G4730 Sleep apnea, unspecified: Secondary | ICD-10-CM | POA: Diagnosis not present

## 2023-02-19 DIAGNOSIS — I4891 Unspecified atrial fibrillation: Secondary | ICD-10-CM | POA: Diagnosis not present

## 2023-02-19 DIAGNOSIS — I509 Heart failure, unspecified: Secondary | ICD-10-CM | POA: Diagnosis not present

## 2023-02-19 DIAGNOSIS — K5900 Constipation, unspecified: Secondary | ICD-10-CM | POA: Diagnosis not present

## 2023-02-19 DIAGNOSIS — E876 Hypokalemia: Secondary | ICD-10-CM | POA: Diagnosis not present

## 2023-02-19 DIAGNOSIS — I110 Hypertensive heart disease with heart failure: Secondary | ICD-10-CM | POA: Diagnosis not present

## 2023-02-19 DIAGNOSIS — M545 Low back pain, unspecified: Secondary | ICD-10-CM | POA: Diagnosis not present

## 2023-02-24 ENCOUNTER — Telehealth: Payer: Self-pay | Admitting: Cardiology

## 2023-02-24 DIAGNOSIS — Z79899 Other long term (current) drug therapy: Secondary | ICD-10-CM

## 2023-02-24 DIAGNOSIS — I10 Essential (primary) hypertension: Secondary | ICD-10-CM

## 2023-02-24 DIAGNOSIS — I4819 Other persistent atrial fibrillation: Secondary | ICD-10-CM

## 2023-02-24 NOTE — Telephone Encounter (Signed)
Pt c/o medication issue:  1. Name of Medication: ELIQUIS 5 MG TABS tablet   2. How are you currently taking this medication (dosage and times per day)? As prescribed   3. Are you having a reaction (difficulty breathing--STAT)? Yes  4. What is your medication issue? Patient states she has been bruising extremely easy for the past week. She reports she was rubbing cream on her shin and a blood blister has now came up. She is wanting to know if her blood is too thin due to the Eliquis. Please advise.

## 2023-02-24 NOTE — Telephone Encounter (Signed)
Patient states she has been having worse bruising to her legs over the past week. She was rubbing lotion on her legs and developed a blood blister, she reports "I could see the bruising and the blood moving down my shin."   Patient denies any other bleeding from gums, nose, or in urine. She does report 1 episode of bleeding with bowel movement the other day, but attributes this to passing hard stool that one time. No bleeding since.  Patient is concerned Eliquis is causing her blood to be too thin.  Will forward to Dr. Mayford Knife to review and advise.

## 2023-02-26 NOTE — Telephone Encounter (Signed)
Shared Dr. Norris Cross reply with patient.   Per Dr. Mayford Knife:   She is on the correct dose of Eliquis for her age and weight.  Please have her come in for a CBC with platelets    CBC ordered, lab appt scheduled for 03/01/23. Patient verbalized understanding and expressed appreciation for call.

## 2023-03-01 ENCOUNTER — Ambulatory Visit: Payer: Medicare HMO | Attending: Internal Medicine

## 2023-03-01 DIAGNOSIS — Z79899 Other long term (current) drug therapy: Secondary | ICD-10-CM

## 2023-03-01 DIAGNOSIS — I10 Essential (primary) hypertension: Secondary | ICD-10-CM

## 2023-03-01 DIAGNOSIS — I4819 Other persistent atrial fibrillation: Secondary | ICD-10-CM

## 2023-03-02 LAB — CBC
Hematocrit: 42.8 % (ref 34.0–46.6)
Hemoglobin: 14.1 g/dL (ref 11.1–15.9)
MCH: 30.7 pg (ref 26.6–33.0)
MCHC: 32.9 g/dL (ref 31.5–35.7)
MCV: 93 fL (ref 79–97)
Platelets: 248 10*3/uL (ref 150–450)
RBC: 4.59 x10E6/uL (ref 3.77–5.28)
RDW: 13.1 % (ref 11.7–15.4)
WBC: 6.4 10*3/uL (ref 3.4–10.8)

## 2023-03-31 ENCOUNTER — Other Ambulatory Visit: Payer: Self-pay | Admitting: Obstetrics and Gynecology

## 2023-03-31 DIAGNOSIS — D259 Leiomyoma of uterus, unspecified: Secondary | ICD-10-CM | POA: Diagnosis not present

## 2023-03-31 DIAGNOSIS — N950 Postmenopausal bleeding: Secondary | ICD-10-CM | POA: Diagnosis not present

## 2023-03-31 DIAGNOSIS — N840 Polyp of corpus uteri: Secondary | ICD-10-CM | POA: Diagnosis not present

## 2023-04-02 LAB — SURGICAL PATHOLOGY

## 2023-04-14 DIAGNOSIS — H25013 Cortical age-related cataract, bilateral: Secondary | ICD-10-CM | POA: Diagnosis not present

## 2023-04-14 DIAGNOSIS — H2513 Age-related nuclear cataract, bilateral: Secondary | ICD-10-CM | POA: Diagnosis not present

## 2023-04-14 DIAGNOSIS — H04123 Dry eye syndrome of bilateral lacrimal glands: Secondary | ICD-10-CM | POA: Diagnosis not present

## 2023-04-14 DIAGNOSIS — H5203 Hypermetropia, bilateral: Secondary | ICD-10-CM | POA: Diagnosis not present

## 2023-04-14 DIAGNOSIS — H524 Presbyopia: Secondary | ICD-10-CM | POA: Diagnosis not present

## 2023-04-26 DIAGNOSIS — Z0189 Encounter for other specified special examinations: Secondary | ICD-10-CM | POA: Diagnosis not present

## 2023-04-26 DIAGNOSIS — M810 Age-related osteoporosis without current pathological fracture: Secondary | ICD-10-CM | POA: Diagnosis not present

## 2023-04-26 DIAGNOSIS — M8580 Other specified disorders of bone density and structure, unspecified site: Secondary | ICD-10-CM | POA: Diagnosis not present

## 2023-04-26 DIAGNOSIS — I10 Essential (primary) hypertension: Secondary | ICD-10-CM | POA: Diagnosis not present

## 2023-04-26 DIAGNOSIS — Z0000 Encounter for general adult medical examination without abnormal findings: Secondary | ICD-10-CM | POA: Diagnosis not present

## 2023-05-03 DIAGNOSIS — I10 Essential (primary) hypertension: Secondary | ICD-10-CM | POA: Diagnosis not present

## 2023-05-03 DIAGNOSIS — D6869 Other thrombophilia: Secondary | ICD-10-CM | POA: Diagnosis not present

## 2023-05-03 DIAGNOSIS — M810 Age-related osteoporosis without current pathological fracture: Secondary | ICD-10-CM | POA: Diagnosis not present

## 2023-05-03 DIAGNOSIS — D692 Other nonthrombocytopenic purpura: Secondary | ICD-10-CM | POA: Diagnosis not present

## 2023-05-03 DIAGNOSIS — L409 Psoriasis, unspecified: Secondary | ICD-10-CM | POA: Diagnosis not present

## 2023-05-03 DIAGNOSIS — D84821 Immunodeficiency due to drugs: Secondary | ICD-10-CM | POA: Diagnosis not present

## 2023-05-03 DIAGNOSIS — I272 Pulmonary hypertension, unspecified: Secondary | ICD-10-CM | POA: Diagnosis not present

## 2023-05-03 DIAGNOSIS — I4891 Unspecified atrial fibrillation: Secondary | ICD-10-CM | POA: Diagnosis not present

## 2023-05-03 DIAGNOSIS — G4730 Sleep apnea, unspecified: Secondary | ICD-10-CM | POA: Diagnosis not present

## 2023-05-03 DIAGNOSIS — Z0000 Encounter for general adult medical examination without abnormal findings: Secondary | ICD-10-CM | POA: Diagnosis not present

## 2023-05-03 DIAGNOSIS — Z1331 Encounter for screening for depression: Secondary | ICD-10-CM | POA: Diagnosis not present

## 2023-05-03 DIAGNOSIS — Z23 Encounter for immunization: Secondary | ICD-10-CM | POA: Diagnosis not present

## 2023-05-03 DIAGNOSIS — Z1339 Encounter for screening examination for other mental health and behavioral disorders: Secondary | ICD-10-CM | POA: Diagnosis not present

## 2023-05-04 LAB — LAB REPORT - SCANNED
Albumin, Urine POC: <3
Creatinine, POC: 56.7 mg/dL

## 2023-05-21 DIAGNOSIS — G4733 Obstructive sleep apnea (adult) (pediatric): Secondary | ICD-10-CM | POA: Diagnosis not present

## 2023-06-04 DIAGNOSIS — E2839 Other primary ovarian failure: Secondary | ICD-10-CM | POA: Diagnosis not present

## 2023-06-04 DIAGNOSIS — M8588 Other specified disorders of bone density and structure, other site: Secondary | ICD-10-CM | POA: Diagnosis not present

## 2023-08-18 DIAGNOSIS — L400 Psoriasis vulgaris: Secondary | ICD-10-CM | POA: Diagnosis not present

## 2023-08-18 DIAGNOSIS — L570 Actinic keratosis: Secondary | ICD-10-CM | POA: Diagnosis not present

## 2023-08-18 DIAGNOSIS — L299 Pruritus, unspecified: Secondary | ICD-10-CM | POA: Diagnosis not present

## 2023-08-20 DIAGNOSIS — G4733 Obstructive sleep apnea (adult) (pediatric): Secondary | ICD-10-CM | POA: Diagnosis not present

## 2023-08-25 DIAGNOSIS — H00011 Hordeolum externum right upper eyelid: Secondary | ICD-10-CM | POA: Diagnosis not present

## 2023-08-31 ENCOUNTER — Other Ambulatory Visit: Payer: Self-pay | Admitting: Cardiology

## 2023-09-16 ENCOUNTER — Other Ambulatory Visit: Payer: Self-pay | Admitting: Cardiology

## 2023-09-16 ENCOUNTER — Telehealth: Payer: Self-pay

## 2023-09-16 MED ORDER — SPIRONOLACTONE 25 MG PO TABS: 25.0000 mg | ORAL_TABLET | Freq: Every day | ORAL | 3 refills | Status: AC

## 2023-09-16 NOTE — Telephone Encounter (Signed)
 Called and spoke with patient. She states that she had labs done at PCP in October and will ask that they fax it over. She is coming in next month here for appt with TT, so if not received, can draw then.

## 2023-09-16 NOTE — Telephone Encounter (Signed)
 Called patient back about message. Patient stated that pharmacy stated she was to hold spirolactone. There is no note stating this in her chart. Will check with Dr. Mayford Knife to see if refill is okay and get lab work on patient since she does not have any recent labs.

## 2023-09-16 NOTE — Telephone Encounter (Signed)
 Pt c/o medication issue:  1. Name of Medication: spironolactone (ALDACTONE) 25 MG tablet   2. How are you currently taking this medication (dosage and times per day)? As written   3. Are you having a reaction (difficulty breathing--STAT)? No   4. What is your medication issue?  Pt called in stating she received a letter from [facility] that this med had been placed on hold, please advise.   Phone number: [phone]

## 2023-09-16 NOTE — Telephone Encounter (Signed)
-----   Message from Armanda Magic sent at 09/16/2023  2:58 PM EDT ----- Since I am refilling her spironolactone she has to come in for a be met because she has not had any labs done since 06/10/2022

## 2023-09-27 ENCOUNTER — Ambulatory Visit: Attending: Cardiology | Admitting: Cardiology

## 2023-09-27 ENCOUNTER — Encounter: Payer: Self-pay | Admitting: Cardiology

## 2023-09-27 ENCOUNTER — Ambulatory Visit: Payer: Medicare HMO | Admitting: Cardiology

## 2023-09-27 VITALS — BP 116/64 | HR 56 | Ht 64.5 in | Wt 174.8 lb

## 2023-09-27 DIAGNOSIS — I10 Essential (primary) hypertension: Secondary | ICD-10-CM

## 2023-09-27 DIAGNOSIS — R600 Localized edema: Secondary | ICD-10-CM | POA: Diagnosis not present

## 2023-09-27 DIAGNOSIS — I272 Pulmonary hypertension, unspecified: Secondary | ICD-10-CM | POA: Diagnosis not present

## 2023-09-27 DIAGNOSIS — G4733 Obstructive sleep apnea (adult) (pediatric): Secondary | ICD-10-CM

## 2023-09-27 DIAGNOSIS — I4819 Other persistent atrial fibrillation: Secondary | ICD-10-CM

## 2023-09-27 MED ORDER — FLECAINIDE ACETATE 100 MG PO TABS: 100.0000 mg | ORAL_TABLET | Freq: Two times a day (BID) | ORAL | 3 refills | Status: AC

## 2023-09-27 MED ORDER — FUROSEMIDE 20 MG PO TABS: 20.0000 mg | ORAL_TABLET | Freq: Every day | ORAL | 3 refills | Status: AC | PRN

## 2023-09-27 MED ORDER — LOSARTAN POTASSIUM 25 MG PO TABS
25.0000 mg | ORAL_TABLET | Freq: Every day | ORAL | 3 refills | Status: AC
Start: 2023-09-27 — End: ?

## 2023-09-27 MED ORDER — DILTIAZEM HCL ER COATED BEADS 120 MG PO CP24: 120.0000 mg | ORAL_CAPSULE | Freq: Every day | ORAL | 3 refills | Status: AC

## 2023-09-27 NOTE — Addendum Note (Signed)
 Addended by: Erick Alley on: 09/27/2023 02:23 PM   Modules accepted: Orders

## 2023-09-27 NOTE — Patient Instructions (Addendum)
 Medication Instructions:  Decrease Losartan to 25 mg once daily *If you need a refill on your cardiac medications before your next appointment, please call your pharmacy*  Lab Work: BMET in 1 week If you have labs (blood work) drawn today and your tests are completely normal, you will receive your results only by: MyChart Message (if you have MyChart) OR A paper copy in the mail If you have any lab test that is abnormal or we need to change your treatment, we will call you to review the results.  Testing/Procedures: NONE  Follow-Up: At [facility], you and your health needs are our priority.  As part of our continuing mission to provide you with exceptional heart care, our providers are all part of one team.  This team includes your primary Cardiologist (physician) and Advanced Practice Providers or APPs (Physician Assistants and Nurse Practitioners) who all work together to provide you with the care you need, when you need it.  Your next appointment:   6 month(s)  Provider:   Armanda Magic, MD     We recommend signing up for the patient portal called "MyChart".  Sign up information is provided on this After Visit Summary.  MyChart is used to connect with patients for Virtual Visits (Telemedicine).  Patients are able to view lab/test results, encounter notes, upcoming appointments, etc.  Non-urgent messages can be sent to your provider as well.   To learn more about what you can do with MyChart, go to [email or web address].   Other Instructions Check blood pressure 1 hour after meds for 1 week and call us with readings or send them via MyChart.      1st Floor: - Lobby - Registration  - Pharmacy  - Lab - Cafe  2nd Floor: - PV Lab - Diagnostic Testing (echo, CT, nuclear med)  3rd Floor: - Vacant  4th Floor: - TCTS (cardiothoracic surgery) - AFib Clinic - Structural Heart Clinic - Vascular Surgery  - Vascular Ultrasound  5th Floor: - [facility]  (general and EP) - Clinical Pharmacy for coumadin, hypertension, lipid, weight-loss medications, and med management appointments    Valet parking services will be available as well.

## 2023-09-27 NOTE — Progress Notes (Addendum)
 Date:  09/27/2023   ID:  Erin Alvarez, DOB 1945-10-10, MRN [id number] The patient was identified using 2 identifiers.  PCP:  Alysia Penna, MD   [facility] Providers Cardiologist:  Armanda Magic, MD     Evaluation Performed:  Follow-Up Visit  Chief Complaint:  PAF, OSA, HTN  History of Present Illness:    Erin Alvarez is a 78 y.o. female with a hx of atypical CP and subsequent stress echo with no ischemia, rheumatic fever as a child,  grade 2 diastolic dysfunction by echo and persistent atrial fibrillation (first diagnosed in 2013) and has been on Eliquis for a CHADS2Vasc score of 3, Flecainide and Cardizem. She has severe OSA with an initial AHI of 29/hr on her sleep study and is on CPAP and last OV her PAP was decreased to 4-10cm H2O due to dry mouth.   She is here today for followup and is doing well.  Sometimes when she is in the care talking she feels like she has to take a deep breath in.  She denies any chest pain or pressure, DOE, PND, orthopnea, dizziness, palpitations or syncope. She still has some LE edema at times and will take Lasix 40mg  PRN which resolves it. She is compliant with her meds and is tolerating meds with no SE.    She is doing well with her PAP device and thinks that she has gotten used to it.  She tolerates the mask and feels the pressure is adequate.  Since going on PAP she feels rested in the am and has no significant daytime sleepiness.  She denies any significant mouth or nasal dryness or nasal congestion.  She does not think that he snores.    Past Medical History:  Diagnosis Date   Allergic rhinitis, cause unspecified    Allergy    Cataract    Disorder of bone and cartilage, unspecified    Diverticulosis of colon (without mention of hemorrhage)    Elbow fracture, right 05/2011   Essential hypertension, benign    Family history of malignant neoplasm of breast    H/O: rheumatic fever    Heart murmur    dx as child - s/p rheumatic fever    Leukocytopenia, unspecified    Lung nodule 06/2009   CT of chest, 4 mm subpleural nodule in the superior segment of the right lower lobe   Mild mitral regurgitation    Mild tricuspid regurgitation    OSA (obstructive sleep apnea)    Severe w AHI 29/hr and successful CPAP now on auto CPAP   Osteopenia    Osteoporosis    Persistent atrial fibrillation (HCC) 05/04/2012   on systemic anticoagulation w Xarelto for CHADS VASC score of 3   Pneumonia, organism unspecified(486)    Pulmonary HTN (HCC)    Sleep apnea    cpap   Unspecified sinusitis (chronic)    Past Surgical History:  Procedure Laterality Date   BREAST BIOPSY Left    biopsy - benign   DILATION AND CURETTAGE OF UTERUS     FOOT SURGERY Left    TONSILLECTOMY     TUBAL LIGATION       Current Meds  Medication Sig   Cholecalciferol (VITAMIN D3) 50 MCG (2000 UT) TABS Take 50 mcg by mouth daily.   denosumab (PROLIA) 60 MG/ML SOSY injection Inject 60 mg into the skin every 6 (six) months.   diltiazem (CARDIZEM CD) 120 MG 24 hr capsule TAKE 1 CAPSULE DAILY  ELIQUIS 5 MG TABS tablet TAKE 1 TABLET TWICE A DAY   estradiol (ESTRACE) 0.1 MG/GM vaginal cream Place 1 Applicatorful vaginally once a week.   flecainide (TAMBOCOR) 100 MG tablet TAKE 1 TABLET TWICE A DAY   furosemide (LASIX) 20 MG tablet Take 20 mg by mouth daily as needed for edema.   Guselkumab (TREMFYA) 100 MG/ML SOSY Inject into the skin every 6 (six) weeks.   losartan (COZAAR) 50 MG tablet Take 1.5 tablets (75 mg total) by mouth daily.   Magnesium 400 MG CAPS Take 400 mg by mouth daily.   mometasone (NASONEX) 50 MCG/ACT nasal spray Place 2 sprays into the nose daily as needed (allergies).   Multiple Vitamins-Minerals (CENTRUM PO) Take 1 tablet by mouth daily.     NON FORMULARY CPAP at bedtime   Omega-3 Fatty Acids (FISH OIL) 1200 MG CAPS Take 2 capsules by mouth daily.     potassium chloride SA (KLOR-CON M20) 20 MEQ tablet Take 1 tablet (20 mEq total) by mouth as  needed. Take when taking lasix only.   spironolactone (ALDACTONE) 25 MG tablet Take 1 tablet (25 mg total) by mouth daily.     Allergies:   Ciprofloxacin and Naproxen   Social History   Tobacco Use   Smoking status: Former    Current packs/day: 0.00    Average packs/day: 2.0 packs/day for 15.0 years (30.0 ttl pk-yrs)    Types: Cigarettes    Start date: 06/23/1959    Quit date: 06/22/1974    Years since quitting: 49.2   Smokeless tobacco: Never  Vaping Use   Vaping status: Never Used  Substance Use Topics   Alcohol use: Yes    Alcohol/week: 0.0 standard drinks of alcohol    Comment: rare wine   Drug use: No     Family Hx: The patient's family history includes Brain cancer in her mother; Colon cancer in her maternal aunt; Emphysema in her father; Heart disease (age of onset: 92) in her mother; Hyperlipidemia in her brother and another family member; Hypertension in her brother, father, and another family member; Lung cancer in her mother. There is no history of Stomach cancer or Rectal cancer.  ROS:   Please see the history of present illness.     All other systems reviewed and are negative.   Prior CV studies:   The following studies were reviewed today:  EKG Interpretation Date/Time:  Monday September 27 2023 13:52:13 EDT Ventricular Rate:  54 PR Interval:  226 QRS Duration:  122 QT Interval:  456 QTC Calculation: 432 R Axis:   -74  Text Interpretation: Sinus bradycardia with 1st degree A-V block Left anterior fascicular block Minimal voltage criteria for LVH, may be normal variant ( Cornell product ) Possible Anterolateral infarct , age undetermined Confirmed by Armanda Magic ([id number]) on 09/27/2023 2:02:18 PM   PAP compliance download  Labs/Other Tests and Data Reviewed:     Recent Labs: 03/01/2023: Hemoglobin 14.1; Platelets 248   Recent Lipid Panel Lab Results  Component Value Date/Time   CHOL 169 07/03/2010 08:29 PM   TRIG 52 07/03/2010 08:29 PM   HDL 72 07/03/2010  08:29 PM   CHOLHDL 2.3 Ratio 07/03/2010 08:29 PM   LDLCALC 87 07/03/2010 08:29 PM    Wt Readings from Last 3 Encounters:  09/27/23 174 lb 12.8 oz (79.3 kg)  11/26/22 179 lb (81.2 kg)  10/26/22 179 lb (81.2 kg)     Risk Assessment/Calculations:    CHA2DS2-VASc Score = 4  This indicates a 4.8% annual risk of stroke. The patient's score is based upon: CHF History: 0 HTN History: 1 Diabetes History: 0 Stroke History: 0 Vascular Disease History: 0 Age Score: 2 Gender Score: 1          Objective   Vital Signs:  BP 116/64   Pulse (!) 56   Ht 5' 4.5" (1.638 m)   Wt 174 lb 12.8 oz (79.3 kg)   SpO2 93%   BMI 29.54 kg/m   GEN: Well nourished, well developed in no acute distress HEENT: Normal NECK: No JVD; No carotid bruits LYMPHATICS: No lymphadenopathy CARDIAC:RRR, no murmurs, rubs, gallops RESPIRATORY:  Clear to auscultation without rales, wheezing or rhonchi  ABDOMEN: Soft, non-tender, non-distended MUSCULOSKELETAL:  No edema; No deformity  SKIN: Warm and dry NEUROLOGIC:  Alert and oriented x 3 PSYCHIATRIC:  Normal affect  ASSESSMENT & PLAN:    #Persistent atrial fibrillation -She is maintaining normal sinus rhythm on exam -She denies any bleeding problems on DOAC or palpitations -Continue prescription drug management with Cardizem CD 120 mg daily, flecainide 100 mg twice daily, Eliquis 5 mg twice daily with as needed refills -I have personally reviewed and interpreted outside labs performed by patient's PCP which showed Hbg 14.3, SCr 0.89 and K+ 5.5 -EKG done 10/26/22 showed chronic right bundle branch block with QRS duration of -EKG today showed QRS of  -stop potassium that she takes PRN when taking Lasix -repeat BMET in 1 week off potassium   #HTN -BP is controlled on exam today -Continue prescription drug management with Cardizem CD 120 mg daily and spironolactone 25 mg daily with as needed refills -her HR is slow and she is concerned.  Her BP is  well controlled.  Will decrease Losartan to 25mg  daily. -I have asked her to check her BP daily for a week and call  #Pulmonary HTN -this was resolved on last 2D echo   #LE edema -This remains well-controlled with diuretic use -Continue prescription drug management with Lasix 40 mg daily as needed for lower extremity edema -I have personally reviewed and interpreted outside labs performed by patient's PCP which showed serum creatinine 0.86 and potassium 4.2 on 06/11/2022   #OSA - The patient is tolerating PAP therapy well without any problems. The PAP download performed by his DME was personally reviewed and interpreted by me today and showed an AHI of 2.8 /hr on 8 cm H2O with 100% compliance in using more than 4 hours nightly.  The patient has been using and benefiting from PAP use and will continue to benefit from therapy.   Followup with me in 6 months   Medication Adjustments/Labs and Tests Ordered: Current medicines are reviewed at length with the patient today.  Concerns regarding medicines are outlined above.   Tests Ordered: Orders Placed This Encounter  Procedures   EKG 12-Lead    Medication Changes: No orders of the defined types were placed in this encounter.   Follow Up: 2 months virtual for followup of CPAP changes  Signed, Armanda Magic, MD  09/27/2023 1:59 PM    [facility]

## 2023-09-29 ENCOUNTER — Other Ambulatory Visit: Payer: Self-pay | Admitting: Cardiology

## 2023-10-04 ENCOUNTER — Telehealth: Payer: Self-pay | Admitting: Cardiology

## 2023-10-04 NOTE — Telephone Encounter (Signed)
 Pt c/o medication issue:  1. Name of Medication: furosemide (LASIX) 20 MG tablet   2. How are you currently taking this medication (dosage and times per day)?   3. Are you having a reaction (difficulty breathing--STAT)? No  4. What is your medication issue? [facility] calling to clarify sig, requesting cb ref #[id number]

## 2023-10-04 NOTE — Telephone Encounter (Signed)
 [facility] and gave [facility] clarification for furosemide 20 mg can take 1-2 tablets  daily as needed for lower leg edema.

## 2023-10-06 DIAGNOSIS — I10 Essential (primary) hypertension: Secondary | ICD-10-CM | POA: Diagnosis not present

## 2023-10-06 LAB — BASIC METABOLIC PANEL WITH GFR
BUN/Creatinine Ratio: 14 (ref 12–28)
BUN: 13 mg/dL (ref 8–27)
CO2: 26 mmol/L (ref 20–29)
Calcium: 9.8 mg/dL (ref 8.7–10.3)
Chloride: 102 mmol/L (ref 96–106)
Creatinine, Ser: 0.95 mg/dL (ref 0.57–1.00)
Glucose: 94 mg/dL (ref 70–99)
Potassium: 4.8 mmol/L (ref 3.5–5.2)
Sodium: 142 mmol/L (ref 134–144)
eGFR: 61 mL/min/{1.73_m2} (ref >59)

## 2023-10-07 ENCOUNTER — Encounter: Payer: Self-pay | Admitting: Cardiology

## 2023-11-18 DIAGNOSIS — G4733 Obstructive sleep apnea (adult) (pediatric): Secondary | ICD-10-CM | POA: Diagnosis not present

## 2023-11-26 DIAGNOSIS — Z1231 Encounter for screening mammogram for malignant neoplasm of breast: Secondary | ICD-10-CM | POA: Diagnosis not present

## 2023-12-03 ENCOUNTER — Telehealth: Payer: Self-pay | Admitting: Cardiology

## 2023-12-03 MED ORDER — LOSARTAN POTASSIUM 25 MG PO TABS: 25.0000 mg | ORAL_TABLET | Freq: Every day | ORAL | 2 refills | Status: AC

## 2023-12-03 NOTE — Telephone Encounter (Signed)
*  STAT* If patient is at the pharmacy, call can be transferred to refill team.   1. Which medications need to be refilled? (please list name of each medication and dose if known) losartan  (COZAAR ) 25 MG tablet    2. Would you like to learn more about the convenience, safety, & potential cost savings by using the [facility]?      3. Are you open to using the [facility] (Type [facility].  ).   4. Which pharmacy/location (including street and city if local pharmacy) is medication to be sent to? [facility] - [place], [place] - [address] [facility] AT Portal to [facility]    5. Do they need a 30 day or 90 day supply? 90 day

## 2023-12-03 NOTE — Telephone Encounter (Signed)
 Pt's medication was sent to pt's pharmacy as requested. Confirmation received.

## 2023-12-20 DIAGNOSIS — R051 Acute cough: Secondary | ICD-10-CM | POA: Diagnosis not present

## 2023-12-20 DIAGNOSIS — J060 Acute laryngopharyngitis: Secondary | ICD-10-CM | POA: Diagnosis not present

## 2023-12-20 DIAGNOSIS — J040 Acute laryngitis: Secondary | ICD-10-CM | POA: Diagnosis not present

## 2024-02-16 DIAGNOSIS — G4733 Obstructive sleep apnea (adult) (pediatric): Secondary | ICD-10-CM | POA: Diagnosis not present

## 2024-02-17 ENCOUNTER — Telehealth: Payer: Self-pay | Admitting: Cardiology

## 2024-02-17 DIAGNOSIS — R001 Bradycardia, unspecified: Secondary | ICD-10-CM

## 2024-02-17 NOTE — Telephone Encounter (Signed)
 STAT if HR is under 50 or over 120  (normal HR is 60-100 beats per minute)  What is your heart rate? 48  Do you have a log of your heart rate readings (document readings)? 52 50 48 51   Do you have any other symptoms? No

## 2024-02-17 NOTE — Telephone Encounter (Signed)
 Call transferred into triage.  Patient is at home with home health nurse.  The pt reports to nurse that she is being lazy.  The nurse discussed w her and pt is feeling no energy, fatigued.  To me, the patient reports that she feels fine, the way she always feels, no concerns, and states that her HR always drops into 40s and goes up into 60s with activity.    Discussed that she is on Cardizem  and Flecainide  for afib and the possibility that flecainide  could be reduced.  She is aware I will forward to Dr. Shlomo and we will call the patient back with any recommendations.

## 2024-02-18 ENCOUNTER — Ambulatory Visit: Attending: Cardiology

## 2024-02-18 DIAGNOSIS — R001 Bradycardia, unspecified: Secondary | ICD-10-CM

## 2024-02-18 NOTE — Telephone Encounter (Signed)
 From Dr. Shlomo: Please get a 3 day ziopatch to assess average heart rate.   Called and let patient know of the recommendation.  She is in agreement and patient will apply 3 day Zio when it comes or after she is back from out of town on 9/7.

## 2024-02-18 NOTE — Progress Notes (Unsigned)
 Enrolled for [employer] to mail a ZIO XT long term holter monitor to the patients address on file.

## 2024-03-03 ENCOUNTER — Ambulatory Visit: Payer: Self-pay | Admitting: Cardiology

## 2024-03-03 DIAGNOSIS — R001 Bradycardia, unspecified: Secondary | ICD-10-CM | POA: Diagnosis not present

## 2024-03-17 DIAGNOSIS — M5136 Other intervertebral disc degeneration, lumbar region with discogenic back pain only: Secondary | ICD-10-CM | POA: Diagnosis not present

## 2024-03-17 DIAGNOSIS — M7061 Trochanteric bursitis, right hip: Secondary | ICD-10-CM | POA: Diagnosis not present

## 2024-03-17 DIAGNOSIS — M1611 Unilateral primary osteoarthritis, right hip: Secondary | ICD-10-CM | POA: Diagnosis not present

## 2024-03-17 DIAGNOSIS — M1711 Unilateral primary osteoarthritis, right knee: Secondary | ICD-10-CM | POA: Diagnosis not present

## 2024-04-20 DIAGNOSIS — H43813 Vitreous degeneration, bilateral: Secondary | ICD-10-CM | POA: Diagnosis not present

## 2024-04-20 DIAGNOSIS — H52203 Unspecified astigmatism, bilateral: Secondary | ICD-10-CM | POA: Diagnosis not present

## 2024-04-20 DIAGNOSIS — H5203 Hypermetropia, bilateral: Secondary | ICD-10-CM | POA: Diagnosis not present

## 2024-04-20 DIAGNOSIS — H04123 Dry eye syndrome of bilateral lacrimal glands: Secondary | ICD-10-CM | POA: Diagnosis not present

## 2024-04-20 DIAGNOSIS — H2513 Age-related nuclear cataract, bilateral: Secondary | ICD-10-CM | POA: Diagnosis not present

## 2024-04-20 DIAGNOSIS — H25013 Cortical age-related cataract, bilateral: Secondary | ICD-10-CM | POA: Diagnosis not present

## 2024-04-28 DIAGNOSIS — Z0100 Encounter for examination of eyes and vision without abnormal findings: Secondary | ICD-10-CM | POA: Diagnosis not present

## 2024-05-12 DIAGNOSIS — G4733 Obstructive sleep apnea (adult) (pediatric): Secondary | ICD-10-CM | POA: Diagnosis not present

## 2024-05-30 DIAGNOSIS — M810 Age-related osteoporosis without current pathological fracture: Secondary | ICD-10-CM | POA: Diagnosis not present

## 2024-05-30 DIAGNOSIS — L409 Psoriasis, unspecified: Secondary | ICD-10-CM | POA: Diagnosis not present

## 2024-05-30 DIAGNOSIS — Z0000 Encounter for general adult medical examination without abnormal findings: Secondary | ICD-10-CM | POA: Diagnosis not present

## 2024-05-30 DIAGNOSIS — D692 Other nonthrombocytopenic purpura: Secondary | ICD-10-CM | POA: Diagnosis not present

## 2024-05-30 DIAGNOSIS — Z1339 Encounter for screening examination for other mental health and behavioral disorders: Secondary | ICD-10-CM | POA: Diagnosis not present

## 2024-05-30 DIAGNOSIS — I10 Essential (primary) hypertension: Secondary | ICD-10-CM | POA: Diagnosis not present

## 2024-05-30 DIAGNOSIS — D84821 Immunodeficiency due to drugs: Secondary | ICD-10-CM | POA: Diagnosis not present

## 2024-05-30 DIAGNOSIS — G4730 Sleep apnea, unspecified: Secondary | ICD-10-CM | POA: Diagnosis not present

## 2024-05-30 DIAGNOSIS — I272 Pulmonary hypertension, unspecified: Secondary | ICD-10-CM | POA: Diagnosis not present

## 2024-05-30 DIAGNOSIS — K5900 Constipation, unspecified: Secondary | ICD-10-CM | POA: Diagnosis not present

## 2024-05-30 DIAGNOSIS — Z23 Encounter for immunization: Secondary | ICD-10-CM | POA: Diagnosis not present

## 2024-05-30 DIAGNOSIS — R600 Localized edema: Secondary | ICD-10-CM | POA: Diagnosis not present

## 2024-05-30 DIAGNOSIS — I4891 Unspecified atrial fibrillation: Secondary | ICD-10-CM | POA: Diagnosis not present

## 2024-05-30 DIAGNOSIS — D6869 Other thrombophilia: Secondary | ICD-10-CM | POA: Diagnosis not present

## 2024-05-30 DIAGNOSIS — Z1331 Encounter for screening for depression: Secondary | ICD-10-CM | POA: Diagnosis not present

## 2024-06-01 DIAGNOSIS — H10412 Chronic giant papillary conjunctivitis, left eye: Secondary | ICD-10-CM | POA: Diagnosis not present

## 2024-07-17 ENCOUNTER — Other Ambulatory Visit: Payer: Self-pay | Admitting: Cardiology

## 2024-07-17 DIAGNOSIS — I4819 Other persistent atrial fibrillation: Secondary | ICD-10-CM

## 2024-07-18 NOTE — Telephone Encounter (Signed)
 Eliquis  5mg  refill request received. Patient is 79 years old, weight-79.3kg, Crea-0.95 on 10/06/23, Diagnosis-Afib, and last seen by Dr. Shlomo on 09/27/23. Dose is appropriate based on dosing criteria. Will send in refill to requested pharmacy.
